# Patient Record
Sex: Female | Born: 1960 | Race: White | Hispanic: No | Marital: Married | Smoking: Former smoker
Health system: Southern US, Community
[De-identification: ages and names within clinical notes are randomized; demographics above are authoritative.]

## PROBLEM LIST (undated history)

## (undated) DIAGNOSIS — N920 Excessive and frequent menstruation with regular cycle: Secondary | ICD-10-CM

## (undated) DIAGNOSIS — J45909 Unspecified asthma, uncomplicated: Secondary | ICD-10-CM

## (undated) DIAGNOSIS — M810 Age-related osteoporosis without current pathological fracture: Secondary | ICD-10-CM

## (undated) DIAGNOSIS — I471 Supraventricular tachycardia, unspecified: Secondary | ICD-10-CM

## (undated) DIAGNOSIS — I480 Paroxysmal atrial fibrillation: Secondary | ICD-10-CM

## (undated) HISTORY — DX: Excessive and frequent menstruation with regular cycle: N92.0

## (undated) HISTORY — DX: Paroxysmal atrial fibrillation: I48.0

## (undated) HISTORY — DX: Supraventricular tachycardia: I47.1

## (undated) HISTORY — DX: Supraventricular tachycardia, unspecified: I47.10

## (undated) HISTORY — DX: Age-related osteoporosis without current pathological fracture: M81.0

## (undated) HISTORY — DX: Unspecified asthma, uncomplicated: J45.909

---

## 2000-08-29 ENCOUNTER — Other Ambulatory Visit: Admission: RE | Admit: 2000-08-29 | Discharge: 2000-08-29 | Payer: Self-pay | Admitting: Obstetrics and Gynecology

## 2002-09-09 ENCOUNTER — Other Ambulatory Visit: Admission: RE | Admit: 2002-09-09 | Discharge: 2002-09-09 | Payer: Self-pay | Admitting: Obstetrics and Gynecology

## 2005-01-11 ENCOUNTER — Emergency Department (HOSPITAL_COMMUNITY): Admission: RE | Admit: 2005-01-11 | Discharge: 2005-01-11 | Payer: Self-pay | Admitting: Family Medicine

## 2005-03-07 ENCOUNTER — Emergency Department (HOSPITAL_COMMUNITY): Admission: EM | Admit: 2005-03-07 | Discharge: 2005-03-07 | Payer: Self-pay | Admitting: Family Medicine

## 2005-05-15 ENCOUNTER — Other Ambulatory Visit: Admission: RE | Admit: 2005-05-15 | Discharge: 2005-05-15 | Payer: Self-pay | Admitting: Obstetrics and Gynecology

## 2006-05-16 ENCOUNTER — Other Ambulatory Visit: Admission: RE | Admit: 2006-05-16 | Discharge: 2006-05-16 | Payer: Self-pay | Admitting: Obstetrics and Gynecology

## 2007-11-13 ENCOUNTER — Other Ambulatory Visit: Admission: RE | Admit: 2007-11-13 | Discharge: 2007-11-13 | Payer: Self-pay | Admitting: Obstetrics and Gynecology

## 2009-07-06 ENCOUNTER — Other Ambulatory Visit: Admission: RE | Admit: 2009-07-06 | Discharge: 2009-07-06 | Payer: Self-pay | Admitting: Obstetrics and Gynecology

## 2009-07-06 ENCOUNTER — Ambulatory Visit: Payer: Self-pay | Admitting: Gynecology

## 2010-12-29 ENCOUNTER — Encounter (INDEPENDENT_AMBULATORY_CARE_PROVIDER_SITE_OTHER): Payer: BC Managed Care – PPO | Admitting: Women's Health

## 2010-12-29 DIAGNOSIS — N951 Menopausal and female climacteric states: Secondary | ICD-10-CM

## 2010-12-29 DIAGNOSIS — F329 Major depressive disorder, single episode, unspecified: Secondary | ICD-10-CM

## 2011-06-20 ENCOUNTER — Ambulatory Visit (INDEPENDENT_AMBULATORY_CARE_PROVIDER_SITE_OTHER): Payer: BC Managed Care – PPO | Admitting: Women's Health

## 2011-06-20 ENCOUNTER — Encounter: Payer: Self-pay | Admitting: Women's Health

## 2011-06-20 ENCOUNTER — Other Ambulatory Visit (HOSPITAL_COMMUNITY)
Admission: RE | Admit: 2011-06-20 | Discharge: 2011-06-20 | Disposition: A | Discharge status: Home / Self Care | Payer: BC Managed Care – PPO | Source: Ambulatory Visit | Attending: Women's Health | Admitting: Women's Health

## 2011-06-20 VITALS — BP 130/70 | Ht 66.0 in | Wt 163.0 lb

## 2011-06-20 DIAGNOSIS — R823 Hemoglobinuria: Secondary | ICD-10-CM

## 2011-06-20 DIAGNOSIS — E079 Disorder of thyroid, unspecified: Secondary | ICD-10-CM

## 2011-06-20 DIAGNOSIS — Z01419 Encounter for gynecological examination (general) (routine) without abnormal findings: Secondary | ICD-10-CM | POA: Insufficient documentation

## 2011-06-20 DIAGNOSIS — Z13220 Encounter for screening for lipoid disorders: Secondary | ICD-10-CM

## 2011-06-20 DIAGNOSIS — Z833 Family history of diabetes mellitus: Secondary | ICD-10-CM

## 2011-06-20 DIAGNOSIS — Z124 Encounter for screening for malignant neoplasm of cervix: Secondary | ICD-10-CM

## 2011-06-20 NOTE — Progress Notes (Signed)
Alexandra Grant [birth year] [id number]    History:    The patient presents for annual exam.  Mother had a stint put in her heart yesterday due to chest pains and blockage discharge home today and doing better.   Past medical history, past surgical history, family history and social history were all reviewed and documented in the EPIC chart.   ROS:  A  ROS was performed and pertinent positives and negatives are included in the history.  Exam:  Filed Vitals:   06/20/11 1446  BP: 130/70    General appearance:  Normal Head/Neck:  Normal, without cervical or supraclavicular adenopathy. Thyroid:  Symmetrical, normal in size, without palpable masses or nodularity. Respiratory  Effort:  Normal  Auscultation:  Clear without wheezing or rhonchi Cardiovascular  Auscultation:  Regular rate, without rubs, murmurs or gallops  Edema/varicosities:  Not grossly evident Abdominal  Soft,nontender, without masses, guarding or rebound.  Liver/spleen:  No organomegaly noted  Hernia:  None appreciated  Skin  Inspection:  Grossly normal  Palpation:  Grossly normal Neurologic/psychiatric  Orientation:  Normal with appropriate conversation.  Mood/affect:  Normal  Genitourinary    Breasts: Examined lying and sitting.     Right: Without masses, retractions, discharge or axillary adenopathy.     Left: Without masses, retractions, discharge or axillary adenopathy.   Inguinal/mons:  Normal without inguinal adenopathy  External genitalia:  Normal  BUS/Urethra/Skene's glands:  Normal  Bladder:  Normal  Vagina:  Normal  Cervix:  Normal  Uterus:   normal in size, shape and contour.  Midline and mobile  Adnexa/parametria:     Rt: Without masses or tenderness.   Lt: Without masses or tenderness.  Anus and perineum: Normal  Digital rectal exam: Normal sphincter tone without palpated masses or tenderness  Assessment/Plan:  50 y.o. MWF G2P2 for annual exam mostly monthly 5 day cycles/vasectomy.  States  has had some irregular cycles but unable to define, instructed to keep a menstrual calendar. Last mammogram approximately 5 years ago she states normal did review importance of annual screening. Has had some problems with anxiety and panic this past year. States drinks alcohol most days to help her relax.  Uses Xanax 0.25 sparingly has used only one prescription in the last 6 months.   Perimenopausal Anxiety/ depression/questionable alcohol abuse  Plan: Menstrual calendar, call if cycles are greater than 60 days or less than 21 days or any bleeding between cycles. Marland Kitchen SBEs, annual mammogram instructed to schedule. Counseling for anxiety and depression recommended, Berniece Andreas name and number was given for counseling or. Encouraged increase leisure and continue exercise. Xanax 0.25 every 8 hours when necessary #30 no refills, reviewed importance of using sparingly, it is addictive. Reviewed risks of daily drinking. Vitamin D 1000 daily and calcium rich diet encouraged.  CBC, glucose, lipid profile, UA, Pap. Harrington Challenger [facility], 3:27 PM 06/20/2011

## 2011-06-28 ENCOUNTER — Other Ambulatory Visit: Payer: Self-pay | Admitting: Gynecology

## 2011-06-28 DIAGNOSIS — E039 Hypothyroidism, unspecified: Secondary | ICD-10-CM

## 2011-06-29 LAB — TSH: TSH: 0.519 u[IU]/mL (ref 0.350–4.500)

## 2012-05-30 ENCOUNTER — Telehealth: Payer: Self-pay | Admitting: *Deleted

## 2012-05-30 MED ORDER — ALPRAZOLAM 0.25 MG PO TABS: 0.2500 mg | ORAL_TABLET | ORAL | Status: DC | PRN

## 2012-05-30 NOTE — Telephone Encounter (Signed)
Pt called requesting new Rx for Xanax 0.25 mg given last year at annual. Okay to fill?

## 2012-05-30 NOTE — Telephone Encounter (Signed)
Pt informed rx called in

## 2012-05-30 NOTE — Telephone Encounter (Signed)
Please call in Xanax 0.25 when necessary #30, 1 refill. Thanks

## 2012-07-18 ENCOUNTER — Ambulatory Visit (INDEPENDENT_AMBULATORY_CARE_PROVIDER_SITE_OTHER): Payer: BC Managed Care – PPO | Admitting: Gynecology

## 2012-07-18 ENCOUNTER — Encounter: Payer: Self-pay | Admitting: Gynecology

## 2012-07-18 VITALS — BP 128/80

## 2012-07-18 DIAGNOSIS — N390 Urinary tract infection, site not specified: Secondary | ICD-10-CM

## 2012-07-18 DIAGNOSIS — R300 Dysuria: Secondary | ICD-10-CM

## 2012-07-18 LAB — URINALYSIS W MICROSCOPIC + REFLEX CULTURE
Bilirubin Urine: NEGATIVE
Casts: NONE SEEN
Crystals: NONE SEEN
Glucose, UA: NEGATIVE mg/dL
Ketones, ur: NEGATIVE mg/dL
Nitrite: NEGATIVE
Protein, ur: NEGATIVE mg/dL
Specific Gravity, Urine: 1.01 (ref 1.005–1.030)
Urobilinogen, UA: 0.2 mg/dL (ref 0.0–1.0)
pH: 5.5 (ref 5.0–8.0)

## 2012-07-18 MED ORDER — NITROFURANTOIN MONOHYD MACRO 100 MG PO CAPS: 100.0000 mg | ORAL_CAPSULE | Freq: Two times a day (BID) | ORAL | Status: DC

## 2012-07-18 NOTE — Patient Instructions (Addendum)
Urinary Tract Infection Urinary tract infections (UTIs) can develop anywhere along your urinary tract. Your urinary tract is your body's drainage system for removing wastes and extra water. Your urinary tract includes two kidneys, two ureters, a bladder, and a urethra. Your kidneys are a pair of bean-shaped organs. Each kidney is about the size of your fist. They are located below your ribs, one on each side of your spine. CAUSES Infections are caused by microbes, which are microscopic organisms, including fungi, viruses, and bacteria. These organisms are so small that they can only be seen through a microscope. Bacteria are the microbes that most commonly cause UTIs. SYMPTOMS  Symptoms of UTIs may vary by age and gender of the patient and by the location of the infection. Symptoms in young women typically include a frequent and intense urge to urinate and a painful, burning feeling in the bladder or urethra during urination. Older women and men are more likely to be tired, shaky, and weak and have muscle aches and abdominal pain. A fever may mean the infection is in your kidneys. Other symptoms of a kidney infection include pain in your back or sides below the ribs, nausea, and vomiting. DIAGNOSIS To diagnose a UTI, your caregiver will ask you about your symptoms. Your caregiver also will ask to provide a urine sample. The urine sample will be tested for bacteria and white blood cells. White blood cells are made by your body to help fight infection. TREATMENT  Typically, UTIs can be treated with medication. Because most UTIs are caused by a bacterial infection, they usually can be treated with the use of antibiotics. The choice of antibiotic and length of treatment depend on your symptoms and the type of bacteria causing your infection. HOME CARE INSTRUCTIONS  If you were prescribed antibiotics, take them exactly as your caregiver instructs you. Finish the medication even if you feel better after you  have only taken some of the medication.  Drink enough water and fluids to keep your urine clear or pale yellow.  Avoid caffeine, tea, and carbonated beverages. They tend to irritate your bladder.  Empty your bladder often. Avoid holding urine for long periods of time.  Empty your bladder before and after sexual intercourse.  After a bowel movement, women should cleanse from front to back. Use each tissue only once. SEEK MEDICAL CARE IF:   You have back pain.  You develop a fever.  Your symptoms do not begin to resolve within 3 days. SEEK IMMEDIATE MEDICAL CARE IF:   You have severe back pain or lower abdominal pain.  You develop chills.  You have nausea or vomiting.  You have continued burning or discomfort with urination. MAKE SURE YOU:   Understand these instructions.  Will watch your condition.  Will get help right away if you are not doing well or get worse. Document Released: 05/09/2005 Document Revised: 01/29/2012 Document Reviewed: 09/07/2011 [employer] Patient Information 2013 [employer].  

## 2012-07-18 NOTE — Progress Notes (Signed)
Patient presented to the office today complaining of a few days of dysuria frequency. Patient denied fever chills nausea or vomiting. Patient denied vaginal discharge.  Exam: Back: No CVA tenderness Abdomen: Soft nontender no rebound or guarding Pelvic not done  Urinalysis demonstrated 21-15 white blood cells and clumps, few bacteria, 3-6 rbc's.  Assessment/plan: Clinical evidence of urinary tract infection. Patient will be started on Macrobid one by mouth twice a day for 7 days. She was also given samples of Uribell anti-spasmodic agent to take 1 by mouth 4 times a day for 2 days. She is encouraged to increase her fluid intake. If she develops any back pain fever chills nausea vomiting months after hours she will report to the emergency room.

## 2012-07-22 LAB — URINE CULTURE

## 2012-07-23 ENCOUNTER — Other Ambulatory Visit: Payer: Self-pay | Admitting: Dermatology

## 2012-07-29 ENCOUNTER — Encounter: Payer: Self-pay | Admitting: Women's Health

## 2012-08-13 DIAGNOSIS — N920 Excessive and frequent menstruation with regular cycle: Secondary | ICD-10-CM

## 2012-08-13 HISTORY — DX: Excessive and frequent menstruation with regular cycle: N92.0

## 2012-09-01 ENCOUNTER — Encounter: Payer: BC Managed Care – PPO | Admitting: Women's Health

## 2012-09-19 ENCOUNTER — Encounter: Payer: Self-pay | Admitting: Women's Health

## 2012-09-19 ENCOUNTER — Ambulatory Visit (INDEPENDENT_AMBULATORY_CARE_PROVIDER_SITE_OTHER): Payer: BC Managed Care – PPO | Admitting: Women's Health

## 2012-09-19 VITALS — BP 124/80 | Ht 66.0 in | Wt 159.0 lb

## 2012-09-19 DIAGNOSIS — E079 Disorder of thyroid, unspecified: Secondary | ICD-10-CM

## 2012-09-19 DIAGNOSIS — Z833 Family history of diabetes mellitus: Secondary | ICD-10-CM

## 2012-09-19 DIAGNOSIS — F419 Anxiety disorder, unspecified: Secondary | ICD-10-CM

## 2012-09-19 DIAGNOSIS — F411 Generalized anxiety disorder: Secondary | ICD-10-CM

## 2012-09-19 DIAGNOSIS — Z01419 Encounter for gynecological examination (general) (routine) without abnormal findings: Secondary | ICD-10-CM

## 2012-09-19 MED ORDER — ALPRAZOLAM 0.25 MG PO TABS: 0.2500 mg | ORAL_TABLET | Freq: Every evening | ORAL | Status: DC | PRN

## 2012-09-19 NOTE — Patient Instructions (Signed)

## 2012-09-19 NOTE — Progress Notes (Signed)
Karly ZALMA CHANNING 1961/02/19 [id number]    History:    The patient presents for annual exam.  Regular monthly menses/vasectomy. Normal PAPs and mammograms.     Past medical history, past surgical history, family history and social history were all reviewed and documented in the EPIC chart. Hx Anxiety, Xanax PRN. Heart Disease, HTN- M, MGM. Osteoporosis - M. 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 Daughters, [place] 24 and Hannah 23.   ROS:  A  ROS was performed and pertinent positives and negatives are included in the history.  Exam:  Filed Vitals:   09/19/12 1412  BP: 124/80    General appearance:  Normal Head/Neck:  Normal, without cervical or supraclavicular adenopathy. Thyroid:  Symmetrical, normal in size, without palpable masses or nodularity. Respiratory  Effort:  Normal  Auscultation:  Clear without wheezing or rhonchi Cardiovascular  Auscultation:  Regular rate, without rubs, murmurs or gallops  Edema/varicosities:  Not grossly evident Abdominal  Soft,nontender, without masses, guarding or rebound.  Liver/spleen:  No organomegaly noted  Hernia:  None appreciated  Skin  Inspection:  Grossly normal  Palpation:  Grossly normal Neurologic/psychiatric  Orientation:  Normal with appropriate conversation.  Mood/affect:  Normal  Genitourinary    Breasts: Examined lying and sitting.     Right: Without masses, retractions, discharge or axillary adenopathy.     Left: Without masses, retractions, discharge or axillary adenopathy.   Inguinal/mons:  Normal without inguinal adenopathy  External genitalia:  Normal  BUS/Urethra/Skene's glands:  Normal  Bladder:  Normal  Vagina:  Normal  Cervix:  Normal  Uterus:  normal in size, shape and contour.  Midline and mobile  Adnexa/parametria:     Rt: Without masses or tenderness.   Lt: Without masses or tenderness.  Anus and perineum: Normal  Digital rectal exam: Normal sphincter tone without palpated masses or tenderness  Assessment/Plan:  52 y.o.  M WF G2 P2 for annual exam.     Normal GYN exam/vasectomy  Situational anxiety   Plan: Xanax .25 mg/ every 8 hours when necessary aware to use sparingly addictive properties reviewed. Denies need for counseling. Normal PAPs, last Nov 12, new guidelines reviewed. Discussed importance of SBEs, annual mammogram, scheduling colonoscopy, healthy diet, exercise, Calcium, 2000 U Vitamin D. Symptoms of menopause reviewed. CBC, TSH, glucose, UA.      Harrington Challenger [facility]NP, 2:42 PM 09/19/2012

## 2012-09-20 LAB — URINALYSIS W MICROSCOPIC + REFLEX CULTURE
Bacteria, UA: NONE SEEN
Bilirubin Urine: NEGATIVE
Casts: NONE SEEN
Glucose, UA: NEGATIVE mg/dL
Hgb urine dipstick: NEGATIVE
Ketones, ur: NEGATIVE mg/dL
Leukocytes, UA: NEGATIVE
Nitrite: NEGATIVE
Protein, ur: NEGATIVE mg/dL
Specific Gravity, Urine: 1.021 (ref 1.005–1.030)
Squamous Epithelial / LPF: NONE SEEN
Urobilinogen, UA: 0.2 mg/dL (ref 0.0–1.0)
pH: 5.5 (ref 5.0–8.0)

## 2013-04-15 ENCOUNTER — Ambulatory Visit (INDEPENDENT_AMBULATORY_CARE_PROVIDER_SITE_OTHER): Payer: BC Managed Care – PPO | Admitting: Women's Health

## 2013-04-15 ENCOUNTER — Encounter: Payer: Self-pay | Admitting: Women's Health

## 2013-04-15 DIAGNOSIS — N925 Other specified irregular menstruation: Secondary | ICD-10-CM

## 2013-04-15 DIAGNOSIS — F17210 Nicotine dependence, cigarettes, uncomplicated: Secondary | ICD-10-CM | POA: Insufficient documentation

## 2013-04-15 DIAGNOSIS — N938 Other specified abnormal uterine and vaginal bleeding: Secondary | ICD-10-CM

## 2013-04-15 DIAGNOSIS — F411 Generalized anxiety disorder: Secondary | ICD-10-CM

## 2013-04-15 DIAGNOSIS — N949 Unspecified condition associated with female genital organs and menstrual cycle: Secondary | ICD-10-CM

## 2013-04-15 DIAGNOSIS — F17200 Nicotine dependence, unspecified, uncomplicated: Secondary | ICD-10-CM

## 2013-04-15 LAB — TSH: TSH: 0.687 u[IU]/mL (ref 0.350–4.500)

## 2013-04-15 LAB — PREGNANCY, URINE: Preg Test, Ur: NEGATIVE

## 2013-04-15 MED ORDER — MEGESTROL ACETATE 40 MG PO TABS: 40.0000 mg | ORAL_TABLET | Freq: Two times a day (BID) | ORAL | Status: DC

## 2013-04-15 MED ORDER — ALPRAZOLAM 0.25 MG PO TABS: 0.2500 mg | ORAL_TABLET | ORAL | Status: DC | PRN

## 2013-04-15 NOTE — Patient Instructions (Signed)

## 2013-04-15 NOTE — Progress Notes (Signed)
Patient ID: Alexandra Grant, female   DOB: 06/11/1961, 52 y.o.   MRN: [id number] Presents with complaint of menstrual cycle lasting 6 weeks. Most days 4-6 tampons  per day, lightist day 2 tampons per day, no days with no bleeding. Monthly cycle for 6 days prior, over the past year cycles have become heavier with increased clots. Vasectomy. Denies discharge, urinary symptoms, abdominal pain or fever. Smoker. Situational stressors with daughter not always making good choices.  Exam: Appears well, external genitalia within normal limits, speculum exam moderate amount of menses type blood noted cervix without visible lesion or polyp. Bimanual no CMT or adnexal fullness or tenderness.  DUB  Plan: U PT, TSH, prolactin, Megace 40 mg twice daily for 10 days, instructed to call if bleeding does not stop. Sonohysterogram with Dr. Lily Peer after bleeding stops.

## 2013-04-16 LAB — PROLACTIN: Prolactin: 12.8 ng/mL

## 2013-04-22 ENCOUNTER — Telehealth: Payer: Self-pay | Admitting: *Deleted

## 2013-04-22 NOTE — Telephone Encounter (Signed)
Pt is scheduled for SHGM on 04/27/13 given megace twice daily x 10 days on OV 04/15/13, not bleeding now. She asked if you think she should refill megace to take for Sunday because on saturday she will run out of the above? Please advise

## 2013-04-22 NOTE — Telephone Encounter (Signed)
Please call, instruct to take one daily starting Friday probably will have no bleeding.

## 2013-04-23 NOTE — Telephone Encounter (Signed)
Left the below note on pt voicemail. 

## 2013-04-27 ENCOUNTER — Other Ambulatory Visit: Payer: Self-pay | Admitting: Women's Health

## 2013-04-27 ENCOUNTER — Other Ambulatory Visit: Payer: Self-pay | Admitting: Gynecology

## 2013-04-27 ENCOUNTER — Ambulatory Visit (INDEPENDENT_AMBULATORY_CARE_PROVIDER_SITE_OTHER): Payer: BC Managed Care – PPO

## 2013-04-27 ENCOUNTER — Ambulatory Visit (INDEPENDENT_AMBULATORY_CARE_PROVIDER_SITE_OTHER): Payer: BC Managed Care – PPO | Admitting: Gynecology

## 2013-04-27 DIAGNOSIS — N949 Unspecified condition associated with female genital organs and menstrual cycle: Secondary | ICD-10-CM

## 2013-04-27 DIAGNOSIS — N938 Other specified abnormal uterine and vaginal bleeding: Secondary | ICD-10-CM

## 2013-04-27 DIAGNOSIS — N83209 Unspecified ovarian cyst, unspecified side: Secondary | ICD-10-CM

## 2013-04-27 DIAGNOSIS — N925 Other specified irregular menstruation: Secondary | ICD-10-CM

## 2013-04-27 DIAGNOSIS — N83201 Unspecified ovarian cyst, right side: Secondary | ICD-10-CM

## 2013-04-27 DIAGNOSIS — N951 Menopausal and female climacteric states: Secondary | ICD-10-CM

## 2013-04-27 LAB — CBC WITH DIFFERENTIAL/PLATELET
Basophils Absolute: 0 10*3/uL (ref 0.0–0.1)
Basophils Relative: 1 % (ref 0–1)
Eosinophils Absolute: 0.2 10*3/uL (ref 0.0–0.7)
Eosinophils Relative: 4 % (ref 0–5)
HCT: 40.3 % (ref 36.0–46.0)
Hemoglobin: 14.1 g/dL (ref 12.0–15.0)
Lymphocytes Relative: 28 % (ref 12–46)
Lymphs Abs: 1.4 10*3/uL (ref 0.7–4.0)
MCH: 32.2 pg (ref 26.0–34.0)
MCHC: 35 g/dL (ref 30.0–36.0)
MCV: 92 fL (ref 78.0–100.0)
Monocytes Absolute: 0.5 10*3/uL (ref 0.1–1.0)
Monocytes Relative: 10 % (ref 3–12)
Neutro Abs: 3 10*3/uL (ref 1.7–7.7)
Neutrophils Relative %: 57 % (ref 43–77)
Platelets: 357 10*3/uL (ref 150–400)
RBC: 4.38 MIL/uL (ref 3.87–5.11)
RDW: 12.4 % (ref 11.5–15.5)
WBC: 5.2 10*3/uL (ref 4.0–10.5)

## 2013-04-27 MED ORDER — MEDROXYPROGESTERONE ACETATE 150 MG/ML IM SUSP: 150.0000 mg | Freq: Once | INTRAMUSCULAR | Status: DC

## 2013-04-27 NOTE — Patient Instructions (Signed)
Endometrial Ablation Endometrial ablation removes the lining of the uterus (endometrium). It is usually a same day, outpatient treatment. Ablation helps avoid major surgery (such as a hysterectomy). A hysterectomy is removal of the cervix and uterus. Endometrial ablation has less risk and complications, has a shorter recovery period and is less expensive. After endometrial ablation, most women will have little or no menstrual bleeding. You may not keep your fertility. Pregnancy is no longer likely after this procedure but if you are pre-menopausal, you still need to use a reliable method of birth control following the procedure because pregnancy can occur. REASONS TO HAVE THE PROCEDURE MAY INCLUDE:  Heavy periods.  Bleeding that is causing anemia.  Anovulatory bleeding, very irregular, bleeding.  Bleeding submucous fibroids (on the lining inside the uterus) if they are smaller than 3 centimeters. REASONS NOT TO HAVE THE PROCEDURE MAY INCLUDE:  You wish to have more children.  You have a pre-cancerous or cancerous problem. The cause of any abnormal bleeding must be diagnosed before having the procedure.  You have pain coming from the uterus.  You have a submucus fibroid larger than 3 centimeters.  You recently had a baby.  You recently had an infection in the uterus.  You have a severe retro-flexed, tipped uterus and cannot insert the instrument to do the ablation.  You had a Cesarean section or deep major surgery on the uterus.  The inner cavity of the uterus is too large for the endometrial ablation instrument. RISKS AND COMPLICATIONS   Perforation of the uterus.  Bleeding.  Infection of the uterus, bladder or vagina.  Injury to surrounding organs.  Cutting the cervix.  An air bubble to the lung (air embolus).  Pregnancy following the procedure.  Failure of the procedure to help the problem requiring hysterectomy.  Decreased ability to diagnose cancer in the lining of  the uterus. BEFORE THE PROCEDURE  The lining of the uterus must be tested to make sure there is no pre-cancerous or cancer cells present.  Medications may be given to make the lining of the uterus thinner.  Ultrasound may be used to evaluate the size and look for abnormalities of the uterus.  Future pregnancy is not desired. PROCEDURE  There are different ways to destroy the lining of the uterus.   Resectoscope - radio frequency-alternating electric current is the most common one used.  Cryotherapy - freezing the lining of the uterus.  Heated Free Liquid - heated salt (saline) solution inserted into the uterus.  Microwave - uses high energy microwaves in the uterus.  Thermal Balloon - a catheter with a balloon tip is inserted into the uterus and filled with heated fluid. Your caregiver will talk with you about the method used in this clinic. They will also instruct you on the pros and cons of the procedure. Endometrial ablation is performed along with a procedure called operative hysteroscopy. A narrow viewing tube is inserted through the birth canal (vagina) and through the cervix into the uterus. A tiny camera attached to the viewing tube (hysteroscope) allows the uterine cavity to be shown on a TV monitor during surgery. Your uterus is filled with a harmless liquid to make the procedure easier. The lining of the uterus is then removed. The lining can also be removed with a resectoscope which allows your surgeon to cut away the lining of the uterus under direct vision. Usually, you will be able to go home within an hour after the procedure. HOME CARE INSTRUCTIONS   Do   not drive for 24 hours.  No tampons, douching or intercourse for 2 weeks or until your caregiver approves.  Rest at home for 24 to 48 hours. You may then resume normal activities unless told differently by your caregiver.  Take your temperature two times a day for 4 days, and record it.  Take any medications your  caregiver has ordered, as directed.  Use some form of contraception if you are pre-menopausal and do not want to get pregnant. Bleeding after the procedure is normal. It varies from light spotting and mildly watery to bloody discharge for 4 to 6 weeks. You may also have mild cramping. Only take over-the-counter or prescription medicines for pain, discomfort, or fever as directed by your caregiver. Do not use aspirin, as this may aggravate bleeding. Frequent urination during the first 24 hours is normal. You will not know how effective your surgery is until at least 3 months after the surgery. SEEK IMMEDIATE MEDICAL CARE IF:   Bleeding is heavier than a normal menstrual cycle.  An oral temperature above 102 F (38.9 C) develops.  You have increasing cramps or pains not relieved with medication or develop belly (abdominal) pain which does not seem to be related to the same area of earlier cramping and pain.  You are light headed, weak or have fainting episodes.  You develop pain in the shoulder strap areas.  You have chest or leg pain.  You have abnormal vaginal discharge.  You have painful urination. Document Released: 06/08/2004 Document Revised: 10/22/2011 Document Reviewed: 09/06/2007 [facility] Patient Information 2014 [place], [place]. Ovarian Cyst The ovaries are small organs that are on each side of the uterus. The ovaries are the organs that produce the female hormones, estrogen and progesterone. An ovarian cyst is a sac filled with fluid that can vary in its size. It is normal for a small cyst to form in women who are in the childbearing age and who have menstrual periods. This type of cyst is called a follicle cyst that becomes an ovulation cyst (corpus luteum cyst) after it produces the women's egg. It later goes away on its own if the woman does not become pregnant. There are other kinds of ovarian cysts that may cause problems and may need to be treated. The most serious problem is  a cyst with cancer. It should be noted that menopausal women who have an ovarian cyst are at a higher risk of it being a cancer cyst. They should be evaluated very quickly, thoroughly and followed closely. This is especially true in menopausal women because of the high rate of ovarian cancer in women in menopause. CAUSES AND TYPES OF OVARIAN CYSTS:  FUNCTIONAL CYST: The follicle/corpus luteum cyst is a functional cyst that occurs every month during ovulation with the menstrual cycle. They go away with the next menstrual cycle if the woman does not get pregnant. Usually, there are no symptoms with a functional cyst.  ENDOMETRIOMA CYST: This cyst develops from the lining of the uterus tissue. This cyst gets in or on the ovary. It grows every month from the bleeding during the menstrual period. It is also called a "chocolate cyst" because it becomes filled with blood that turns brown. This cyst can cause pain in the lower abdomen during intercourse and with your menstrual period.  CYSTADENOMA CYST: This cyst develops from the cells on the outside of the ovary. They usually are not cancerous. They can get very big and cause lower abdomen pain and pain with intercourse.  This type of cyst can twist on itself, cut off its blood supply and cause severe pain. It also can easily rupture and cause a lot of pain.  DERMOID CYST: This type of cyst is sometimes found in both ovaries. They are found to have different kinds of body tissue in the cyst. The tissue includes skin, teeth, hair, and/or cartilage. They usually do not have symptoms unless they get very big. Dermoid cysts are rarely cancerous.  POLYCYSTIC OVARY: This is a rare condition with hormone problems that produces many small cysts on both ovaries. The cysts are follicle-like cysts that never produce an egg and become a corpus luteum. It can cause an increase in body weight, infertility, acne, increase in body and facial hair and lack of menstrual periods  or rare menstrual periods. Many women with this problem develop type 2 diabetes. The exact cause of this problem is unknown. A polycystic ovary is rarely cancerous.  THECA LUTEIN CYST: Occurs when too much hormone (human chorionic gonadotropin) is produced and over-stimulates the ovaries to produce an egg. They are frequently seen when doctors stimulate the ovaries for invitro-fertilization (test tube babies).  LUTEOMA CYST: This cyst is seen during pregnancy. Rarely it can cause an obstruction to the birth canal during labor and delivery. They usually go away after delivery. SYMPTOMS   Pelvic pain or pressure.  Pain during sexual intercourse.  Increasing girth (swelling) of the abdomen.  Abnormal menstrual periods.  Increasing pain with menstrual periods.  You stop having menstrual periods and you are not pregnant. DIAGNOSIS  The diagnosis can be made during:  Routine or annual pelvic examination (common).  Ultrasound.  X-ray of the pelvis.  CT Scan.  MRI.  Blood tests. TREATMENT   Treatment may only be to follow the cyst monthly for 2 to 3 months with your caregiver. Many go away on their own, especially functional cysts.  May be aspirated (drained) with a long needle with ultrasound, or by laparoscopy (inserting a tube into the pelvis through a small incision).  The whole cyst can be removed by laparoscopy.  Sometimes the cyst may need to be removed through an incision in the lower abdomen.  Hormone treatment is sometimes used to help dissolve certain cysts.  Birth control pills are sometimes used to help dissolve certain cysts. HOME CARE INSTRUCTIONS  Follow your caregiver's advice regarding:  Medicine.  Follow up visits to evaluate and treat the cyst.  You may need to come back or make an appointment with another caregiver, to find the exact cause of your cyst, if your caregiver is not a gynecologist.  Get your yearly and recommended pelvic examinations and  Pap tests.  Let your caregiver know if you have had an ovarian cyst in the past. SEEK MEDICAL CARE IF:   Your periods are late, irregular, they stop, or are painful.  Your stomach (abdomen) or pelvic pain does not go away.  Your stomach becomes larger or swollen.  You have pressure on your bladder or trouble emptying your bladder completely.  You have painful sexual intercourse.  You have feelings of fullness, pressure, or discomfort in your stomach.  You lose weight for no apparent reason.  You feel generally ill.  You become constipated.  You lose your appetite.  You develop acne.  You have an increase in body and facial hair.  You are gaining weight, without changing your exercise and eating habits.  You think you are pregnant. SEEK IMMEDIATE MEDICAL CARE IF:  You have increasing abdominal pain.  You feel sick to your stomach (nausea) and/or vomit.  You develop a fever that comes on suddenly.  You develop abdominal pain during a bowel movement.  Your menstrual periods become heavier than usual. Document Released: 07/30/2005 Document Revised: 10/22/2011 Document Reviewed: 06/02/2009 [facility] Patient Information 2014 [place], [place].

## 2013-04-27 NOTE — Progress Notes (Signed)
The patient is a 52 year old who was seen in the office on September 3 by Maryelizabeth Rowan NP with a complaint of menstrual cycle lasting for 6 weeks. Patient stated that she was using an average of 4-6 tampons per day and umbilicus day she was weighing 2 tampons per day. No days were free of any bleeding. Prior to this event her cycles lasted for 6 days. She did report that her cycles have become heavier with clots over the past year. Her husband has had a vasectomy. Patient recently had a normal TSH and prolactin and a negative urine pregnancy test. She presented today for further evaluation such as with a sonohysterogram.  Ultrasound today: Uterus measures 9.6 x 6.8 x 5.6 cm with endometrial stripe of 6.6 mm. Heterogeneous echo pattern noted with cortical cyst in the endometrium. Right ovary there was a thin wall echo for a cyst 28 x 32 x 19 mm (average 26 mm) avascular. Left ovary normal. Endometrial and prominent vascular fluid seen in the cavity. So infusion histogram were no defects noted.  The cervix had been cleansed with Betadine solution and a Pipelle had previously been introduced to obtain tissue which was negative histological evaluation  Assessment/plan: Patient with episode of menorrhagia perimenopausal. Patient was normal TSH and prolactin. Patient with negative urine pregnancy test husband with vasectomy. We'll check a CBC today. Patient had been on Megace 40 mg twice a day for one week which she just finished. She will receive Depo-Provera 150 mg IM today. She'll return back to the office in 3 months for followup ultrasound. She will maintain a log of her menstrual pattern over the course of the next 3 months. We had discussed alternative treatment options such as endometrial ablation or progesterone only IUD. Patient is a smoker.

## 2013-06-18 ENCOUNTER — Other Ambulatory Visit: Payer: Self-pay

## 2013-07-07 ENCOUNTER — Ambulatory Visit: Payer: BC Managed Care – PPO | Admitting: Women's Health

## 2013-07-22 ENCOUNTER — Telehealth: Payer: Self-pay | Admitting: *Deleted

## 2013-07-22 DIAGNOSIS — N938 Other specified abnormal uterine and vaginal bleeding: Secondary | ICD-10-CM

## 2013-07-22 MED ORDER — MEGESTROL ACETATE 40 MG PO TABS: 40.0000 mg | ORAL_TABLET | Freq: Two times a day (BID) | ORAL | Status: DC

## 2013-07-22 NOTE — Telephone Encounter (Signed)
Rx sent, pt informed with all the below.

## 2013-07-22 NOTE — Telephone Encounter (Signed)
Tell patient that I reviewed her past blood work an endometrial biopsy and sonohysterogram which were all benign. She had received Depo-Provera 150 mg IM which probably she is wearing off now. You can call her a  prescription for Megace 40 mg twice a day to carry her for the next 2 weeks while we wait for her followup ultrasound.

## 2013-07-22 NOTE — Telephone Encounter (Signed)
Pt called c/o vaginal bleeding, had SHGM in sept. Scheduled for vaginal ultrasound on 07/27/13. Pt said about 3 weeks after SHGM in sept. She started back bleeding heavy, took some left over megace then it stopped. Bleeding started back not has heavy but a flow,pt asked if you want her to take more megace to stop bleeding due to ultrasound being next week? Please advise

## 2013-07-27 ENCOUNTER — Other Ambulatory Visit: Payer: Self-pay | Admitting: Gynecology

## 2013-07-27 ENCOUNTER — Encounter: Payer: Self-pay | Admitting: Gynecology

## 2013-07-27 ENCOUNTER — Ambulatory Visit (INDEPENDENT_AMBULATORY_CARE_PROVIDER_SITE_OTHER): Payer: BC Managed Care – PPO | Admitting: Gynecology

## 2013-07-27 ENCOUNTER — Ambulatory Visit (INDEPENDENT_AMBULATORY_CARE_PROVIDER_SITE_OTHER): Payer: BC Managed Care – PPO

## 2013-07-27 DIAGNOSIS — N949 Unspecified condition associated with female genital organs and menstrual cycle: Secondary | ICD-10-CM

## 2013-07-27 DIAGNOSIS — N83201 Unspecified ovarian cyst, right side: Secondary | ICD-10-CM

## 2013-07-27 DIAGNOSIS — N8300 Follicular cyst of ovary, unspecified side: Secondary | ICD-10-CM

## 2013-07-27 DIAGNOSIS — N938 Other specified abnormal uterine and vaginal bleeding: Secondary | ICD-10-CM

## 2013-07-27 DIAGNOSIS — N925 Other specified irregular menstruation: Secondary | ICD-10-CM

## 2013-07-27 DIAGNOSIS — N951 Menopausal and female climacteric states: Secondary | ICD-10-CM

## 2013-07-27 DIAGNOSIS — N83209 Unspecified ovarian cyst, unspecified side: Secondary | ICD-10-CM

## 2013-07-27 LAB — CBC WITH DIFFERENTIAL/PLATELET
Basophils Absolute: 0 10*3/uL (ref 0.0–0.1)
Basophils Relative: 1 % (ref 0–1)
Eosinophils Absolute: 0.2 10*3/uL (ref 0.0–0.7)
Eosinophils Relative: 3 % (ref 0–5)
HCT: 45.5 % (ref 36.0–46.0)
Hemoglobin: 15.5 g/dL — ABNORMAL HIGH (ref 12.0–15.0)
Lymphocytes Relative: 28 % (ref 12–46)
Lymphs Abs: 1.5 10*3/uL (ref 0.7–4.0)
MCH: 31.4 pg (ref 26.0–34.0)
MCHC: 34.1 g/dL (ref 30.0–36.0)
MCV: 92.1 fL (ref 78.0–100.0)
Monocytes Absolute: 0.5 10*3/uL (ref 0.1–1.0)
Monocytes Relative: 10 % (ref 3–12)
Neutro Abs: 3 10*3/uL (ref 1.7–7.7)
Neutrophils Relative %: 58 % (ref 43–77)
Platelets: 304 10*3/uL (ref 150–400)
RBC: 4.94 MIL/uL (ref 3.87–5.11)
RDW: 13 % (ref 11.5–15.5)
WBC: 5.2 10*3/uL (ref 4.0–10.5)

## 2013-07-27 MED ORDER — TRANEXAMIC ACID 650 MG PO TABS: 1300.0000 mg | ORAL_TABLET | Freq: Three times a day (TID) | ORAL | Status: DC

## 2013-07-27 NOTE — Progress Notes (Addendum)
Patient presented to the office today for an ultrasound to followup on ovarian cysts detected on 04/27/2013 when patient presented to the office complaining of heavy periods. Her husband has had a vasectomy. She previously had a normal TSH and prolactin. The ultrasound on September 15 demonstrated the following:  Uterus measures 9.6 x 6.8 x 5.6 cm with endometrial stripe of 6.6 mm. Heterogeneous echo pattern noted with cortical cyst in the endometrium. Right ovary there was a thin wall echo for a cyst 28 x 32 x 19 mm (average 26 mm) avascular. Left ovary normal. Endometrial and prominent vascular fluid seen in the cavity. So infusion histogram were no defects noted.  She also had an endometrial biopsy with the following results: Diagnosis Endometrium, biopsy, uterus - BENIGN POLYPOID ENDOMETRIUM, SEE COMMENT. - NO HYPERPLASIA, ATYPIA, OR MALIGNANCY  She received Depo-Provera 150 mg on that same date and was instructed to return today for an ultrasound which he did and the following were the findings:  Uterus measured 9.8 x 7.1 x 5.1 cm with endometrial stripe of 3.5 mm. She had a small intramural fibroid measuring 11 mm. Right ovarian follicle measured 13 mm. The previously seen large ovarian cyst resolved. Left ovary was normal with a small follicle. No fluid in the cul-de-sac.  Patient previously had been provided with  literature information on endometrial ablation via her option technique (husband has had a vasectomy) as well as the Mirena IUD.  Assessment/plan: Perimenopausal dysfunctional  uterine bleeding with benign endometrial biopsy as well as normal TSH and prolactin. A CBC, FSH and von Willebrand panel will be drawn today. Patient once again was provided with literature information of the Mirena  IUD  And Her option endometrial ablation. She is going to think about it and let us know. She  Is currently wearing off the effects of the Depo-Provera injection as well at the resolution of the  ovarian cyst so her bleeding should continue to be tapering off. She will contact us to decide which route she would like to proceed. I explained to her that the hysterectomy would be the last option although she is not interested in hysterectomy. She is a chronic smoker so low-dose oral contraceptive pill would be contraindicated. Patient had a normal Pap smear 2004. I have offered her and prescribed Lysteda 650 mg 2 tablets 3 times a day for 5 days during her menses to cut down on the amount of bleeding.

## 2013-07-27 NOTE — Patient Instructions (Signed)
Tranexamic acid oral tablets What is this medicine? TRANEXAMIC ACID (TRAN ex AM ik AS id) slows down or stops blood clots from being broken down. This medicine is used to treat heavy monthly menstrual bleeding. This medicine may be used for other purposes; ask your health care provider or pharmacist if you have questions. COMMON BRAND NAME(S): Cyklokapron, Lysteda  What should I tell my health care provider before I take this medicine? They need to know if you have any of these conditions: -bleeding in the brain -blood clotting problems -kidney disease -vision problems -an unusual allergic reaction to tranexamic acid, other medicines, foods, dyes, or preservatives -pregnant or trying to get pregnant -breast-feeding How should I use this medicine? Take this medicine by mouth with a glass of water. Follow the directions on the prescription label. Do not cut, crush, or chew this medicine. You can take it with or without food. If it upsets your stomach, take it with food. Take your medicine at regular intervals. Do not take it more often than directed. Do not stop taking except on your doctor's advice. Do not take this medicine until your period has started. Do not take it for more than 5 days in a row. Do not take this medicine when you do not have your period. Talk to your pediatrician regarding the use of this medicine in children. While this drug may be prescribed for female children as young as 12 years of age for selected conditions, precautions do apply. Overdosage: If you think you've taken too much of this medicine contact a poison control center or emergency room at once. Overdosage: If you think you have taken too much of this medicine contact a poison control center or emergency room at once. NOTE: This medicine is only for you. Do not share this medicine with others. What if I miss a dose? If you miss a dose, take it when you remember, and then take your next dose at least 6 hours  later. Do not take more than 2 tablets at a time to make up for missed doses. What may interact with this medicine? Do not take this medicine with any of the following medications: -female hormones, like estrogens or progestins and birth control pills, patches, rings, or injections  This medicine may also interact with the following medications: -certain medicines used to help your blood clot or break up blood clots -certain medicines used to treat leukemia This list may not describe all possible interactions. Give your health care provider a list of all the medicines, herbs, non-prescription drugs, or dietary supplements you use. Also tell them if you smoke, drink alcohol, or use illegal drugs. Some items may interact with your medicine. What should I watch for while using this medicine? Tell your doctor or healthcare professional if your symptoms do not start to get better or if they get worse. Tell your doctor or healthcare professional if you notice any eye problems while taking this medicine. Your doctor will refer you to an eye doctor who will examine your eyes. What side effects may I notice from receiving this medicine? Side effects that you should report to your doctor or health care professional as soon as possible: -allergic reactions like skin rash, itching or hives, swelling of the face, lips, or tongue -breathing difficulties -changes in vision -sudden or severe pain in the chest, legs, head, or groin -unusually weak or tired  Side effects that usually do not require medical attention (Report these to your doctor or health   care professional if they continue or are bothersome.): -back pain -headache -muscle or joint aches -sinus and nasal problems -stomach pain -tiredness This list may not describe all possible side effects. Call your doctor for medical advice about side effects. You may report side effects to FDA at 1-800-FDA-1088. Where should I keep my medicine? Keep out of  the reach of children. Store at room temperature between 15 and 30 degrees C (59 and 86 degrees F). Throw away any unused medicine after the expiration date. NOTE: This sheet is a summary. It may not cover all possible information. If you have questions about this medicine, talk to your doctor, pharmacist, or health care provider.  2014, Elsevier/Gold Standard. (2012-07-14 17:45:19) Levonorgestrel intrauterine device (IUD) What is this medicine? LEVONORGESTREL IUD (LEE voe nor jes trel) is a contraceptive (birth control) device. The device is placed inside the uterus by a healthcare professional. It is used to prevent pregnancy and can also be used to treat heavy bleeding that occurs during your period. Depending on the device, it can be used for 3 to 5 years. This medicine may be used for other purposes; ask your health care provider or pharmacist if you have questions. COMMON BRAND NAME(S): Gretta Cool What should I tell my health care provider before I take this medicine? They need to know if you have any of these conditions: -abnormal Pap smear -cancer of the breast, uterus, or cervix -diabetes -endometritis -genital or pelvic infection now or in the past -have more than one sexual partner or your partner has more than one partner -heart disease -history of an ectopic or tubal pregnancy -immune system problems -IUD in place -liver disease or tumor -problems with blood clots or take blood-thinners -use intravenous drugs -uterus of unusual shape -vaginal bleeding that has not been explained -an unusual or allergic reaction to levonorgestrel, other hormones, silicone, or polyethylene, medicines, foods, dyes, or preservatives -pregnant or trying to get pregnant -breast-feeding How should I use this medicine? This device is placed inside the uterus by a health care professional. Talk to your pediatrician regarding the use of this medicine in children. Special care may be  needed. Overdosage: If you think you have taken too much of this medicine contact a poison control center or emergency room at once. NOTE: This medicine is only for you. Do not share this medicine with others. What if I miss a dose? This does not apply. What may interact with this medicine? Do not take this medicine with any of the following medications: -amprenavir -bosentan -fosamprenavir This medicine may also interact with the following medications: -aprepitant -barbiturate medicines for inducing sleep or treating seizures -bexarotene -griseofulvin -medicines to treat seizures like carbamazepine, ethotoin, felbamate, oxcarbazepine, phenytoin, topiramate -modafinil -pioglitazone -rifabutin -rifampin -rifapentine -some medicines to treat HIV infection like atazanavir, indinavir, lopinavir, nelfinavir, tipranavir, ritonavir -St. John's wort -warfarin This list may not describe all possible interactions. Give your health care provider a list of all the medicines, herbs, non-prescription drugs, or dietary supplements you use. Also tell them if you smoke, drink alcohol, or use illegal drugs. Some items may interact with your medicine. What should I watch for while using this medicine? Visit your doctor or health care professional for regular check ups. See your doctor if you or your partner has sexual contact with others, becomes HIV positive, or gets a sexual transmitted disease. This product does not protect you against HIV infection (AIDS) or other sexually transmitted diseases. You can check the placement of the IUD  yourself by reaching up to the top of your vagina with clean fingers to feel the threads. Do not pull on the threads. It is a good habit to check placement after each menstrual period. Call your doctor right away if you feel more of the IUD than just the threads or if you cannot feel the threads at all. The IUD may come out by itself. You may become pregnant if the device  comes out. If you notice that the IUD has come out use a backup birth control method like condoms and call your health care provider. Using tampons will not change the position of the IUD and are okay to use during your period. What side effects may I notice from receiving this medicine? Side effects that you should report to your doctor or health care professional as soon as possible: -allergic reactions like skin rash, itching or hives, swelling of the face, lips, or tongue -fever, flu-like symptoms -genital sores -high blood pressure -no menstrual period for 6 weeks during use -pain, swelling, warmth in the leg -pelvic pain or tenderness -severe or sudden headache -signs of pregnancy -stomach cramping -sudden shortness of breath -trouble with balance, talking, or walking -unusual vaginal bleeding, discharge -yellowing of the eyes or skin Side effects that usually do not require medical attention (report to your doctor or health care professional if they continue or are bothersome): -acne -breast pain -change in sex drive or performance -changes in weight -cramping, dizziness, or faintness while the device is being inserted -headache -irregular menstrual bleeding within first 3 to 6 months of use -nausea This list may not describe all possible side effects. Call your doctor for medical advice about side effects. You may report side effects to FDA at 1-800-FDA-1088. Where should I keep my medicine? This does not apply. NOTE: This sheet is a summary. It may not cover all possible information. If you have questions about this medicine, talk to your doctor, pharmacist, or health care provider.  2014, Elsevier/Gold Standard. (2011-08-30 13:54:04)

## 2013-07-28 LAB — FOLLICLE STIMULATING HORMONE: FSH: 6.8 m[IU]/mL

## 2013-07-29 LAB — VON WILLEBRAND PANEL
Coagulation Factor VIII: 185 % — ABNORMAL HIGH (ref 73–140)
Ristocetin Co-factor, Plasma: 161 % (ref 42–200)
Von Willebrand Antigen, Plasma: 180 % (ref 50–217)

## 2013-07-31 ENCOUNTER — Other Ambulatory Visit: Payer: Self-pay | Admitting: Gynecology

## 2013-07-31 DIAGNOSIS — R899 Unspecified abnormal finding in specimens from other organs, systems and tissues: Secondary | ICD-10-CM

## 2013-08-28 ENCOUNTER — Other Ambulatory Visit: Payer: BC Managed Care – PPO

## 2013-09-22 ENCOUNTER — Encounter: Payer: Self-pay | Admitting: Women's Health

## 2013-09-22 ENCOUNTER — Ambulatory Visit (INDEPENDENT_AMBULATORY_CARE_PROVIDER_SITE_OTHER): Payer: BC Managed Care – PPO | Admitting: Women's Health

## 2013-09-22 ENCOUNTER — Other Ambulatory Visit (HOSPITAL_COMMUNITY)
Admission: RE | Admit: 2013-09-22 | Discharge: 2013-09-22 | Disposition: A | Discharge status: Home / Self Care | Payer: BC Managed Care – PPO | Source: Ambulatory Visit | Attending: Gynecology | Admitting: Gynecology

## 2013-09-22 VITALS — BP 120/80 | Ht 65.5 in | Wt 157.0 lb

## 2013-09-22 DIAGNOSIS — N912 Amenorrhea, unspecified: Secondary | ICD-10-CM

## 2013-09-22 DIAGNOSIS — Z01419 Encounter for gynecological examination (general) (routine) without abnormal findings: Secondary | ICD-10-CM

## 2013-09-22 DIAGNOSIS — Z833 Family history of diabetes mellitus: Secondary | ICD-10-CM

## 2013-09-22 DIAGNOSIS — F411 Generalized anxiety disorder: Secondary | ICD-10-CM

## 2013-09-22 DIAGNOSIS — Z13220 Encounter for screening for lipoid disorders: Secondary | ICD-10-CM

## 2013-09-22 LAB — URINALYSIS W MICROSCOPIC + REFLEX CULTURE
Bilirubin Urine: NEGATIVE
Casts: NONE SEEN
Crystals: NONE SEEN
Glucose, UA: NEGATIVE mg/dL
Ketones, ur: NEGATIVE mg/dL
Leukocytes, UA: NEGATIVE
Nitrite: NEGATIVE
Protein, ur: NEGATIVE mg/dL
RBC / HPF: NONE SEEN RBC/hpf (ref <3)
Specific Gravity, Urine: 1.015 (ref 1.005–1.030)
Urobilinogen, UA: 0.2 mg/dL (ref 0.0–1.0)
WBC, UA: NONE SEEN WBC/hpf (ref <3)
pH: 5 (ref 5.0–8.0)

## 2013-09-22 LAB — CBC WITH DIFFERENTIAL/PLATELET
Basophils Absolute: 0 10*3/uL (ref 0.0–0.1)
Basophils Relative: 1 % (ref 0–1)
Eosinophils Absolute: 0.2 10*3/uL (ref 0.0–0.7)
Eosinophils Relative: 4 % (ref 0–5)
HCT: 40.5 % (ref 36.0–46.0)
Hemoglobin: 13.8 g/dL (ref 12.0–15.0)
Lymphocytes Relative: 27 % (ref 12–46)
Lymphs Abs: 1.7 10*3/uL (ref 0.7–4.0)
MCH: 31.5 pg (ref 26.0–34.0)
MCHC: 34.1 g/dL (ref 30.0–36.0)
MCV: 92.5 fL (ref 78.0–100.0)
Monocytes Absolute: 0.6 10*3/uL (ref 0.1–1.0)
Monocytes Relative: 9 % (ref 3–12)
Neutro Abs: 3.8 10*3/uL (ref 1.7–7.7)
Neutrophils Relative %: 59 % (ref 43–77)
Platelets: 279 10*3/uL (ref 150–400)
RBC: 4.38 MIL/uL (ref 3.87–5.11)
RDW: 12.9 % (ref 11.5–15.5)
WBC: 6.3 10*3/uL (ref 4.0–10.5)

## 2013-09-22 LAB — GLUCOSE, RANDOM: Glucose, Bld: 87 mg/dL (ref 70–99)

## 2013-09-22 LAB — LIPID PANEL
Cholesterol: 152 mg/dL (ref 0–200)
HDL: 51 mg/dL (ref >39)
LDL Cholesterol: 81 mg/dL (ref 0–99)
Total CHOL/HDL Ratio: 3 Ratio
Triglycerides: 99 mg/dL (ref <150)
VLDL: 20 mg/dL (ref 0–40)

## 2013-09-22 LAB — TSH: TSH: 0.482 u[IU]/mL (ref 0.350–4.500)

## 2013-09-22 MED ORDER — ALPRAZOLAM 0.5 MG PO TABS: 0.5000 mg | ORAL_TABLET | Freq: Every evening | ORAL | Status: DC | PRN

## 2013-09-22 NOTE — Progress Notes (Signed)
Alexandra Grant 26-Oct-1960 [id number]    History:    Presents for annual exam.  Monthly cycle until December/amenorrheic/vasectomy. Reports being more emotional, poor sleep, hot flushes. Normal Pap and mammogram history. Minimal smoker. Negative sonohysterogram 04/2013 for dysfunctional uterine bleeding. If not had a colonoscopy. Has had problems with anxiety in the past uses occasional Xanax.  Past medical history, past surgical history, family history and social history were all reviewed and documented in the EPIC chart. 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 Mother hypertension/heart disease/osteoporosis  ROS:  A  ROS was performed and pertinent positives and negatives are included.  Exam:  Filed Vitals:   09/22/13 1432  BP: 120/80    General appearance:  Normal Thyroid:  Symmetrical, normal in size, without palpable masses or nodularity. Respiratory  Auscultation:  Clear without wheezing or rhonchi Cardiovascular  Auscultation:  Regular rate, without rubs, murmurs or gallops  Edema/varicosities:  Not grossly evident Abdominal  Soft,nontender, without masses, guarding or rebound.  Liver/spleen:  No organomegaly noted  Hernia:  None appreciated  Skin  Inspection:  Grossly normal   Breasts: Examined lying and sitting.     Right: Without masses, retractions, discharge or axillary adenopathy.     Left: Without masses, retractions, discharge or axillary adenopathy. Gentitourinary   Inguinal/mons:  Normal without inguinal adenopathy  External genitalia:  Normal  BUS/Urethra/Skene's glands:  Normal  Vagina:  Normal  Cervix:  Normal  Uterus:   normal in size, shape and contour.  Midline and mobile  Adnexa/parametria:     Rt: Without masses or tenderness.   Lt: Without masses or tenderness.  Anus and perineum: Normal  Digital rectal exam: Normal sphincter tone without palpated masses or  tenderness  Assessment/Plan:  53 y.o. M. WF G2 P2  for annual exam with complaint of poor sleep, more emotional, hot flushes.  Perimenopausal/vasectomy Anxiety/insomnia Social smoker  Plan: Xanax 0.5 at bedtime as needed for rest, sleep hygiene reviewed, reviewed importance of regular exercise, counseling encouraged. SBE's, annual mammogram, instructed to schedule, overdue, history of dense breast 3D tomography reviewed and encouraged. CBC, FSH, lipid panel, glucose, UA, Pap. Pap normal 06/2011, new screening guidelines reviewed. Instructed to schedule screening colonoscopy. Aware of hazards of smoking. Menopause reviewed, will triage based on [facility] results, states does not want HRT at this time.   [place], 4:10 PM 09/22/2013

## 2013-09-22 NOTE — Patient Instructions (Signed)
Health Recommendations for Postmenopausal Women Respected and ongoing research has looked at the most common causes of death, disability, and poor quality of life in postmenopausal women. The causes include heart disease, diseases of blood vessels, diabetes, depression, cancer, and bone loss (osteoporosis). Many things can be done to help lower the chances of developing these and other common problems: CARDIOVASCULAR DISEASE Heart Disease: A heart attack is a medical emergency. Know the signs and symptoms of a heart attack. Below are things women can do to reduce their risk for heart disease.   Do not smoke. If you smoke, quit.  Aim for a healthy weight. Being overweight causes many preventable deaths. Eat a healthy and balanced diet and drink an adequate amount of liquids.  Get moving. Make a commitment to be more physically active. Aim for 30 minutes of activity on most, if not all days of the week.  Eat for heart health. Choose a diet that is low in saturated fat and cholesterol and eliminate trans fat. Include whole grains, vegetables, and fruits. Read and understand the labels on food containers before buying.  Know your numbers. Ask your caregiver to check your blood pressure, cholesterol (total, HDL, LDL, triglycerides) and blood glucose. Work with your caregiver on improving your entire clinical picture.  High blood pressure. Limit or stop your table salt intake (try salt substitute and food seasonings). Avoid salty foods and drinks. Read labels on food containers before buying. Eating well and exercising can help control high blood pressure. STROKE  Stroke is a medical emergency. Stroke may be the result of a blood clot in a blood vessel in the brain or by a brain hemorrhage (bleeding). Know the signs and symptoms of a stroke. To lower the risk of developing a stroke:  Avoid fatty foods.  Quit smoking.  Control your diabetes, blood pressure, and irregular heart rate. THROMBOPHLEBITIS  (BLOOD CLOT) OF THE LEG  Becoming overweight and leading a stationary lifestyle may also contribute to developing blood clots. Controlling your diet and exercising will help lower the risk of developing blood clots. CANCER SCREENING  Breast Cancer: Take steps to reduce your risk of breast cancer.  You should practice "breast self-awareness." This means understanding the normal appearance and feel of your breasts and should include breast self-examination. Any changes detected, no matter how small, should be reported to your caregiver.  After age 40, you should have a clinical breast exam (CBE) every year.  Starting at age 40, you should consider having a mammogram (breast X-ray) every year.  If you have a family history of breast cancer, talk to your caregiver about genetic screening.  If you are at high risk for breast cancer, talk to your caregiver about having an MRI and a mammogram every year.  Intestinal or Stomach Cancer: Tests to consider are a rectal exam, fecal occult blood, sigmoidoscopy, and colonoscopy. Women who are high risk may need to be screened at an earlier age and more often.  Cervical Cancer:  Beginning at age 30, you should have a Pap test every 3 years as long as the past 3 Pap tests have been normal.  If you have had past treatment for cervical cancer or a condition that could lead to cancer, you need Pap tests and screening for cancer for at least 20 years after your treatment.  If you had a hysterectomy for a problem that was not cancer or a condition that could lead to cancer, then you no longer need Pap tests.    If you are between ages 65 and 70, and you have had normal Pap tests going back 10 years, you no longer need Pap tests.  If Pap tests have been discontinued, risk factors (such as a new sexual partner) need to be reassessed to determine if screening should be resumed.  Some medical problems can increase the chance of getting cervical cancer. In these  cases, your caregiver may recommend more frequent screening and Pap tests.  Uterine Cancer: If you have vaginal bleeding after reaching menopause, you should notify your caregiver.  Ovarian cancer: Other than yearly pelvic exams, there are no reliable tests available to screen for ovarian cancer at this time except for yearly pelvic exams.  Lung Cancer: Yearly chest X-rays can detect lung cancer and should be done on high risk women, such as cigarette smokers and women with chronic lung disease (emphysema).  Skin Cancer: A complete body skin exam should be done at your yearly examination. Avoid overexposure to the sun and ultraviolet light lamps. Use a strong sun block cream when in the sun. All of these things are important in lowering the risk of skin cancer. MENOPAUSE Menopause Symptoms: Hormone therapy products are effective for treating symptoms associated with menopause:  Moderate to severe hot flashes.  Night sweats.  Mood swings.  Headaches.  Tiredness.  Loss of sex drive.  Insomnia.  Other symptoms. Hormone replacement carries certain risks, especially in older women. Women who use or are thinking about using estrogen or estrogen with progestin treatments should discuss that with their caregiver. Your caregiver will help you understand the benefits and risks. The ideal dose of hormone replacement therapy is not known. The Food and Drug Administration (FDA) has concluded that hormone therapy should be used only at the lowest doses and for the shortest amount of time to reach treatment goals.  OSTEOPOROSIS Protecting Against Bone Loss and Preventing Fracture: If you use hormone therapy for prevention of bone loss (osteoporosis), the risks for bone loss must outweigh the risk of the therapy. Ask your caregiver about other medications known to be safe and effective for preventing bone loss and fractures. To guard against bone loss or fractures, the following is recommended:  If  you are less than age 50, take 1000 mg of calcium and at least 600 mg of Vitamin D per day.  If you are greater than age 50 but less than age 70, take 1200 mg of calcium and at least 600 mg of Vitamin D per day.  If you are greater than age 70, take 1200 mg of calcium and at least 800 mg of Vitamin D per day. Smoking and excessive alcohol intake increases the risk of osteoporosis. Eat foods rich in calcium and vitamin D and do weight bearing exercises several times a week as your caregiver suggests. DIABETES Diabetes Melitus: If you have Type I or Type 2 diabetes, you should keep your blood sugar under control with diet, exercise and recommended medication. Avoid too many sweets, starchy and fatty foods. Being overweight can make control more difficult. COGNITION AND MEMORY Cognition and Memory: Menopausal hormone therapy is not recommended for the prevention of cognitive disorders such as Alzheimer's disease or memory loss.  DEPRESSION  Depression may occur at any age, but is common in elderly women. The reasons may be because of physical, medical, social (loneliness), or financial problems and needs. If you are experiencing depression because of medical problems and control of symptoms, talk to your caregiver about this. Physical activity and   exercise may help with mood and sleep. Community and volunteer involvement may help your sense of value and worth. If you have depression and you feel that the problem is getting worse or becoming severe, talk to your caregiver about treatment options that are best for you. ACCIDENTS  Accidents are common and can be serious in the elderly woman. Prepare your house to prevent accidents. Eliminate throw rugs, place hand bars in the bath, shower and toilet areas. Avoid wearing high heeled shoes or walking on wet, snowy, and icy areas. Limit or stop driving if you have vision or hearing problems, or you feel you are unsteady with you movements and  reflexes. HEPATITIS C Hepatitis C is a type of viral infection affecting the liver. It is spread mainly through contact with blood from an infected person. It can be treated, but if left untreated, it can lead to severe liver damage over years. Many people who are infected do not know that the virus is in their blood. If you are a "baby-boomer", it is recommended that you have one screening test for Hepatitis C. IMMUNIZATIONS  Several immunizations are important to consider having during your senior years, including:   Tetanus, diptheria, and pertussis booster shot.  Influenza every year before the flu season begins.  Pneumonia vaccine.  Shingles vaccine.  Others as indicated based on your specific needs. Talk to your caregiver about these. Document Released: 09/21/2005 Document Revised: 07/16/2012 Document Reviewed: 05/17/2008 [employer] Patient Information 2014 [employer].  

## 2013-09-23 LAB — FOLLICLE STIMULATING HORMONE: FSH: 48.9 m[IU]/mL

## 2013-10-27 ENCOUNTER — Other Ambulatory Visit: Payer: Self-pay

## 2013-10-27 DIAGNOSIS — F411 Generalized anxiety disorder: Secondary | ICD-10-CM

## 2013-10-27 MED ORDER — ALPRAZOLAM 0.5 MG PO TABS
0.5000 mg | ORAL_TABLET | Freq: Every evening | ORAL | Status: DC | PRN
Start: 2013-10-27 — End: 2013-12-14

## 2013-10-28 NOTE — Telephone Encounter (Signed)
Called into pharmacy

## 2013-11-17 ENCOUNTER — Telehealth: Payer: Self-pay

## 2013-11-17 MED ORDER — ESZOPICLONE 2 MG PO TABS: 2.0000 mg | ORAL_TABLET | Freq: Every evening | ORAL | Status: DC | PRN

## 2013-11-17 MED ORDER — ZOLPIDEM TARTRATE 10 MG PO TABS: 10.0000 mg | ORAL_TABLET | Freq: Every evening | ORAL | Status: DC | PRN

## 2013-11-17 NOTE — Telephone Encounter (Signed)
Patient states when she was in in Feb that she discussed her problems with sleeping and you prescribed some Xanax for her and told her to let you know how she did with it.  She said it is not helping her.  She doesn't want to just keep increasing it to more and more. She said much stress in her life currently.  She said that some nights she is up all night and the nights she does sleep it is just a couple of hours. Becoming a real problem for her.    Do you want her to come back in and discuss with you?  (tells me she may have to take her grandmother to ER this morning so if we get her voice mail just leave a message and she'll call back.)

## 2013-11-17 NOTE — Telephone Encounter (Signed)
Please call, we could try Ambien 10 mg at bedtime, #30 with 1 refill. Review importance of trying to go to bed at the same time and getting up at the same time, similar routine in the evenings, avoid anything caffeinated after noon.

## 2013-11-17 NOTE — Telephone Encounter (Signed)
rx called in, pt informed. 

## 2013-11-17 NOTE — Telephone Encounter (Signed)
Pt called back requesting to take lunesta rather than Ambien. Pt spoke with a co-worker about this medication and she experienced some sleep walking. Pt is now "scared" to take medication. Please advise

## 2013-11-17 NOTE — Telephone Encounter (Signed)
Left detailed message in voice mail at patient's request. Advised of all that Alexandra Grant wrote below.  Rx called in to pharmacy.

## 2013-11-17 NOTE — Telephone Encounter (Signed)
Ok, Lunesta 2 mg at bedtime as needed, #15.  Hope that helps

## 2013-11-17 NOTE — Addendum Note (Signed)
Addended by: Thamas Jaegers on: 11/17/2013 03:49 PM   Modules accepted: Orders, Medications

## 2013-12-14 ENCOUNTER — Other Ambulatory Visit: Payer: Self-pay

## 2013-12-14 DIAGNOSIS — F411 Generalized anxiety disorder: Secondary | ICD-10-CM

## 2013-12-14 MED ORDER — ALPRAZOLAM 0.5 MG PO TABS: 0.5000 mg | ORAL_TABLET | Freq: Every evening | ORAL | Status: DC | PRN

## 2013-12-14 NOTE — Telephone Encounter (Signed)
Called into pharmacy

## 2014-01-15 ENCOUNTER — Other Ambulatory Visit: Payer: Self-pay

## 2014-01-15 DIAGNOSIS — F411 Generalized anxiety disorder: Secondary | ICD-10-CM

## 2014-01-15 MED ORDER — ALPRAZOLAM 0.5 MG PO TABS: 0.5000 mg | ORAL_TABLET | Freq: Every evening | ORAL | Status: DC | PRN

## 2014-01-15 NOTE — Telephone Encounter (Signed)
Called into pharmacy

## 2014-03-01 ENCOUNTER — Telehealth: Payer: Self-pay

## 2014-03-01 NOTE — Telephone Encounter (Signed)
Called and advised pt to come to the clinic during Dr. Pauletta Browns clinic hours on Wednesday to see him. She states she will be coming in.

## 2014-03-01 NOTE — Telephone Encounter (Signed)
PT STATES HER WHOLE FAMILY SEES DR Joseph Art AND THEY ARE FAMILY FRIENDS. THIS PATIENT WANTS TO KNOW IF DR LAUENSTEIN WILL ACCEPT THIS PERSON AS A NEW PATIENT . PLEASE ADVISE

## 2014-03-03 ENCOUNTER — Ambulatory Visit (INDEPENDENT_AMBULATORY_CARE_PROVIDER_SITE_OTHER): Payer: BC Managed Care – PPO | Admitting: Family Medicine

## 2014-03-03 ENCOUNTER — Ambulatory Visit (INDEPENDENT_AMBULATORY_CARE_PROVIDER_SITE_OTHER): Payer: BC Managed Care – PPO

## 2014-03-03 VITALS — BP 122/78 | HR 60 | Temp 98.6°F | Ht 65.75 in | Wt 154.0 lb

## 2014-03-03 DIAGNOSIS — R0989 Other specified symptoms and signs involving the circulatory and respiratory systems: Secondary | ICD-10-CM

## 2014-03-03 DIAGNOSIS — R0609 Other forms of dyspnea: Secondary | ICD-10-CM

## 2014-03-03 DIAGNOSIS — R0600 Dyspnea, unspecified: Secondary | ICD-10-CM

## 2014-03-03 DIAGNOSIS — I498 Other specified cardiac arrhythmias: Secondary | ICD-10-CM

## 2014-03-03 DIAGNOSIS — F439 Reaction to severe stress, unspecified: Secondary | ICD-10-CM

## 2014-03-03 DIAGNOSIS — Z733 Stress, not elsewhere classified: Secondary | ICD-10-CM

## 2014-03-03 NOTE — Progress Notes (Addendum)
This chart was scribed for Alexandra Haber, MD by Einar Pheasant, ED Scribe. This patient was seen in room 11 and the patient's care was started at 6:34 PM.  Patient ID: Alexandra Grant MRN: [id number], DOB: 10-12-1960, 53 y.o. Date of Encounter: 03/03/2014, 6:33 PM  Primary Physician: Alexandra Haber, MD  Chief Complaint:  Chief Complaint  Patient presents with   Establish Care   Shortness of Breath     HPI: 53 y.o. year old female with history below presents to [facility] complaining of worsening SOB that started about 6 months ago. Pt states that she has noticed that when she eats late, which is often, she experiences moderate SOB. She states that she has been anxious of having lung cancer due to her family history of it. Pt's aunt recently died from lung cancer. However, she knows that she is getting worked up over nothing. She denies any SOB during the day or after exertion. 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 [name] reports taking Xanax at night.  Denies any chest pain, congestion, fever, chills, diaphoresis, or cough.   Past Medical History  Diagnosis Date   Menorrhagia 2014     Home Meds: Prior to Admission medications   Medication Sig Start Date End Date Taking? Authorizing Provider  ALPRAZolam Duanne Moron) 0.5 MG tablet Take 1 tablet (0.5 mg total) by mouth at bedtime as needed for anxiety. 01/15/14  Yes [name], NP  Ibuprofen (ADVIL PO) Take by mouth as needed.     Yes Historical Provider, MD    Allergies: No Known Allergies  History   Social History   Marital Status: Married    Spouse Name: N/A    Number of Children: N/A   Years of Education: N/A   Occupational History   Not on file.   Social History Main Topics   Smoking status: Current Every Day Smoker -- 20 years    Types: Cigarettes   Smokeless tobacco: Never Used   Alcohol Use: 7.0 oz/week    14 drink(s) per week     Drug Use: No   Sexual Activity: Yes    Partners: Male    Birth Control/ Protection: Other-see comments     Comment: vasectomy   Other Topics Concern   Not on file   Social History Narrative   No narrative on file     Review of Systems: positive for SOB Constitutional: negative for chills, fever, night sweats, weight changes, or fatigue  HEENT: negative for vision changes, hearing loss, congestion, rhinorrhea, ST, epistaxis, or sinus pressure Cardiovascular: negative for chest pain or palpitations Respiratory: negative for hemoptysis, wheezing, shortness of breath, or cough Abdominal: negative for abdominal pain, nausea, vomiting, diarrhea, or constipation Dermatological: negative for rash Neurologic: negative for headache, dizziness, or syncope All other systems reviewed and are otherwise negative with the exception to those above and in the HPI.   Physical Exam: Blood pressure 122/78, pulse 60, temperature 98.6 F (37 C), temperature source Oral, height 5' 5.75" (1.67 m), weight 154 lb (69.854 kg), SpO2 99.00%., Body mass index is 25.05 kg/(m^2). General: Well developed, well nourished, in no acute distress. Head: Normocephalic, atraumatic, eyes without discharge, sclera non-icteric, nares are without discharge. Bilateral auditory canals clear, TM's are without perforation, pearly grey and translucent with reflective cone of light bilaterally. Oral cavity moist, posterior pharynx without exudate, erythema, peritonsillar abscess, or post nasal drip.  Neck: Supple. No  thyromegaly. Full ROM. No lymphadenopathy. Lungs: Clear bilaterally to auscultation without wheezes, rales, or rhonchi. Breathing is unlabored. Heart: RRR with S1 S2. No murmurs, rubs, or gallops appreciated. Abdomen: Soft, non-tender, non-distended with normoactive bowel sounds. No hepatomegaly. No rebound/guarding. No obvious abdominal masses. Msk:  Strength and tone normal for age. Extremities/Skin: Warm and  dry. No clubbing or cyanosis. No edema. No rashes or suspicious lesions. Neuro: Alert and oriented X 3. Moves all extremities spontaneously. Gait is normal. CNII-XII grossly in tact. Psych:  Responds to questions appropriately with a normal affect.   EKG:  Junctional rhythm [facility] reading (PRIMARY) by  Dr. Joseph Art   CXR without abnormality.   ASSESSMENT AND PLAN:  53 y.o. year old female with dyspnea, ongoing for over a year. She's been under increased stress as last year which may be adding to her awareness of this. The fact that it occurs only when she eats late at night suggests or may be a reflux component to this.  Try Zantac for several days.  If not improving, we may try buspar or something mild  I personally performed the services described in this documentation, which was scribed in my presence. The recorded information has been reviewed and is accurate.  Signed, Alexandra Haber, MD 03/03/2014 6:33 PM

## 2014-03-26 ENCOUNTER — Telehealth: Payer: Self-pay

## 2014-03-26 NOTE — Telephone Encounter (Signed)
What would you recommend for dyspnea at night?

## 2014-03-26 NOTE — Telephone Encounter (Signed)
Pt called in, sees Dr Joseph Art and states she takes otc Primative mist and they no longer make it and she needs something similar for difficulty breathing @ night. She can be reached [phone]. Thank you

## 2014-03-27 MED ORDER — ALBUTEROL SULFATE HFA 108 (90 BASE) MCG/ACT IN AERS: 2.0000 | INHALATION_SPRAY | Freq: Four times a day (QID) | RESPIRATORY_TRACT | Status: DC | PRN

## 2014-03-27 NOTE — Telephone Encounter (Signed)
Best approach is Ventolin inhaler.  It lasts longer and is universally available.  Make sure pillows have no feathers or allergic materials.  I will call in the Ventolin

## 2014-03-28 NOTE — Telephone Encounter (Signed)
Left message to call back  

## 2014-03-28 NOTE — Telephone Encounter (Signed)
Advised pt that rx was sent in and to check pillows

## 2014-04-05 ENCOUNTER — Other Ambulatory Visit: Payer: Self-pay

## 2014-04-05 DIAGNOSIS — F411 Generalized anxiety disorder: Secondary | ICD-10-CM

## 2014-04-05 MED ORDER — ALPRAZOLAM 0.5 MG PO TABS: 0.5000 mg | ORAL_TABLET | Freq: Every evening | ORAL | Status: DC | PRN

## 2014-04-05 NOTE — Telephone Encounter (Signed)
Called into pharmacy

## 2014-04-23 ENCOUNTER — Encounter: Payer: Self-pay | Admitting: Family Medicine

## 2014-04-23 ENCOUNTER — Ambulatory Visit (INDEPENDENT_AMBULATORY_CARE_PROVIDER_SITE_OTHER): Payer: BC Managed Care – PPO | Admitting: Family Medicine

## 2014-04-23 VITALS — BP 132/88 | HR 73 | Temp 98.1°F | Resp 16 | Ht 66.0 in | Wt 152.0 lb

## 2014-04-23 DIAGNOSIS — G4700 Insomnia, unspecified: Secondary | ICD-10-CM

## 2014-04-23 DIAGNOSIS — F411 Generalized anxiety disorder: Secondary | ICD-10-CM

## 2014-04-23 DIAGNOSIS — Z23 Encounter for immunization: Secondary | ICD-10-CM

## 2014-04-23 DIAGNOSIS — K219 Gastro-esophageal reflux disease without esophagitis: Secondary | ICD-10-CM

## 2014-04-23 DIAGNOSIS — J387 Other diseases of larynx: Secondary | ICD-10-CM

## 2014-04-23 MED ORDER — ALPRAZOLAM 0.5 MG PO TABS: 0.5000 mg | ORAL_TABLET | Freq: Every evening | ORAL | Status: DC | PRN

## 2014-04-23 MED ORDER — RANITIDINE HCL 150 MG PO TABS: 150.0000 mg | ORAL_TABLET | Freq: Two times a day (BID) | ORAL | Status: DC

## 2014-04-23 NOTE — Progress Notes (Signed)
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  Associated with hoarseness.  Taking ranitidine which works better than omeprazole   Also taking two alpraxolam for sleep  Patient also has junctional rhythm.  Objective:  NAD Chest:  Clear Heart:  Regular without murmur or gallop  Assessment:  Laryngopharyngeal reflux.  Insomnia with anxiety   Plan:   Need for prophylactic vaccination and inoculation against influenza - Plan: Flu Vaccine QUAD 36+ mos IM  Laryngopharyngeal reflux - Plan: ranitidine (ZANTAC) 150 MG tablet, Ambulatory referral to Gastroenterology  Anxiety state, unspecified - Plan: ALPRAZolam (XANAX) 0.5 MG tablet  Insomnia - Plan: ALPRAZolam Duanne Moron) 0.5 MG tablet  Signed, [name], MD

## 2014-05-17 ENCOUNTER — Encounter: Payer: Self-pay | Admitting: *Deleted

## 2014-05-18 ENCOUNTER — Encounter: Payer: Self-pay | Admitting: *Deleted

## 2014-05-18 ENCOUNTER — Ambulatory Visit (INDEPENDENT_AMBULATORY_CARE_PROVIDER_SITE_OTHER): Payer: BC Managed Care – PPO | Admitting: Cardiology

## 2014-05-18 ENCOUNTER — Encounter: Payer: Self-pay | Admitting: Cardiology

## 2014-05-18 VITALS — BP 122/78 | HR 62 | Ht 66.0 in | Wt 153.0 lb

## 2014-05-18 DIAGNOSIS — I498 Other specified cardiac arrhythmias: Secondary | ICD-10-CM

## 2014-05-18 DIAGNOSIS — R0600 Dyspnea, unspecified: Secondary | ICD-10-CM | POA: Insufficient documentation

## 2014-05-18 DIAGNOSIS — R079 Chest pain, unspecified: Secondary | ICD-10-CM

## 2014-05-18 DIAGNOSIS — I491 Atrial premature depolarization: Secondary | ICD-10-CM | POA: Insufficient documentation

## 2014-05-18 LAB — BRAIN NATRIURETIC PEPTIDE: Pro B Natriuretic peptide (BNP): 52 pg/mL (ref 0.0–100.0)

## 2014-05-18 NOTE — Patient Instructions (Addendum)
Your physician has requested that you have an echocardiogram. Echocardiography is a painless test that uses sound waves to create images of your heart. It provides your doctor with information about the size and shape of your heart and how well your heart's chambers and valves are working. This procedure takes approximately one hour. There are no restrictions for this procedure.  Your physician has requested that you have an exercise tolerance test. For further information please visit [email or web address]. Please also follow instruction sheet, as given.  Your physician recommends that you have  lab work today--BNP  Your physician recommends that you schedule a follow-up appointment as needed with Dr Aundra Dubin.

## 2014-05-18 NOTE — Progress Notes (Signed)
Patient ID: Alexandra Grant, female   DOB: Sep 18, 1960, 53 y.o.   MRN: [id number] PCP: Dr. Joseph Art  53 yo presents for evaluation of dyspnea.  For about a year, she had had episodes of shortness of breath when she would lie down in bed at night. She says that she would wheeze.  This was more likely to happen if she ate dinner soon before going to bed.  No chest pain.  No palpitations. She walks for exercise and rides an exercise bike. Dyspnea only with heavy exertion but not with normal everyday activities.  There was concern that dyspnea when she laid down in bed at night could be due to GERD.  She started Nexium about 2 wks ago and actually feels considerably better.  She is not getting the nocturnal wheezing and dyspnea anymore.    I also noted that patient's ECG from 7/15 showed an ectopic atrial rhythm.   Today's ECG shows NSR.   ECG (7/15): ectopic atrial rhythm, otherwise normal ECG (today): NSR, normal   Labs (2/15): LDL 81, HDL 51, TSH normal  PMH: 1. Menorrhagia 2. 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  3. GERD  SH: Works at [employer], married, used to smoke but quit years ago.   FH: Mother with CAD diagnosed around 55, grandparents with MIs  ROS: All systems reviewed and negative except as per HPI.   Current Outpatient Prescriptions  Medication Sig Dispense Refill  . ALPRAZolam (XANAX) 0.5 MG tablet Take 1 tablet (0.5 mg total) by mouth at bedtime and may repeat dose one time if needed.  60 tablet  5  . esomeprazole (NEXIUM) 40 MG capsule Take 40 mg by mouth daily as needed.      . Ibuprofen (ADVIL PO) Take by mouth as needed.         No current facility-administered medications for this visit.    BP 122/78  Pulse 62  Ht 5\' 6"  (1.676 m)  Wt 153 lb (69.4 kg)  BMI 24.71 kg/m2 General: NAD Neck: No JVD, no thyromegaly or thyroid nodule.  Lungs: Clear to auscultation bilaterally with normal respiratory effort. CV: Nondisplaced PMI.  Heart regular S1/S2, no S3/S4, no murmur.  No  peripheral edema.  No carotid bruit.  Normal pedal pulses.  Abdomen: Soft, nontender, no hepatosplenomegaly, no distention.  Skin: Intact without lesions or rashes.  Neurologic: Alert and oriented x 3.  Psych: Normal affect. Extremities: No clubbing or cyanosis.  HEENT: Normal.   Assessment/Plan:  1. Dyspnea: Patient was having dyspnea when lying down in bed at night, associated with wheezing.  It has resolved with Nexium. I agree that this is probably GERD-related.  Continue Nexium.   - She is not volume overloaded on exam, but I will get a BNP to make sure that there is no suggestion of CHF.  - Echocardiogram.  2. Ectopic atrial rhythm: NSR today but she had an ectopic rhythm on last ECG.  Not particularly concerning but may portend sinus node dysfunction down the road.  - Echo will show any structural abnormalities.  - I will have her do an ETT to assess rhythm response to exercise.   Loralie Champagne 05/18/2014 3:48 PM

## 2014-05-28 ENCOUNTER — Ambulatory Visit (HOSPITAL_COMMUNITY): Payer: BC Managed Care – PPO

## 2014-06-14 ENCOUNTER — Encounter: Payer: Self-pay | Admitting: Cardiology

## 2014-06-15 ENCOUNTER — Ambulatory Visit (INDEPENDENT_AMBULATORY_CARE_PROVIDER_SITE_OTHER): Payer: BC Managed Care – PPO | Admitting: Physician Assistant

## 2014-06-15 DIAGNOSIS — R079 Chest pain, unspecified: Secondary | ICD-10-CM

## 2014-06-15 DIAGNOSIS — I498 Other specified cardiac arrhythmias: Secondary | ICD-10-CM

## 2014-06-15 NOTE — Progress Notes (Signed)
Normal study

## 2014-06-15 NOTE — Progress Notes (Signed)
Exercise Treadmill Test  Pre-Exercise Testing Evaluation Rhythm: normal sinus  Rate: 64 bpm     Test  Exercise Tolerance Test Ordering MD: Loralie Champagne, MD  Interpreting MD: Richardson Dopp, PA-C  Unique Test No: 1  Treadmill:  1  Indication for ETT: chest pain - rule out ischemia  Contraindication to ETT: No   Stress Modality: exercise - treadmill  Cardiac Imaging Performed: non   Protocol: standard Bruce - maximal  Max BP:  197/95  Max MPHR (bpm):  168 85% MPR (bpm):  143  MPHR obtained (bpm):  155 % MPHR obtained:  92  Reached 85% MPHR (min:sec):  7:04 Total Exercise Time (min-sec):  9:00  Workload in METS:  10.1 Borg Scale: 17  Reason ETT Terminated:  desired heart rate attained    ST Segment Analysis At Rest: normal ST segments - no evidence of significant ST depression With Exercise: non-specific ST changes  Other Information Arrhythmia:  No Angina during ETT:  absent (0) Quality of ETT:  diagnostic  ETT Interpretation:  normal - no evidence of ischemia by ST analysis  Comments: Good exercise capacity. No chest pain. Normal BP response to exercise. No ST changes to suggest ischemia.  No exercise induced arrhythmias.  Recommendations: FU with Dr. Loralie Champagne as directed. Signed,  Richardson Dopp, PA-C   06/15/2014 9:46 AM

## 2014-06-24 ENCOUNTER — Telehealth: Payer: Self-pay

## 2014-06-24 DIAGNOSIS — G4700 Insomnia, unspecified: Secondary | ICD-10-CM

## 2014-06-24 DIAGNOSIS — F4323 Adjustment disorder with mixed anxiety and depressed mood: Secondary | ICD-10-CM

## 2014-06-24 NOTE — Telephone Encounter (Signed)
Patient called to request a refill on xanax. Please return call and advise. [facility] on [address] in [place]. Patient's CB # [id number]

## 2014-06-25 MED ORDER — ALPRAZOLAM 0.5 MG PO TABS: 0.5000 mg | ORAL_TABLET | Freq: Every evening | ORAL | Status: DC | PRN

## 2014-07-28 ENCOUNTER — Encounter: Payer: Self-pay | Admitting: Family Medicine

## 2014-09-23 ENCOUNTER — Encounter: Payer: Self-pay | Admitting: Women's Health

## 2014-09-23 ENCOUNTER — Ambulatory Visit (INDEPENDENT_AMBULATORY_CARE_PROVIDER_SITE_OTHER): Payer: BC Managed Care – PPO | Admitting: Women's Health

## 2014-09-23 VITALS — BP 126/80 | Ht 66.0 in | Wt 155.0 lb

## 2014-09-23 DIAGNOSIS — F4323 Adjustment disorder with mixed anxiety and depressed mood: Secondary | ICD-10-CM

## 2014-09-23 DIAGNOSIS — G4700 Insomnia, unspecified: Secondary | ICD-10-CM

## 2014-09-23 DIAGNOSIS — Z01419 Encounter for gynecological examination (general) (routine) without abnormal findings: Secondary | ICD-10-CM

## 2014-09-23 DIAGNOSIS — Z13220 Encounter for screening for lipoid disorders: Secondary | ICD-10-CM

## 2014-09-23 MED ORDER — ALPRAZOLAM 1 MG PO TABS: 1.0000 mg | ORAL_TABLET | Freq: Every evening | ORAL | Status: DC | PRN

## 2014-09-23 NOTE — Progress Notes (Signed)
Alexandra Grant [birth year] [id number]    History:    Presents for annual exam.  Cycles every 2-3 months for 5-7 days, cycles getting lighter. Vasectomy. Negative colonoscopy 07/2014. Normal Pap and mammogram history, mammogram overdue. Biggest problem is insomnia has much difficulty sleeping uses Xanax 1 mg.  Past medical history, past surgical history, family history and social history were all reviewed and documented in the EPIC chart. 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 Mother heart disease, and osteoporosis, grandmother in her 2s.  ROS:  A ROS was performed and pertinent positives and negatives are included.  Exam:  Filed Vitals:   09/23/14 1510  BP: 126/80    General appearance:  Normal Thyroid:  Symmetrical, normal in size, without palpable masses or nodularity. Respiratory  Auscultation:  Clear without wheezing or rhonchi Cardiovascular  Auscultation:  Regular rate, without rubs, murmurs or gallops  Edema/varicosities:  Not grossly evident Abdominal  Soft,nontender, without masses, guarding or rebound.  Liver/spleen:  No organomegaly noted  Hernia:  None appreciated  Skin  Inspection:  Grossly normal   Breasts: Examined lying and sitting.     Right: Without masses, retractions, discharge or axillary adenopathy.     Left: Without masses, retractions, discharge or axillary adenopathy. Gentitourinary   Inguinal/mons:  Normal without inguinal adenopathy  External genitalia:  Normal  BUS/Urethra/Skene's glands:  Normal  Vagina:  Normal  Cervix:  Normal  Uterus:  normal in size, shape and contour.  Midline and mobile  Adnexa/parametria:     Rt: Without masses or tenderness.   Lt: Without masses or tenderness.  Anus and perineum: Normal  Digital rectal exam: Normal sphincter tone without palpated masses or tenderness  Assessment/Plan:  54 y.o. MWF G2P2 for annual exam.     Insomnia Perimenopausal with irregular cycles/vasectomy  Plan: Reviewed importance of annual screen, instructed to schedule annual screening mammogram. SBE's, regular exercise, calcium rich diet, vitamin D 1000 daily encouraged. Return to office if cycles space greater than 3 months. Long discussion on insomnia/sleep hygiene, Xanax 1 mg by mouth at bedtime when necessary reviewed to use sparingly, addictive properties reviewed. CBC, CMP, lipid panel, TSH, UA, Pap normal 2015, new screening guidelines reviewed.    [place], 5:18 PM 09/23/2014

## 2014-09-23 NOTE — Patient Instructions (Signed)
Health Maintenance Adopting a healthy lifestyle and getting preventive care can go a long way to promote health and wellness. Talk with your health care provider about what schedule of regular examinations is right for you. This is a good chance for you to check in with your provider about disease prevention and staying healthy. In between checkups, there are plenty of things you can do on your own. Experts have done a lot of research about which lifestyle changes and preventive measures are most likely to keep you healthy. Ask your health care provider for more information. WEIGHT AND DIET  Eat a healthy diet  Be sure to include plenty of vegetables, fruits, low-fat dairy products, and lean protein.  Do not eat a lot of foods high in solid fats, added sugars, or salt.  Get regular exercise. This is one of the most important things you can do for your health.  Most adults should exercise for at least 150 minutes each week. The exercise should increase your heart rate and make you sweat (moderate-intensity exercise).  Most adults should also do strengthening exercises at least twice a week. This is in addition to the moderate-intensity exercise.  Maintain a healthy weight  Body mass index (BMI) is a measurement that can be used to identify possible weight problems. It estimates body fat based on height and weight. Your health care provider can help determine your BMI and help you achieve or maintain a healthy weight.  For females 61 years of age and older:   A BMI below 18.5 is considered underweight.  A BMI of 18.5 to 24.9 is normal.  A BMI of 25 to 29.9 is considered overweight.  A BMI of 30 and above is considered obese.  Watch levels of cholesterol and blood lipids  You should start having your blood tested for lipids and cholesterol at 53 years of age, then have this test every 5 years.  You may need to have your cholesterol levels checked more often if:  Your lipid or  cholesterol levels are high.  You are older than 53 years of age.  You are at high risk for heart disease.  CANCER SCREENING   Lung Cancer  Lung cancer screening is recommended for adults 77-19 years old who are at high risk for lung cancer because of a history of smoking.  A yearly low-dose CT scan of the lungs is recommended for people who:  Currently smoke.  Have quit within the past 15 years.  Have at least a 30-pack-year history of smoking. A pack year is smoking an average of one pack of cigarettes a day for 1 year.  Yearly screening should continue until it has been 15 years since you quit.  Yearly screening should stop if you develop a health problem that would prevent you from having lung cancer treatment.  Breast Cancer  Practice breast self-awareness. This means understanding how your breasts normally appear and feel.  It also means doing regular breast self-exams. Let your health care provider know about any changes, no matter how small.  If you are in your 20s or 30s, you should have a clinical breast exam (CBE) by a health care provider every 1-3 years as part of a regular health exam.  If you are 15 or older, have a CBE every year. Also consider having a breast X-ray (mammogram) every year.  If you have a family history of breast cancer, talk to your health care provider about genetic screening.  If you are  at high risk for breast cancer, talk to your health care provider about having an MRI and a mammogram every year.  Breast cancer gene (BRCA) assessment is recommended for women who have family members with BRCA-related cancers. BRCA-related cancers include:  Breast.  Ovarian.  Tubal.  Peritoneal cancers.  Results of the assessment will determine the need for genetic counseling and BRCA1 and BRCA2 testing. Cervical Cancer Routine pelvic examinations to screen for cervical cancer are no longer recommended for nonpregnant women who are considered low  risk for cancer of the pelvic organs (ovaries, uterus, and vagina) and who do not have symptoms. A pelvic examination may be necessary if you have symptoms including those associated with pelvic infections. Ask your health care provider if a screening pelvic exam is right for you.   The Pap test is the screening test for cervical cancer for women who are considered at risk.  If you had a hysterectomy for a problem that was not cancer or a condition that could lead to cancer, then you no longer need Pap tests.  If you are older than 65 years, and you have had normal Pap tests for the past 10 years, you no longer need to have Pap tests.  If you have had past treatment for cervical cancer or a condition that could lead to cancer, you need Pap tests and screening for cancer for at least 20 years after your treatment.  If you no longer get a Pap test, assess your risk factors if they change (such as having a new sexual partner). This can affect whether you should start being screened again.  Some women have medical problems that increase their chance of getting cervical cancer. If this is the case for you, your health care provider may recommend more frequent screening and Pap tests.  The human papillomavirus (HPV) test is another test that may be used for cervical cancer screening. The HPV test looks for the virus that can cause cell changes in the cervix. The cells collected during the Pap test can be tested for HPV.  The HPV test can be used to screen women 30 years of age and older. Getting tested for HPV can extend the interval between normal Pap tests from three to five years.  An HPV test also should be used to screen women of any age who have unclear Pap test results.  After 53 years of age, women should have HPV testing as often as Pap tests.  Colorectal Cancer  This type of cancer can be detected and often prevented.  Routine colorectal cancer screening usually begins at 53 years of  age and continues through 53 years of age.  Your health care provider may recommend screening at an earlier age if you have risk factors for colon cancer.  Your health care provider may also recommend using home test kits to check for hidden blood in the stool.  A small camera at the end of a tube can be used to examine your colon directly (sigmoidoscopy or colonoscopy). This is done to check for the earliest forms of colorectal cancer.  Routine screening usually begins at age 50.  Direct examination of the colon should be repeated every 5-10 years through 53 years of age. However, you may need to be screened more often if early forms of precancerous polyps or small growths are found. Skin Cancer  Check your skin from head to toe regularly.  Tell your health care provider about any new moles or changes in   moles, especially if there is a change in a mole's shape or color.  Also tell your health care provider if you have a mole that is larger than the size of a pencil eraser.  Always use sunscreen. Apply sunscreen liberally and repeatedly throughout the day.  Protect yourself by wearing long sleeves, pants, a wide-brimmed hat, and sunglasses whenever you are outside. HEART DISEASE, DIABETES, AND HIGH BLOOD PRESSURE   Have your blood pressure checked at least every 1-2 years. High blood pressure causes heart disease and increases the risk of stroke.  If you are between 75 years and 42 years old, ask your health care provider if you should take aspirin to prevent strokes.  Have regular diabetes screenings. This involves taking a blood sample to check your fasting blood sugar level.  If you are at a normal weight and have a low risk for diabetes, have this test once every three years after 53 years of age.  If you are overweight and have a high risk for diabetes, consider being tested at a younger age or more often. PREVENTING INFECTION  Hepatitis B  If you have a higher risk for  hepatitis B, you should be screened for this virus. You are considered at high risk for hepatitis B if:  You were born in a country where hepatitis B is common. Ask your health care provider which countries are considered high risk.  Your parents were born in a high-risk country, and you have not been immunized against hepatitis B (hepatitis B vaccine).  You have HIV or AIDS.  You use needles to inject street drugs.  You live with someone who has hepatitis B.  You have had sex with someone who has hepatitis B.  You get hemodialysis treatment.  You take certain medicines for conditions, including cancer, organ transplantation, and autoimmune conditions. Hepatitis C  Blood testing is recommended for:  Everyone born from [birth year] through 1964.  Anyone with known risk factors for hepatitis C. Sexually transmitted infections (STIs)  You should be screened for sexually transmitted infections (STIs) including gonorrhea and chlamydia if:  You are sexually active and are younger than 53 years of age.  You are older than 53 years of age and your health care provider tells you that you are at risk for this type of infection.  Your sexual activity has changed since you were last screened and you are at an increased risk for chlamydia or gonorrhea. Ask your health care provider if you are at risk.  If you do not have HIV, but are at risk, it may be recommended that you take a prescription medicine daily to prevent HIV infection. This is called pre-exposure prophylaxis (PrEP). You are considered at risk if:  You are sexually active and do not regularly use condoms or know the HIV status of your partner(s).  You take drugs by injection.  You are sexually active with a partner who has HIV. Talk with your health care provider about whether you are at high risk of being infected with HIV. If you choose to begin PrEP, you should first be tested for HIV. You should then be tested every 3 months for  as long as you are taking PrEP.  PREGNANCY   If you are premenopausal and you may become pregnant, ask your health care provider about preconception counseling.  If you may become pregnant, take 400 to 800 micrograms (mcg) of folic acid every day.  If you want to prevent pregnancy, talk to your  health care provider about birth control (contraception). OSTEOPOROSIS AND MENOPAUSE   Osteoporosis is a disease in which the bones lose minerals and strength with aging. This can result in serious bone fractures. Your risk for osteoporosis can be identified using a bone density scan.  If you are 65 years of age or older, or if you are at risk for osteoporosis and fractures, ask your health care provider if you should be screened.  Ask your health care provider whether you should take a calcium or vitamin D supplement to lower your risk for osteoporosis.  Menopause may have certain physical symptoms and risks.  Hormone replacement therapy may reduce some of these symptoms and risks. Talk to your health care provider about whether hormone replacement therapy is right for you.  HOME CARE INSTRUCTIONS   Schedule regular health, dental, and eye exams.  Stay current with your immunizations.   Do not use any tobacco products including cigarettes, chewing tobacco, or electronic cigarettes.  If you are pregnant, do not drink alcohol.  If you are breastfeeding, limit how much and how often you drink alcohol.  Limit alcohol intake to no more than 1 drink per day for nonpregnant women. One drink equals 12 ounces of beer, 5 ounces of wine, or 1 ounces of hard liquor.  Do not use street drugs.  Do not share needles.  Ask your health care provider for help if you need support or information about quitting drugs.  Tell your health care provider if you often feel depressed.  Tell your health care provider if you have ever been abused or do not feel safe at home. Document Released: 02/12/2011  Document Revised: 12/14/2013 Document Reviewed: 07/01/2013 Exit[employer] Patient Information 2015 [employer]. This information is not intended to replace advice given to you by your health care provider. Make sure you discuss any questions you have with your health care provider. Insomnia Insomnia is frequent trouble falling and/or staying asleep. Insomnia can be a long term problem or a short term problem. Both are common. Insomnia can be a short term problem when the wakefulness is related to a certain stress or worry. Long term insomnia is often related to ongoing stress during waking hours and/or poor sleeping habits. Overtime, sleep deprivation itself can make the problem worse. Every little thing feels more severe because you are overtired and your ability to cope is decreased. CAUSES   Stress, anxiety, and depression.  Poor sleeping habits.  Distractions such as TV in the bedroom.  Naps close to bedtime.  Engaging in emotionally charged conversations before bed.  Technical reading before sleep.  Alcohol and other sedatives. They may make the problem worse. They can hurt normal sleep patterns and normal dream activity.  Stimulants such as caffeine for several hours prior to bedtime.  Pain syndromes and shortness of breath can cause insomnia.  Exercise late at night.  Changing time zones may cause sleeping problems (jet lag). It is sometimes helpful to have someone observe your sleeping patterns. They should look for periods of not breathing during the night (sleep apnea). They should also look to see how long those periods last. If you live alone or observers are uncertain, you can also be observed at a sleep clinic where your sleep patterns will be professionally monitored. Sleep apnea requires a checkup and treatment. Give your caregivers your medical history. Give your caregivers observations your family has made about your sleep.  SYMPTOMS   Not feeling rested in the  morning.  Anxiety   and restlessness at bedtime.  Difficulty falling and staying asleep. TREATMENT   Your caregiver may prescribe treatment for an underlying medical disorders. Your caregiver can give advice or help if you are using alcohol or other drugs for self-medication. Treatment of underlying problems will usually eliminate insomnia problems.  Medications can be prescribed for short time use. They are generally not recommended for lengthy use.  Over-the-counter sleep medicines are not recommended for lengthy use. They can be habit forming.  You can promote easier sleeping by making lifestyle changes such as:  Using relaxation techniques that help with breathing and reduce muscle tension.  Exercising earlier in the day.  Changing your diet and the time of your last meal. No night time snacks.  Establish a regular time to go to bed.  Counseling can help with stressful problems and worry.  Soothing music and white noise may be helpful if there are background noises you cannot remove.  Stop tedious detailed work at least one hour before bedtime. HOME CARE INSTRUCTIONS   Keep a diary. Inform your caregiver about your progress. This includes any medication side effects. See your caregiver regularly. Take note of:  Times when you are asleep.  Times when you are awake during the night.  The quality of your sleep.  How you feel the next day. This information will help your caregiver care for you.  Get out of bed if you are still awake after 15 minutes. Read or do some quiet activity. Keep the lights down. Wait until you feel sleepy and go back to bed.  Keep regular sleeping and waking hours. Avoid naps.  Exercise regularly.  Avoid distractions at bedtime. Distractions include watching television or engaging in any intense or detailed activity like attempting to balance the household checkbook.  Develop a bedtime ritual. Keep a familiar routine of bathing, brushing your  teeth, climbing into bed at the same time each night, listening to soothing music. Routines increase the success of falling to sleep faster.  Use relaxation techniques. This can be using breathing and muscle tension release routines. It can also include visualizing peaceful scenes. You can also help control troubling or intruding thoughts by keeping your mind occupied with boring or repetitive thoughts like the old concept of counting sheep. You can make it more creative like imagining planting one beautiful flower after another in your backyard garden.  During your day, work to eliminate stress. When this is not possible use some of the previous suggestions to help reduce the anxiety that accompanies stressful situations. MAKE SURE YOU:   Understand these instructions.  Will watch your condition.  Will get help right away if you are not doing well or get worse. Document Released: 07/27/2000 Document Revised: 10/22/2011 Document Reviewed: 08/27/2007 [facility] Patient Information 2015 [place], [place]. This information is not intended to replace advice given to you by your health care provider. Make sure you discuss any questions you have with your health care provider.

## 2014-09-24 ENCOUNTER — Other Ambulatory Visit: Payer: Self-pay | Admitting: Women's Health

## 2014-09-24 DIAGNOSIS — E559 Vitamin D deficiency, unspecified: Secondary | ICD-10-CM

## 2014-09-24 LAB — COMPREHENSIVE METABOLIC PANEL
ALT: 10 U/L (ref 0–35)
AST: 13 U/L (ref 0–37)
Albumin: 4 g/dL (ref 3.5–5.2)
Alkaline Phosphatase: 61 U/L (ref 39–117)
BUN: 14 mg/dL (ref 6–23)
CO2: 22 mEq/L (ref 19–32)
Calcium: 9.4 mg/dL (ref 8.4–10.5)
Chloride: 103 mEq/L (ref 96–112)
Creat: 0.73 mg/dL (ref 0.50–1.10)
Glucose, Bld: 92 mg/dL (ref 70–99)
Potassium: 4.2 mEq/L (ref 3.5–5.3)
Sodium: 138 mEq/L (ref 135–145)
Total Bilirubin: 0.9 mg/dL (ref 0.2–1.2)
Total Protein: 6.9 g/dL (ref 6.0–8.3)

## 2014-09-24 LAB — CBC WITH DIFFERENTIAL/PLATELET
Basophils Absolute: 0.1 10*3/uL (ref 0.0–0.1)
Basophils Relative: 1 % (ref 0–1)
Eosinophils Absolute: 0.3 10*3/uL (ref 0.0–0.7)
Eosinophils Relative: 4 % (ref 0–5)
HCT: 44 % (ref 36.0–46.0)
Hemoglobin: 14.8 g/dL (ref 12.0–15.0)
Lymphocytes Relative: 25 % (ref 12–46)
Lymphs Abs: 2 10*3/uL (ref 0.7–4.0)
MCH: 32.2 pg (ref 26.0–34.0)
MCHC: 33.6 g/dL (ref 30.0–36.0)
MCV: 95.7 fL (ref 78.0–100.0)
MPV: 9.8 fL (ref 8.6–12.4)
Monocytes Absolute: 0.6 10*3/uL (ref 0.1–1.0)
Monocytes Relative: 8 % (ref 3–12)
Neutro Abs: 4.8 10*3/uL (ref 1.7–7.7)
Neutrophils Relative %: 62 % (ref 43–77)
Platelets: 302 10*3/uL (ref 150–400)
RBC: 4.6 MIL/uL (ref 3.87–5.11)
RDW: 12.9 % (ref 11.5–15.5)
WBC: 7.8 10*3/uL (ref 4.0–10.5)

## 2014-09-24 LAB — URINALYSIS W MICROSCOPIC + REFLEX CULTURE
Bacteria, UA: NONE SEEN
Bilirubin Urine: NEGATIVE
Casts: NONE SEEN
Crystals: NONE SEEN
Glucose, UA: NEGATIVE mg/dL
Hgb urine dipstick: NEGATIVE
Leukocytes, UA: NEGATIVE
Nitrite: NEGATIVE
Protein, ur: NEGATIVE mg/dL
Specific Gravity, Urine: 1.02 (ref 1.005–1.030)
Squamous Epithelial / LPF: NONE SEEN
Urobilinogen, UA: 0.2 mg/dL (ref 0.0–1.0)
pH: 5 (ref 5.0–8.0)

## 2014-09-24 LAB — LIPID PANEL
Cholesterol: 156 mg/dL (ref 0–200)
HDL: 76 mg/dL (ref >39)
LDL Cholesterol: 63 mg/dL (ref 0–99)
Total CHOL/HDL Ratio: 2.1 Ratio
Triglycerides: 85 mg/dL (ref <150)
VLDL: 17 mg/dL (ref 0–40)

## 2014-09-24 LAB — VITAMIN D 25 HYDROXY (VIT D DEFICIENCY, FRACTURES): Vit D, 25-Hydroxy: 13 ng/mL — ABNORMAL LOW (ref 30–100)

## 2014-09-24 LAB — TSH: TSH: 0.588 u[IU]/mL (ref 0.350–4.500)

## 2014-09-24 MED ORDER — VITAMIN D (ERGOCALCIFEROL) 1.25 MG (50000 UNIT) PO CAPS
50000.0000 [IU] | ORAL_CAPSULE | ORAL | Status: DC
Start: 2014-09-24 — End: 2015-07-25

## 2014-12-17 ENCOUNTER — Other Ambulatory Visit: Payer: BC Managed Care – PPO

## 2014-12-17 DIAGNOSIS — E559 Vitamin D deficiency, unspecified: Secondary | ICD-10-CM

## 2014-12-18 LAB — VITAMIN D 25 HYDROXY (VIT D DEFICIENCY, FRACTURES): Vit D, 25-Hydroxy: 30 ng/mL (ref 30–100)

## 2015-01-12 ENCOUNTER — Encounter: Payer: Self-pay | Admitting: *Deleted

## 2015-02-24 ENCOUNTER — Other Ambulatory Visit: Payer: Self-pay

## 2015-02-24 DIAGNOSIS — G4700 Insomnia, unspecified: Secondary | ICD-10-CM

## 2015-02-24 MED ORDER — ALPRAZOLAM 1 MG PO TABS: 1.0000 mg | ORAL_TABLET | Freq: Every evening | ORAL | Status: DC | PRN

## 2015-02-24 NOTE — Telephone Encounter (Signed)
Called into pharmacy

## 2015-03-24 ENCOUNTER — Telehealth: Payer: Self-pay | Admitting: *Deleted

## 2015-03-24 NOTE — Telephone Encounter (Signed)
[place] [facility] called into [facility] voicemail on 03/24/15 regarding your fax for results. They stated they are on EPIC as well and the results should be able to be viewed there. If not then they do not have any results yet.

## 2015-03-24 NOTE — Telephone Encounter (Signed)
Faxed most recent mammo request to pt's gyn.  Will update once received.

## 2015-03-28 ENCOUNTER — Encounter: Payer: Self-pay | Admitting: *Deleted

## 2015-05-06 ENCOUNTER — Other Ambulatory Visit: Payer: Self-pay | Admitting: *Deleted

## 2015-05-06 DIAGNOSIS — G4700 Insomnia, unspecified: Secondary | ICD-10-CM

## 2015-05-06 MED ORDER — ALPRAZOLAM 1 MG PO TABS: 1.0000 mg | ORAL_TABLET | Freq: Every evening | ORAL | Status: DC | PRN

## 2015-05-06 NOTE — Telephone Encounter (Signed)
Rx called in 

## 2015-05-06 NOTE — Telephone Encounter (Signed)
Last filled on 02/24/15 with 1 refill

## 2015-05-12 ENCOUNTER — Telehealth: Payer: Self-pay | Admitting: Family Medicine

## 2015-07-19 NOTE — Telephone Encounter (Signed)
ERROR

## 2015-07-25 ENCOUNTER — Ambulatory Visit (INDEPENDENT_AMBULATORY_CARE_PROVIDER_SITE_OTHER): Payer: BC Managed Care – PPO | Admitting: Gynecology

## 2015-07-25 ENCOUNTER — Encounter: Payer: Self-pay | Admitting: Gynecology

## 2015-07-25 VITALS — BP 118/76

## 2015-07-25 DIAGNOSIS — N3001 Acute cystitis with hematuria: Secondary | ICD-10-CM

## 2015-07-25 LAB — URINALYSIS W MICROSCOPIC + REFLEX CULTURE
Bilirubin Urine: NEGATIVE
Casts: NONE SEEN [LPF]
Crystals: NONE SEEN [HPF]
Glucose, UA: NEGATIVE
Ketones, ur: NEGATIVE
Nitrite: NEGATIVE
Protein, ur: NEGATIVE
Specific Gravity, Urine: 1.015 (ref 1.001–1.035)
Yeast: NONE SEEN [HPF]
pH: 5.5 (ref 5.0–8.0)

## 2015-07-25 MED ORDER — NITROFURANTOIN MONOHYD MACRO 100 MG PO CAPS: 100.0000 mg | ORAL_CAPSULE | Freq: Two times a day (BID) | ORAL | Status: DC

## 2015-07-25 NOTE — Addendum Note (Signed)
Addended by: Nelva Nay on: 07/25/2015 03:02 PM   Modules accepted: Orders

## 2015-07-25 NOTE — Progress Notes (Addendum)
Alexandra Grant 07-28-1961 [id number]        54 y.o.  [id number] Presents with 1-1/2 day of mild dysuria with frank blood in her urine. No significant pain suprapubically or in her back. No fever or chills or vaginal discharge. Last UTI in her 40s. No history of renal lithiasis.  Past medical history,surgical history, problem list, medications, allergies, family history and social history were all reviewed and documented in the EPIC chart.  Directed ROS with pertinent positives and negatives documented in the history of present illness/assessment and plan.  Exam: Filed Vitals:   07/25/15 1421  BP: 118/76   General appearance:  Normal Spine straight without CVA tenderness Abdomen soft nontender without masses guarding rebound  Urinalysis shows 40-60 WBC, 40-60 RBC, many bacteria  Assessment/Plan:  54 y.o. [id number] with history and urinalysis consistent with hemorrhagic cystitis.  No symptoms to suggest renal lithiasis in the absence of pain or pyelonephritis. Will cover with Macrobid 100 mg twice a day 7 days. She has an appointment for her annual exam in January and I asked her to make sure she repeats a clean-catch urinalysis then to make sure that her hematuria clears and she agrees with this. She'll follow up sooner if her symptoms persist, worsen or recur.    Anastasio Auerbach MD, 2:32 PM 07/25/2015

## 2015-07-25 NOTE — Patient Instructions (Signed)
Take the oral antibiotic twice daily for 7 days. Repeat your urine analysis I your annual exam appointment in January to make sure that your blood in the urine clears. Follow up sooner if your symptoms persist, worsen or recur.

## 2015-07-26 ENCOUNTER — Other Ambulatory Visit: Payer: Self-pay | Admitting: *Deleted

## 2015-07-26 DIAGNOSIS — G4700 Insomnia, unspecified: Secondary | ICD-10-CM

## 2015-07-26 LAB — URINE CULTURE: Colony Count: 5000

## 2015-07-26 MED ORDER — ALPRAZOLAM 1 MG PO TABS: 1.0000 mg | ORAL_TABLET | Freq: Every evening | ORAL | Status: DC | PRN

## 2015-07-26 NOTE — Telephone Encounter (Signed)
Rx called in 

## 2015-07-26 NOTE — Telephone Encounter (Signed)
Pt would like refills on Rx, last filled on 05/06/15 with 1 refill.

## 2015-09-23 ENCOUNTER — Encounter: Payer: Self-pay | Admitting: Women's Health

## 2015-10-12 ENCOUNTER — Encounter: Payer: Self-pay | Admitting: Women's Health

## 2015-10-12 ENCOUNTER — Ambulatory Visit (INDEPENDENT_AMBULATORY_CARE_PROVIDER_SITE_OTHER): Payer: BC Managed Care – PPO | Admitting: Women's Health

## 2015-10-12 VITALS — BP 132/80 | Ht 63.75 in | Wt 158.0 lb

## 2015-10-12 DIAGNOSIS — Z13220 Encounter for screening for lipoid disorders: Secondary | ICD-10-CM | POA: Diagnosis not present

## 2015-10-12 DIAGNOSIS — Z23 Encounter for immunization: Secondary | ICD-10-CM | POA: Diagnosis not present

## 2015-10-12 DIAGNOSIS — G4700 Insomnia, unspecified: Secondary | ICD-10-CM

## 2015-10-12 DIAGNOSIS — Z01419 Encounter for gynecological examination (general) (routine) without abnormal findings: Secondary | ICD-10-CM

## 2015-10-12 DIAGNOSIS — F17210 Nicotine dependence, cigarettes, uncomplicated: Secondary | ICD-10-CM

## 2015-10-12 LAB — CBC WITH DIFFERENTIAL/PLATELET
Basophils Absolute: 0.1 10*3/uL (ref 0.0–0.1)
Basophils Relative: 1 % (ref 0–1)
Eosinophils Absolute: 0.2 10*3/uL (ref 0.0–0.7)
Eosinophils Relative: 3 % (ref 0–5)
HCT: 41.8 % (ref 36.0–46.0)
Hemoglobin: 14.1 g/dL (ref 12.0–15.0)
Lymphocytes Relative: 25 % (ref 12–46)
Lymphs Abs: 1.8 10*3/uL (ref 0.7–4.0)
MCH: 31.8 pg (ref 26.0–34.0)
MCHC: 33.7 g/dL (ref 30.0–36.0)
MCV: 94.4 fL (ref 78.0–100.0)
MPV: 9.6 fL (ref 8.6–12.4)
Monocytes Absolute: 0.7 10*3/uL (ref 0.1–1.0)
Monocytes Relative: 10 % (ref 3–12)
Neutro Abs: 4.3 10*3/uL (ref 1.7–7.7)
Neutrophils Relative %: 61 % (ref 43–77)
Platelets: 263 10*3/uL (ref 150–400)
RBC: 4.43 MIL/uL (ref 3.87–5.11)
RDW: 12.3 % (ref 11.5–15.5)
WBC: 7.1 10*3/uL (ref 4.0–10.5)

## 2015-10-12 LAB — LIPID PANEL
Cholesterol: 155 mg/dL (ref 125–200)
HDL: 69 mg/dL (ref >=46)
LDL Cholesterol: 63 mg/dL (ref <130)
Total CHOL/HDL Ratio: 2.2 Ratio (ref <=5.0)
Triglycerides: 117 mg/dL (ref <150)
VLDL: 23 mg/dL (ref <30)

## 2015-10-12 LAB — COMPREHENSIVE METABOLIC PANEL
ALT: 13 U/L (ref 6–29)
AST: 14 U/L (ref 10–35)
Albumin: 4.2 g/dL (ref 3.6–5.1)
Alkaline Phosphatase: 68 U/L (ref 33–130)
BUN: 17 mg/dL (ref 7–25)
CO2: 27 mmol/L (ref 20–31)
Calcium: 8.8 mg/dL (ref 8.6–10.4)
Chloride: 101 mmol/L (ref 98–110)
Creat: 0.91 mg/dL (ref 0.50–1.05)
Glucose, Bld: 93 mg/dL (ref 65–99)
Potassium: 3.7 mmol/L (ref 3.5–5.3)
Sodium: 140 mmol/L (ref 135–146)
Total Bilirubin: 0.7 mg/dL (ref 0.2–1.2)
Total Protein: 7 g/dL (ref 6.1–8.1)

## 2015-10-12 MED ORDER — ALPRAZOLAM 1 MG PO TABS: 1.0000 mg | ORAL_TABLET | Freq: Every evening | ORAL | Status: DC | PRN

## 2015-10-12 NOTE — Patient Instructions (Signed)

## 2015-10-12 NOTE — Progress Notes (Signed)
Alexandra Grant [birth year] [id number]    History:    Presents for annual exam.  Postmenopausal, greater than one year with no bleeding and occasional hot flushes. Normal Pap and mammogram history. Negative colonoscopy 2015. Biggist problem is insomnia.  Past medical history, past surgical history, family history and social history were all reviewed and documented in the EPIC chart. 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 Maternal grandmother in her 42s, mother hypertension, heart disease and osteoporosis/smoker.  ROS:  A ROS was performed and pertinent positives and negatives are included.  Exam:  Filed Vitals:   10/12/15 1432  BP: 132/80    General appearance:  Normal Thyroid:  Symmetrical, normal in size, without palpable masses or nodularity. Respiratory  Auscultation:  Clear without wheezing or rhonchi Cardiovascular  Auscultation:  Regular rate, without rubs, murmurs or gallops  Edema/varicosities:  Not grossly evident Abdominal  Soft,nontender, without masses, guarding or rebound.  Liver/spleen:  No organomegaly noted  Hernia:  None appreciated  Skin  Inspection:  Grossly normal   Breasts: Examined lying and sitting.     Right: Without masses, retractions, discharge or axillary adenopathy.     Left: Without masses, retractions, discharge or axillary adenopathy. Gentitourinary   Inguinal/mons:  Normal without inguinal adenopathy  External genitalia:  Normal  BUS/Urethra/Skene's glands:  Normal  Vagina:  Normal  Cervix:  Normal  Uterus:   normal in size, shape and contour.  Midline and mobile  Adnexa/parametria:     Rt: Without masses or tenderness.   Lt: Without masses or tenderness.  Anus and perineum: Normal  Digital rectal exam: Normal sphincter tone without palpated masses or tenderness  Assessment/Plan:  55 y.o. M WF G2 P2  for annual exam with  insomnia  Postmenopausal with no bleeding on no HRT Insomnia  Plan: Sleep hygiene reviewed, Xanax 1 mg half tablet at bedtime when necessary prescription, proper use given and reviewed. Reviewed addictive properties of Xanax, use sparingly. SBE's, continue annual screening mammogram, (one prior to this year 2013) regular exercise, calcium rich diet, vitamin D 1000 daily encouraged. CBC, lipid panel, CMP, UA, Pap with HR HPV typing, new screening guidelines reviewed. T dap.  [name] [facility], 3:05 PM 10/12/2015

## 2015-10-12 NOTE — Addendum Note (Signed)
Addended by: Thurnell Garbe A on: 10/12/2015 03:25 PM   Modules accepted: Orders, SmartSet

## 2015-10-13 ENCOUNTER — Other Ambulatory Visit: Payer: Self-pay | Admitting: Women's Health

## 2015-10-13 LAB — PAP IG AND HPV HIGH-RISK: HPV DNA High Risk: NOT DETECTED

## 2015-10-13 LAB — VITAMIN D 25 HYDROXY (VIT D DEFICIENCY, FRACTURES): Vit D, 25-Hydroxy: 16 ng/mL — ABNORMAL LOW (ref 30–100)

## 2015-10-13 MED ORDER — VITAMIN D (ERGOCALCIFEROL) 1.25 MG (50000 UNIT) PO CAPS: 50000.0000 [IU] | ORAL_CAPSULE | ORAL | Status: DC

## 2015-12-05 ENCOUNTER — Other Ambulatory Visit: Payer: Self-pay

## 2015-12-05 DIAGNOSIS — G4700 Insomnia, unspecified: Secondary | ICD-10-CM

## 2015-12-06 MED ORDER — ALPRAZOLAM 1 MG PO TABS: 1.0000 mg | ORAL_TABLET | Freq: Every evening | ORAL | Status: DC | PRN

## 2015-12-06 NOTE — Telephone Encounter (Signed)
Called into pharmacy

## 2015-12-06 NOTE — Telephone Encounter (Signed)
Ok for refill? 

## 2016-02-08 ENCOUNTER — Ambulatory Visit: Payer: BC Managed Care – PPO | Admitting: Women's Health

## 2016-02-10 ENCOUNTER — Ambulatory Visit (INDEPENDENT_AMBULATORY_CARE_PROVIDER_SITE_OTHER): Payer: BC Managed Care – PPO | Admitting: Women's Health

## 2016-02-10 ENCOUNTER — Encounter: Payer: Self-pay | Admitting: Women's Health

## 2016-02-10 VITALS — BP 129/75

## 2016-02-10 DIAGNOSIS — E559 Vitamin D deficiency, unspecified: Secondary | ICD-10-CM | POA: Diagnosis not present

## 2016-02-10 NOTE — Progress Notes (Signed)
Patient ID: Alexandra Grant, female   DOB: 09-21-1960, 55 y.o.   MRN: [id number] Presents with several issues. Has had increased hot flushes. Has had one cycle in the last 8 months had a 5 day cycle in March and since then has had increased hot flushes. Vitamin D level was low she had taken 50,000 IUs weekly for 12 weeks. Has a questionable lump on her lower right back. Painless, denies injury or change in routine. Denies urinary symptoms, back pain, vaginal discharge or fever.  Exam: Appears well. No CVAT, no palpable nodules in sitting position, palpable prominent lower rib on right side only when standing and leaning forward slightly none when standing straight. No change in color, nontender.  Probable prominent right-sided lower rib Vitamin D deficient Perimenopausal   Plan: Vitamin D lab will check. Continue over-the-counter vitamin D 2000 daily. Reviewed most likely prominent right-sided lower rib, instructed to call if changes will refer to orthopedist. Menopause reviewed, reviewed definition of menopause no longer having a cycle for one year, will watch at this time. HRT reviewed, declines will try vitamin D twice daily for hot flushes. Instructed to call if further bleeding.

## 2016-02-11 LAB — VITAMIN D 25 HYDROXY (VIT D DEFICIENCY, FRACTURES): Vit D, 25-Hydroxy: 32 ng/mL (ref 30–100)

## 2016-03-30 ENCOUNTER — Other Ambulatory Visit: Payer: Self-pay | Admitting: Family Medicine

## 2016-04-03 ENCOUNTER — Other Ambulatory Visit: Payer: Self-pay | Admitting: Family Medicine

## 2016-04-10 ENCOUNTER — Ambulatory Visit (INDEPENDENT_AMBULATORY_CARE_PROVIDER_SITE_OTHER): Payer: BC Managed Care – PPO | Admitting: Family Medicine

## 2016-04-10 VITALS — BP 122/72 | HR 58 | Temp 98.1°F | Resp 17 | Ht 66.5 in | Wt 157.0 lb

## 2016-04-10 DIAGNOSIS — J4520 Mild intermittent asthma, uncomplicated: Secondary | ICD-10-CM | POA: Diagnosis not present

## 2016-04-10 DIAGNOSIS — Z23 Encounter for immunization: Secondary | ICD-10-CM | POA: Diagnosis not present

## 2016-04-10 MED ORDER — ALBUTEROL SULFATE 108 (90 BASE) MCG/ACT IN AEPB: 2.0000 | INHALATION_SPRAY | RESPIRATORY_TRACT | 2 refills | Status: DC | PRN

## 2016-04-10 NOTE — Patient Instructions (Signed)
     IF you received an x-ray today, you will receive an invoice from [facility]. Please contact [facility] at [phone] with questions or concerns regarding your invoice.   IF you received labwork today, you will receive an invoice from [facility]. Please contact [facility] at [phone] with questions or concerns regarding your invoice.   Our billing staff will not be able to assist you with questions regarding bills from these companies.  You will be contacted with the lab results as soon as they are available. The fastest way to get your results is to activate your My Chart account. Instructions are located on the last page of this paperwork. If you have not heard from us regarding the results in 2 weeks, please contact this office.      

## 2016-04-11 NOTE — Progress Notes (Addendum)
Subjective:  By signing my name below, I, Moises Blood, attest that this documentation has been prepared under the direction and in the presence of Delman Cheadle, MD. Electronically Signed: Moises Blood, [place]. 04/11/2016 , 4:49 PM .  Patient was seen in Room 10 .   Patient ID: Alexandra Grant, female    DOB: [birth year], 55 y.o.   MRN: [id number] Chief Complaint  Patient presents with  . Medication Refill    inhaler   . Immunizations    flu shot    Medication Refill  Pertinent negatives include no abdominal pain, chills, congestion, coughing, fatigue, fever, nausea or vomiting.   Brigitte SUDA WILLS is a 55 y.o. female who presents to [facility] requesting refill for her inhaler. She has slight history of asthma with onset of menopause. She noticed not being able to breathe well after eating a big supper at night. She finds relief after standing up and using her inhaler. She denies history of GERD or reflux. She's former smoker; quit when she started having breathing issues.   She was taking a higher dose of Vitamin D 50000 units instructed by her OBGYN Elon Alas), but now down to OTC dose of 2000 units daily. She also takes half tablet of xanax to help with sleep; she's been on it for a while. She was going to be tried on [place] but patient declined.   She also notes questionable lump on her lower right back. She denies any pain in the area. She denies injury or change in routine. She denies urinary symptoms, back pain, or fever.   She also requests a flu shot today.   Past Medical History:  Diagnosis Date  . Menorrhagia 2014   Prior to Admission medications   Medication Sig Start Date End Date Taking? Authorizing Provider  albuterol (PROVENTIL) (2.5 MG/3ML) 0.083% nebulizer solution Take 2.5 mg by nebulization every 6 (six) hours as needed for wheezing or shortness of breath.   Yes Historical Provider, MD  ALPRAZolam Duanne Moron) 1 MG tablet Take 1 tablet (1 mg total) by mouth at bedtime as  needed for anxiety. 12/06/15  Yes [name], NP  Ibuprofen (ADVIL PO) Take by mouth as needed. Reported on 10/12/2015    Historical Provider, MD  nitrofurantoin, macrocrystal-monohydrate, (MACROBID) 100 MG capsule Take 1 capsule (100 mg total) by mouth 2 (two) times daily. For 7 days Patient not taking: Reported on 10/12/2015 07/25/15   Anastasio Auerbach, MD  Vitamin D, Ergocalciferol, (DRISDOL) 50000 units CAPS capsule Take 1 capsule (50,000 Units total) by mouth every 7 (seven) days. Patient not taking: Reported on 02/10/2016 10/13/15   [name], NP   No Known Allergies   Review of Systems  Constitutional: Negative for chills, fatigue and fever.  HENT: Negative for congestion.   Respiratory: Negative for cough, shortness of breath and wheezing.   Gastrointestinal: Negative for abdominal pain, diarrhea, nausea and vomiting.  Genitourinary: Negative for dysuria, frequency and hematuria.  Musculoskeletal: Negative for back pain.       Objective:   Physical Exam  Constitutional: She is oriented to person, place, and time. She appears well-developed and well-nourished. No distress.  HENT:  Head: Normocephalic and atraumatic.  Eyes: EOM are normal. Pupils are equal, round, and reactive to light.  Neck: Neck supple.  Cardiovascular: Normal rate, regular rhythm, S1 normal, S2 normal and normal heart sounds.   No murmur heard. Pulmonary/Chest: Effort normal and breath sounds normal. No respiratory distress.  Abdominal: Normal appearance.  There is no hepatosplenomegaly. There is no tenderness. There is no CVA tenderness. No hernia.  Pt was concerned about a perceived mass over her right flank - I could not feel anything distinct beyond perhaps some muscle spasms and the lower floating rib.  Musculoskeletal: Normal range of motion.  Neurological: She is alert and oriented to person, place, and time.  Skin: Skin is warm and dry.  Psychiatric: She has a normal mood and affect. Her behavior  is normal.  Nursing note and vitals reviewed.   BP 122/72 (BP Location: Right Arm, Patient Position: Sitting, Cuff Size: Normal)   Pulse (!) 58   Temp 98.1 F (36.7 C) (Oral)   Resp 17   Ht 5' 6.5" (1.689 m)   Wt 157 lb (71.2 kg)   SpO2 99%   BMI 24.96 kg/m     Assessment & Plan:   1. Asthma, mild intermittent, uncomplicated   2. Need for prophylactic vaccination and inoculation against influenza    Poss right flank mass - I suspect this is a floating rib - it is possible that she has an extra. Pt reassured as this is apparently the same thing she had been told by her gynecologist upon eval. Offered xray though this is unlikely be helpful as the white rib will overlap the white liver, diaphragm, kidney.  If changes, could cons [place] vs CT.  Using xanax 1/2 tab qhs for sleep for the past sev mos since having more insomnia since menopause started, rx by gyn  on otc vit d supp.  Orders Placed This Encounter  Procedures  . Flu Vaccine QUAD 36+ mos IM    Meds ordered this encounter  Medications  . DISCONTD: albuterol (PROVENTIL) (2.5 MG/3ML) 0.083% nebulizer solution    Sig: Take 2.5 mg by nebulization every 6 (six) hours as needed for wheezing or shortness of breath.  . Albuterol Sulfate (PROAIR RESPICLICK) 123XX123 (90 Base) MCG/ACT AEPB    Sig: Inhale 2 puffs into the lungs every 4 (four) hours as needed.    Dispense:  1 each    Refill:  2    Ok to use whatever albuterol inhaler works best for [employer]    I personally performed the services described in this documentation, which was scribed in my presence. The recorded information has been reviewed and considered, and addended by me as needed.   Delman Cheadle, M.D.  [facility] [address] [place], [place] [address] [phone] phone [phone] fax  04/11/16 11:23 AM

## 2016-04-23 ENCOUNTER — Emergency Department (HOSPITAL_COMMUNITY): Payer: BC Managed Care – PPO

## 2016-04-23 ENCOUNTER — Encounter (HOSPITAL_COMMUNITY): Payer: Self-pay

## 2016-04-23 ENCOUNTER — Ambulatory Visit: Payer: BC Managed Care – PPO

## 2016-04-23 ENCOUNTER — Emergency Department (HOSPITAL_COMMUNITY)
Admission: EM | Admit: 2016-04-23 | Discharge: 2016-04-23 | Disposition: A | Discharge status: Home / Self Care | Payer: BC Managed Care – PPO | Attending: Emergency Medicine | Admitting: Emergency Medicine

## 2016-04-23 DIAGNOSIS — Z7982 Long term (current) use of aspirin: Secondary | ICD-10-CM | POA: Insufficient documentation

## 2016-04-23 DIAGNOSIS — Z87891 Personal history of nicotine dependence: Secondary | ICD-10-CM | POA: Diagnosis not present

## 2016-04-23 DIAGNOSIS — I4891 Unspecified atrial fibrillation: Secondary | ICD-10-CM

## 2016-04-23 DIAGNOSIS — R008 Other abnormalities of heart beat: Secondary | ICD-10-CM | POA: Diagnosis present

## 2016-04-23 LAB — CBC WITH DIFFERENTIAL/PLATELET
Basophils Absolute: 0.1 10*3/uL (ref 0.0–0.1)
Basophils Relative: 1 %
Eosinophils Absolute: 0.1 10*3/uL (ref 0.0–0.7)
Eosinophils Relative: 1 %
HCT: 42 % (ref 36.0–46.0)
Hemoglobin: 14.3 g/dL (ref 12.0–15.0)
Lymphocytes Relative: 27 %
Lymphs Abs: 1.4 10*3/uL (ref 0.7–4.0)
MCH: 31.9 pg (ref 26.0–34.0)
MCHC: 34 g/dL (ref 30.0–36.0)
MCV: 93.8 fL (ref 78.0–100.0)
Monocytes Absolute: 0.5 10*3/uL (ref 0.1–1.0)
Monocytes Relative: 9 %
Neutro Abs: 2.9 10*3/uL (ref 1.7–7.7)
Neutrophils Relative %: 62 %
Platelets: 246 10*3/uL (ref 150–400)
RBC: 4.48 MIL/uL (ref 3.87–5.11)
RDW: 11.9 % (ref 11.5–15.5)
WBC: 5 10*3/uL (ref 4.0–10.5)

## 2016-04-23 LAB — BASIC METABOLIC PANEL
Anion gap: 7 (ref 5–15)
BUN: 8 mg/dL (ref 6–20)
CO2: 25 mmol/L (ref 22–32)
Calcium: 9.3 mg/dL (ref 8.9–10.3)
Chloride: 110 mmol/L (ref 101–111)
Creatinine, Ser: 0.76 mg/dL (ref 0.44–1.00)
GFR calc Af Amer: >60 mL/min (ref >60)
GFR calc non Af Amer: >60 mL/min (ref >60)
Glucose, Bld: 107 mg/dL — ABNORMAL HIGH (ref 65–99)
Potassium: 3.8 mmol/L (ref 3.5–5.1)
Sodium: 142 mmol/L (ref 135–145)

## 2016-04-23 LAB — I-STAT TROPONIN, ED: Troponin i, poc: 0.01 ng/mL (ref 0.00–0.08)

## 2016-04-23 LAB — PROTIME-INR
INR: 1.04
Prothrombin Time: 13.6 seconds (ref 11.4–15.2)

## 2016-04-23 LAB — MAGNESIUM: Magnesium: 2.1 mg/dL (ref 1.7–2.4)

## 2016-04-23 LAB — TSH: TSH: 0.449 u[IU]/mL (ref 0.350–4.500)

## 2016-04-23 MED ORDER — DILTIAZEM HCL ER COATED BEADS 120 MG PO CP24: 120.0000 mg | ORAL_CAPSULE | Freq: Every day | ORAL | 0 refills | Status: DC

## 2016-04-23 MED ORDER — DILTIAZEM HCL 30 MG PO TABS: 30.0000 mg | ORAL_TABLET | Freq: Three times a day (TID) | ORAL | 0 refills | Status: DC | PRN

## 2016-04-23 MED ORDER — ASPIRIN EC 81 MG PO TBEC: 81.0000 mg | DELAYED_RELEASE_TABLET | Freq: Every day | ORAL | 0 refills | Status: DC

## 2016-04-23 MED ORDER — DILTIAZEM HCL ER COATED BEADS 120 MG PO CP24
120.0000 mg | ORAL_CAPSULE | Freq: Once | ORAL | Status: AC
  Administered 2016-04-23: 120 mg via ORAL
  Filled 2016-04-23: qty 1

## 2016-04-23 NOTE — ED Triage Notes (Signed)
Pt reports she was standing at her stove cooking when she had a sudden onset of chest pressure, dizziness and SOB.  Upon EMS arrival pt HR was 240.  Pt given a total of 18mg  Adenosine and 20mg  Cardizem.  Pt in NSR upon arrival to the ED.  Pt reports relief of symptoms at this time and denies any hx.  Pt does report feeling like she had the flu at the end of last week.

## 2016-04-23 NOTE — ED Provider Notes (Signed)
[facility] DEPT Provider Note   CSN: [id number] Arrival date & time: 04/23/16  1800     History   Chief Complaint Chief Complaint  Patient presents with  . Irregular Heart Beat    HPI Alexandra Grant is a 55 y.o. female.  HPI 54 yo F with reported PMHx of EAT who presents with palpitations. Pt states that over the last several months, she has had recurrent episodes of SOB and sensation of palpitations. She has seen a cardiologist previously for this and had reported EAT with junctional rhythms, but is not on any medications. Earlier today, she felt acute onset of palpitations followed by lightheadedness, SOB, and a sensation that she was going to pass out. No CP. This was worse than her prior episodes. Her husband, an Therapist, sports and paramedic, took her pulse and it was 220. EMS was called. Per EMS report, pt was in SVT on their arrival and was given adenosine 6 mg then 12 mg with conversion to AFib with RVR. She was then given a diltiazem bolus and converted to NSR. She has been in NSR since then. Currently she denies any complaints. No recent med changes. No increased EtOH or caffeine use.  Past Medical History:  Diagnosis Date  . Menorrhagia 2014    Patient Active Problem List   Diagnosis Date Noted  . Dyspnea 05/18/2014  . 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 Perimenopausal 04/27/2013  . DUB (dysfunctional uterine bleeding) 04/27/2013    History reviewed. No pertinent surgical history.  OB History    Gravida Para Term Preterm AB Living   2 2 2     2    SAB TAB Ectopic Multiple Live Births                   Home Medications    Prior to Admission medications   Medication Sig Start Date End Date Taking? Authorizing Provider  Albuterol Sulfate (PROAIR RESPICLICK) 123XX123 (90 Base) MCG/ACT AEPB Inhale 2 puffs into the lungs every 4 (four) hours as needed. 04/10/16  Yes Shawnee Knapp, MD  ALPRAZolam Duanne Moron) 1 MG tablet Take 1 tablet (1 mg total) by mouth at bedtime as needed for  anxiety. Patient taking differently: Take 0.5 mg by mouth at bedtime.  12/06/15  Yes [name], NP  Cholecalciferol 1000 units capsule Take 1,000 Units by mouth daily.   Yes Historical Provider, MD  ibuprofen (ADVIL,MOTRIN) 200 MG tablet Take 800 mg by mouth every 6 (six) hours as needed for headache or moderate pain.   Yes Historical Provider, MD  aspirin EC 81 MG tablet Take 1 tablet (81 mg total) by mouth daily. 04/23/16   Duffy Bruce, MD  diltiazem (CARDIZEM CD) 120 MG 24 hr capsule Take 1 capsule (120 mg total) by mouth daily. 04/23/16   Duffy Bruce, MD  diltiazem (CARDIZEM) 30 MG tablet Take 1 tablet (30 mg total) by mouth 3 (three) times daily as needed. At onset of atrial fibrillation/fast heart rate of > 100 for > 5 minutes 04/23/16   Duffy Bruce, MD    Family History Family History  Problem Relation Age of Onset  . Hypertension Mother   . Heart disease Mother     stent placement  . Hyperlipidemia Mother   . Diabetes Maternal Aunt   . Heart disease Maternal Grandmother     Social History Social History  Substance Use Topics  . Smoking status: Former Smoker    Years: 20.00    Types: Cigarettes  .  Smokeless tobacco: Never Used     Comment: QUIT 2015  . Alcohol use 8.4 oz/week    14 Standard drinks or equivalent per week     Allergies   Review of patient's allergies indicates no known allergies.   Review of Systems Review of Systems  Constitutional: Negative for chills and fever.  HENT: Negative for congestion, rhinorrhea and sore throat.   Eyes: Negative for visual disturbance.  Respiratory: Negative for cough, shortness of breath and wheezing.   Cardiovascular: Negative for chest pain and leg swelling.  Gastrointestinal: Negative for abdominal pain, diarrhea, nausea and vomiting.  Genitourinary: Negative for dysuria, flank pain, vaginal bleeding and vaginal discharge.  Musculoskeletal: Negative for neck pain.  Skin: Negative for rash.    Allergic/Immunologic: Negative for immunocompromised state.  Neurological: Negative for syncope and headaches.  Hematological: Does not bruise/bleed easily.  All other systems reviewed and are negative.    Physical Exam Updated Vital Signs BP 122/74   Pulse 69   Temp 98.7 F (37.1 C) (Oral)   Resp 19   Ht 5\' 6"  (1.676 m)   Wt 157 lb (71.2 kg)   SpO2 96%   BMI 25.34 kg/m   Physical Exam  Constitutional: She is oriented to person, place, and time. She appears well-developed and well-nourished. No distress.  HENT:  Head: Normocephalic and atraumatic.  Eyes: Conjunctivae are normal.  Neck: Neck supple.  Cardiovascular: Normal rate, regular rhythm, normal heart sounds and intact distal pulses.  Exam reveals no friction rub.   No murmur heard. Pulmonary/Chest: Effort normal and breath sounds normal. No respiratory distress. She has no wheezes. She has no rales.  Abdominal: She exhibits no distension.  Musculoskeletal: She exhibits no edema.  Neurological: She is alert and oriented to person, place, and time. She exhibits normal muscle tone.  Skin: Skin is warm. Capillary refill takes less than 2 seconds.  Psychiatric: She has a normal mood and affect.  Nursing note and vitals reviewed.    ED Treatments / Results  Labs (all labs ordered are listed, but only abnormal results are displayed) Labs Reviewed  BASIC METABOLIC PANEL - Abnormal; Notable for the following:       Result Value   Glucose, Bld 107 (*)    All other components within normal limits  CBC WITH DIFFERENTIAL/PLATELET  MAGNESIUM  PROTIME-INR  TSH  I-STAT TROPOININ, ED    EKG  EKG Interpretation  Date/Time:  Monday April 23 2016 18:01:40 EDT Ventricular Rate:  78 PR Interval:    QRS Duration: 90 QT Interval:  390 QTC Calculation: 445 R Axis:   52 Text Interpretation:  Sinus rhythm PAC noted No old tracing to compare Confirmed by [place] MD, Miley Lindon [id number]) on 04/24/2016 12:50:00 PM        Radiology Dg Chest 2 View  Result Date: 04/23/2016 CLINICAL DATA:  55 y/o F; tachycardia and some difficulty breathing. EXAM: CHEST  2 VIEW COMPARISON:  03/03/2014 chest radiograph. FINDINGS: The heart size and mediastinal contours are within normal limits. Both lungs are clear. The visualized skeletal structures are unremarkable. IMPRESSION: No active cardiopulmonary disease. Electronically Signed   By: Kristine Garbe M.D.   On: 04/23/2016 21:12    Procedures Procedures (including critical care time)  Medications Ordered in ED Medications  diltiazem (CARDIZEM CD) 24 hr capsule 120 mg (120 mg Oral Given 04/23/16 2235)     Initial Impression / Assessment and Plan / ED Course  I have reviewed the triage vital signs and the  nursing notes.  Pertinent labs & imaging results that were available during my care of the patient were reviewed by me and considered in my medical decision making (see chart for details).  Clinical Course   55 yo F with PMHx of EAT who p/w acute onset palpitations, found to have acute, stable, narrow complex tachycardia on EMS arrival. Pt now converted to NSR after adenosine 6 mg x 1, 12 mg x 1, and diltiazem bolus. Tele strips from EMS reviewed - unclear if primary arrhythmia was AVNRT versus AFib with RVR. Currently, pt is asymptomatic and well-appearing. Exam is unremarkable - heart now RRR, pulses symmetric. No focal neurological deficits. Will check screening labs to identify possible triggers, and c/s Cardiology given h/o EAT, complicated rhythm.  Labs unremarkable. No anemia. No electrolyte abnormalities. TSH is WNL. Pt remains in NSR and is ambulatory without difficulty. Cardiology has evaluated, and suspects primary AFib with RVR episode. CHADS-Vasc as below:  CHA2Ds2-VASc Score for Atrial Fibrillation    Patient Score  Age <65 = 0 65-74 = 1 > 75 = 2 0  Sex Female = 0 Female = 1 1  CHF History No = 0  Yes = 1 0  HTN History No = 0  Yes = 1  0  Stroke/TIA/TE History No = 0  Yes = 1 0  Vascular Disease History No = 0  Yes = 1 0  Diabetes History No = 0  Yes = 1 0  Total:  1   0.6 % stroke rate/year from a score of 1  Per discussion with Cardiology, will start pt on diltiazem ER and refer for outpt follow-up. Cardiology Dr. Tommi Rumps does not recommend anticoagulation at this time given CHADS-Vasc of 1. I discussed our AFib pathway with pt and husband and recommended starting Xarelto as per outpt recommendations. However, at this time, and based on shared decision making, pt declines systemic anticoagulation. Will refer her to AFib clinic.   Final Clinical Impressions(s) / ED Diagnoses   Final diagnoses:  Atrial fibrillation with RVR ([place])    New Prescriptions Discharge Medication List as of 04/23/2016 10:40 PM    START taking these medications   Details  aspirin EC 81 MG tablet Take 1 tablet (81 mg total) by mouth daily., Starting Mon 04/23/2016, Print    diltiazem (CARDIZEM CD) 120 MG 24 hr capsule Take 1 capsule (120 mg total) by mouth daily., Starting Mon 04/23/2016, Print    diltiazem (CARDIZEM) 30 MG tablet Take 1 tablet (30 mg total) by mouth 3 (three) times daily as needed. At onset of atrial fibrillation/fast heart rate of > 100 for > 5 minutes, Starting Mon 04/23/2016, Print         Duffy Bruce, MD 04/24/16 1255
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
## 2016-04-23 NOTE — ED Notes (Signed)
Patient transported to X-ray 

## 2016-04-23 NOTE — ED Notes (Signed)
Cardiology at bedside.

## 2016-04-27 ENCOUNTER — Encounter: Payer: Self-pay | Admitting: Internal Medicine

## 2016-04-27 ENCOUNTER — Ambulatory Visit (INDEPENDENT_AMBULATORY_CARE_PROVIDER_SITE_OTHER): Payer: BC Managed Care – PPO | Admitting: Internal Medicine

## 2016-04-27 VITALS — BP 118/84 | HR 59 | Ht 66.5 in | Wt 151.6 lb

## 2016-04-27 DIAGNOSIS — I471 Supraventricular tachycardia: Secondary | ICD-10-CM | POA: Diagnosis not present

## 2016-04-27 NOTE — Patient Instructions (Signed)
Medication Instructions:  Your physician recommends that you continue on your current medications as directed. Please refer to the Current Medication list given to you today.  **Continue Cardizem as ordered, may take additional dose for breakthrough palpitations**  Labwork: none  Testing/Procedures: none  Follow-Up: Your physician wants you to follow-up in: 6 months with Dr. Lovena Le. You will receive a reminder letter in the mail two months in advance. If you don't receive a letter, please call our office to schedule the follow-up appointment.   Any Other Special Instructions Will Be Listed Below (If Applicable).     If you need a refill on your cardiac medications before your next appointment, please call your pharmacy.

## 2016-04-27 NOTE — Progress Notes (Signed)
HPI Alexandra Grant is referred today for evaluation of palpitations, SVT and atrial fib. She is a pleasant 55 yo woman who has had palpitations for over 20 years. These have been well enough controlled until the past few months when they have increased in frequency and severity. The patient was seen in the ED after presenting with severe sob and near syncope and was reported to have SVT over 200/min. The tracings were obtained by paramedics. Unfortunately, the patient only has a rhythm strip on her phone for me to review which demonstrates atrial fib with a RVR. It is unclear as to how her heart rhythm progressed although her h/o sounds like she may have had SVT, been given adenosine by the paramedics and then reverted to atrial fib. With IV cardizem she went back to NSR. Her CHADSVASC score was 1. She had felt better on oral cardizem and had no significant symptoms.  No Known Allergies   Current Outpatient Prescriptions  Medication Sig Dispense Refill  . Albuterol Sulfate (PROAIR RESPICLICK) 123XX123 (90 Base) MCG/ACT AEPB Inhale 2 puffs into the lungs every 4 (four) hours as needed. 1 each 2  . ALPRAZolam (XANAX) 1 MG tablet Take 1 tablet (1 mg total) by mouth at bedtime as needed for anxiety. 30 tablet 1  . aspirin EC 81 MG tablet Take 1 tablet (81 mg total) by mouth daily. 30 tablet 0  . Cholecalciferol 1000 units capsule Take 1,000 Units by mouth daily.    Marland Kitchen diltiazem (CARDIZEM CD) 120 MG 24 hr capsule Take 1 capsule (120 mg total) by mouth daily. 30 capsule 0  . diltiazem (CARDIZEM) 30 MG tablet Take 1 tablet (30 mg total) by mouth 3 (three) times daily as needed. At onset of atrial fibrillation/fast heart rate of > 100 for > 5 minutes 30 tablet 0  . ibuprofen (ADVIL,MOTRIN) 200 MG tablet Take 800 mg by mouth every 6 (six) hours as needed for headache or moderate pain.     No current facility-administered medications for this visit.      Past Medical History:  Diagnosis Date  .  Menorrhagia 2014    ROS:   All systems reviewed and negative except as noted in the HPI.   No past surgical history on file.   Family History  Problem Relation Age of Onset  . Hypertension Mother   . Heart disease Mother     stent placement  . Hyperlipidemia Mother   . Diabetes Maternal Aunt   . Heart disease Maternal Grandmother      Social History   Social History  . Marital status: Married    Spouse name: N/A  . Number of children: N/A  . Years of education: N/A   Occupational History  . Not on file.   Social History Main Topics  . Smoking status: Former Smoker    Years: 20.00    Types: Cigarettes  . Smokeless tobacco: Never Used     Comment: QUIT 2015  . Alcohol use 8.4 oz/week    14 Standard drinks or equivalent per week  . Drug use: No  . Sexual activity: Yes    Partners: Male    Birth control/ protection: Other-see comments     Comment: vasectomy, INTERCOURSE AGE 17SEXUAL PARTNERS LESS THAN 5   Other Topics Concern  . Not on file   Social History Narrative  . No narrative on file     BP 118/84   Pulse (!) 59   Ht  5' 6.5" (1.689 m)   Wt 151 lb 9.6 oz (68.8 kg)   BMI 24.10 kg/m   Physical Exam:  Well appearing NAD HEENT: Unremarkable Neck:  No JVD, no thyromegally Lymphatics:  No adenopathy Back:  No CVA tenderness Lungs:  Clear with no wheezes HEART:  Regular rate rhythm, no murmurs, no rubs, no clicks Abd:  soft, positive bowel sounds, no organomegally, no rebound, no guarding Ext:  2 plus pulses, no edema, no cyanosis, no clubbing Skin:  No rashes no nodules Neuro:  CN II through XII intact, motor grossly intact  EKG - nsr with no preexitaion. She does have a competing low atrial/CS PM   Assess/Plan: 1. SVT - we have no documented tracings. She will undergo watchful waiting. 2. Atrial fib - I suspect this occurred after adenosine. She has had no additional symptoms. Rec: in light of all of above, I have recommended she continue  her cardizem. If she has recurrent SVT, then catheter ablation would be a consideration. If atrial fib, then medical therapy. She will undergo watchful waiting.  Mikle Bosworth.D.

## 2016-04-30 ENCOUNTER — Ambulatory Visit (HOSPITAL_COMMUNITY): Payer: BC Managed Care – PPO | Admitting: Nurse Practitioner

## 2016-05-23 ENCOUNTER — Other Ambulatory Visit: Payer: Self-pay

## 2016-05-23 ENCOUNTER — Other Ambulatory Visit: Payer: Self-pay | Admitting: *Deleted

## 2016-05-23 MED ORDER — ALPRAZOLAM 1 MG PO TABS: 1.0000 mg | ORAL_TABLET | Freq: Every evening | ORAL | 1 refills | Status: DC | PRN

## 2016-05-23 MED ORDER — DILTIAZEM HCL ER COATED BEADS 120 MG PO CP24: 120.0000 mg | ORAL_CAPSULE | Freq: Every day | ORAL | 11 refills | Status: DC

## 2016-05-23 MED ORDER — ASPIRIN EC 81 MG PO TBEC: 81.0000 mg | DELAYED_RELEASE_TABLET | Freq: Every day | ORAL | 11 refills | Status: DC

## 2016-05-23 NOTE — Telephone Encounter (Signed)
Ok to refill these for the patient? They were prescribed by Lindell Noe, MD.

## 2016-05-23 NOTE — Telephone Encounter (Signed)
Ok for refill? 

## 2016-05-23 NOTE — Telephone Encounter (Signed)
Okay to fill? 

## 2016-05-23 NOTE — Telephone Encounter (Signed)
Called into pharmacy

## 2016-09-17 ENCOUNTER — Other Ambulatory Visit: Payer: Self-pay | Admitting: Family Medicine

## 2016-10-12 ENCOUNTER — Ambulatory Visit (INDEPENDENT_AMBULATORY_CARE_PROVIDER_SITE_OTHER): Payer: BC Managed Care – PPO | Admitting: Women's Health

## 2016-10-12 ENCOUNTER — Encounter: Payer: Self-pay | Admitting: Women's Health

## 2016-10-12 VITALS — BP 114/80 | Ht 66.0 in | Wt 146.8 lb

## 2016-10-12 DIAGNOSIS — M545 Low back pain, unspecified: Secondary | ICD-10-CM

## 2016-10-12 DIAGNOSIS — G4709 Other insomnia: Secondary | ICD-10-CM | POA: Diagnosis not present

## 2016-10-12 DIAGNOSIS — G8929 Other chronic pain: Secondary | ICD-10-CM | POA: Diagnosis not present

## 2016-10-12 DIAGNOSIS — Z13220 Encounter for screening for lipoid disorders: Secondary | ICD-10-CM

## 2016-10-12 DIAGNOSIS — E559 Vitamin D deficiency, unspecified: Secondary | ICD-10-CM

## 2016-10-12 DIAGNOSIS — R7309 Other abnormal glucose: Secondary | ICD-10-CM

## 2016-10-12 DIAGNOSIS — Z01419 Encounter for gynecological examination (general) (routine) without abnormal findings: Secondary | ICD-10-CM

## 2016-10-12 DIAGNOSIS — Z13820 Encounter for screening for osteoporosis: Secondary | ICD-10-CM

## 2016-10-12 MED ORDER — ALPRAZOLAM 1 MG PO TABS: 1.0000 mg | ORAL_TABLET | Freq: Every evening | ORAL | 1 refills | Status: DC | PRN

## 2016-10-12 MED ORDER — MELOXICAM 7.5 MG PO TABS: 7.5000 mg | ORAL_TABLET | Freq: Every day | ORAL | 1 refills | Status: DC

## 2016-10-12 NOTE — Patient Instructions (Addendum)
Health Maintenance for Postmenopausal Women Menopause is a normal process in which your reproductive ability comes to an end. This process happens gradually over a span of months to years, usually between the ages of 33 and 38. Menopause is complete when you have missed 12 consecutive menstrual periods. It is important to talk with your health care provider about some of the most common conditions that affect postmenopausal women, such as heart disease, cancer, and bone loss (osteoporosis). Adopting a healthy lifestyle and getting preventive care can help to promote your health and wellness. Those actions can also lower your chances of developing some of these common conditions. What should I know about menopause? During menopause, you may experience a number of symptoms, such as:  Moderate-to-severe hot flashes.  Night sweats.  Decrease in sex drive.  Mood swings.  Headaches.  Tiredness.  Irritability.  Memory problems.  Insomnia. Choosing to treat or not to treat menopausal changes is an individual decision that you make with your health care provider. What should I know about hormone replacement therapy and supplements? Hormone therapy products are effective for treating symptoms that are associated with menopause, such as hot flashes and night sweats. Hormone replacement carries certain risks, especially as you become older. If you are thinking about using estrogen or estrogen with progestin treatments, discuss the benefits and risks with your health care provider. What should I know about heart disease and stroke? Heart disease, heart attack, and stroke become more likely as you age. This may be due, in part, to the hormonal changes that your body experiences during menopause. These can affect how your body processes dietary fats, triglycerides, and cholesterol. Heart attack and stroke are both medical emergencies. There are many things that you can do to help prevent heart disease  and stroke:  Have your blood pressure checked at least every 1-2 years. High blood pressure causes heart disease and increases the risk of stroke.  If you are 48-61 years old, ask your health care provider if you should take aspirin to prevent a heart attack or a stroke.  Do not use any tobacco products, including cigarettes, chewing tobacco, or electronic cigarettes. If you need help quitting, ask your health care provider.  It is important to eat a healthy diet and maintain a healthy weight.  Be sure to include plenty of vegetables, fruits, low-fat dairy products, and lean protein.  Avoid eating foods that are high in solid fats, added sugars, or salt (sodium).  Get regular exercise. This is one of the most important things that you can do for your health.  Try to exercise for at least 150 minutes each week. The type of exercise that you do should increase your heart rate and make you sweat. This is known as moderate-intensity exercise.  Try to do strengthening exercises at least twice each week. Do these in addition to the moderate-intensity exercise.  Know your numbers.Ask your health care provider to check your cholesterol and your blood glucose. Continue to have your blood tested as directed by your health care provider. What should I know about cancer screening? There are several types of cancer. Take the following steps to reduce your risk and to catch any cancer development as early as possible. Breast Cancer  Practice breast self-awareness.  This means understanding how your breasts normally appear and feel.  It also means doing regular breast self-exams. Let your health care provider know about any changes, no matter how small.  If you are 40 or older,  have a clinician do a breast exam (clinical breast exam or CBE) every year. Depending on your age, family history, and medical history, it may be recommended that you also have a yearly breast X-ray (mammogram).  If you  have a family history of breast cancer, talk with your health care provider about genetic screening.  If you are at high risk for breast cancer, talk with your health care provider about having an MRI and a mammogram every year.  Breast cancer (BRCA) gene test is recommended for women who have family members with BRCA-related cancers. Results of the assessment will determine the need for genetic counseling and BRCA1 and for BRCA2 testing. BRCA-related cancers include these types:  Breast. This occurs in males or females.  Ovarian.  Tubal. This may also be called fallopian tube cancer.  Cancer of the abdominal or pelvic lining (peritoneal cancer).  Prostate.  Pancreatic. Cervical, Uterine, and Ovarian Cancer  Your health care provider may recommend that you be screened regularly for cancer of the pelvic organs. These include your ovaries, uterus, and vagina. This screening involves a pelvic exam, which includes checking for microscopic changes to the surface of your cervix (Pap test).  For women ages 21-65, health care providers may recommend a pelvic exam and a Pap test every three years. For women ages 23-65, they may recommend the Pap test and pelvic exam, combined with testing for human papilloma virus (HPV), every five years. Some types of HPV increase your risk of cervical cancer. Testing for HPV may also be done on women of any age who have unclear Pap test results.  Other health care providers may not recommend any screening for nonpregnant women who are considered low risk for pelvic cancer and have no symptoms. Ask your health care provider if a screening pelvic exam is right for you.  If you have had past treatment for cervical cancer or a condition that could lead to cancer, you need Pap tests and screening for cancer for at least 20 years after your treatment. If Pap tests have been discontinued for you, your risk factors (such as having a new sexual partner) need to be reassessed  to determine if you should start having screenings again. Some women have medical problems that increase the chance of getting cervical cancer. In these cases, your health care provider may recommend that you have screening and Pap tests more often.  If you have a family history of uterine cancer or ovarian cancer, talk with your health care provider about genetic screening.  If you have vaginal bleeding after reaching menopause, tell your health care provider.  There are currently no reliable tests available to screen for ovarian cancer. Lung Cancer  Lung cancer screening is recommended for adults 99-83 years old who are at high risk for lung cancer because of a history of smoking. A yearly low-dose CT scan of the lungs is recommended if you:  Currently smoke.  Have a history of at least 30 pack-years of smoking and you currently smoke or have quit within the past 15 years. A pack-year is smoking an average of one pack of cigarettes per day for one year. Yearly screening should:  Continue until it has been 15 years since you quit.  Stop if you develop a health problem that would prevent you from having lung cancer treatment. Colorectal Cancer  This type of cancer can be detected and can often be prevented.  Routine colorectal cancer screening usually begins at age 72 and continues  through age 75.  If you have risk factors for colon cancer, your health care provider may recommend that you be screened at an earlier age.  If you have a family history of colorectal cancer, talk with your health care provider about genetic screening.  Your health care provider may also recommend using home test kits to check for hidden blood in your stool.  A small camera at the end of a tube can be used to examine your colon directly (sigmoidoscopy or colonoscopy). This is done to check for the earliest forms of colorectal cancer.  Direct examination of the colon should be repeated every 5-10 years until  age 75. However, if early forms of precancerous polyps or small growths are found or if you have a family history or genetic risk for colorectal cancer, you may need to be screened more often. Skin Cancer  Check your skin from head to toe regularly.  Monitor any moles. Be sure to tell your health care provider:  About any new moles or changes in moles, especially if there is a change in a mole's shape or color.  If you have a mole that is larger than the size of a pencil eraser.  If any of your family members has a history of skin cancer, especially at a young age, talk with your health care provider about genetic screening.  Always use sunscreen. Apply sunscreen liberally and repeatedly throughout the day.  Whenever you are outside, protect yourself by wearing long sleeves, pants, a wide-brimmed hat, and sunglasses. What should I know about osteoporosis? Osteoporosis is a condition in which bone destruction happens more quickly than new bone creation. After menopause, you may be at an increased risk for osteoporosis. To help prevent osteoporosis or the bone fractures that can happen because of osteoporosis, the following is recommended:  If you are 19-50 years old, get at least 1,000 mg of calcium and at least 600 mg of vitamin D per day.  If you are older than age 50 but younger than age 70, get at least 1,200 mg of calcium and at least 600 mg of vitamin D per day.  If you are older than age 70, get at least 1,200 mg of calcium and at least 800 mg of vitamin D per day. Smoking and excessive alcohol intake increase the risk of osteoporosis. Eat foods that are rich in calcium and vitamin D, and do weight-bearing exercises several times each week as directed by your health care provider. What should I know about how menopause affects my mental health? Depression may occur at any age, but it is more common as you become older. Common symptoms of depression include:  Low or sad  mood.  Changes in sleep patterns.  Changes in appetite or eating patterns.  Feeling an overall lack of motivation or enjoyment of activities that you previously enjoyed.  Frequent crying spells. Talk with your health care provider if you think that you are experiencing depression. What should I know about immunizations? It is important that you get and maintain your immunizations. These include:  Tetanus, diphtheria, and pertussis (Tdap) booster vaccine.  Influenza every year before the flu season begins.  Pneumonia vaccine.  Shingles vaccine. Your health care provider may also recommend other immunizations. This information is not intended to replace advice given to you by your health care provider. Make sure you discuss any questions you have with your health care provider. Document Released: 09/21/2005 Document Revised: 02/17/2016 Document Reviewed: 05/03/2015 Elsevier Interactive Patient   Education  2017 [employer] Venlafaxine extended-release capsules What is this medicine? VENLAFAXINE(VEN la fax een) is used to treat depression, anxiety and panic disorder. This medicine may be used for other purposes; ask your health care provider or pharmacist if you have questions. COMMON BRAND NAME(S): Effexor XR What should I tell my health care provider before I take this medicine? They need to know if you have any of these conditions: -bleeding disorders -glaucoma -heart disease -high blood pressure -high cholesterol -kidney disease -liver disease -low levels of sodium in the blood -mania or bipolar disorder -seizures -suicidal thoughts, plans, or attempt; a previous suicide attempt by you or a family -take medicines that treat or prevent blood clots -thyroid disease -an unusual or allergic reaction to venlafaxine, desvenlafaxine, other medicines, foods, dyes, or preservatives -pregnant or trying to get pregnant -breast-feeding How should I use this medicine? Take this  medicine by mouth with a full glass of water. Follow the directions on the prescription label. Do not cut, crush, or chew this medicine. Take it with food. If needed, the capsule may be carefully opened and the entire contents sprinkled on a spoonful of cool applesauce. Swallow the applesauce/pellet mixture right away without chewing and follow with a glass of water to ensure complete swallowing of the pellets. Try to take your medicine at about the same time each day. Do not take your medicine more often than directed. Do not stop taking this medicine suddenly except upon the advice of your doctor. Stopping this medicine too quickly may cause serious side effects or your condition may worsen. A special MedGuide will be given to you by the pharmacist with each prescription and refill. Be sure to read this information carefully each time. Talk to your pediatrician regarding the use of this medicine in children. Special care may be needed. Overdosage: If you think you have taken too much of this medicine contact a poison control center or emergency room at once. NOTE: This medicine is only for you. Do not share this medicine with others. What if I miss a dose? If you miss a dose, take it as soon as you can. If it is almost time for your next dose, take only that dose. Do not take double or extra doses. What may interact with this medicine? Do not take this medicine with any of the following medications: -certain medicines for fungal infections like fluconazole, itraconazole, ketoconazole, posaconazole, voriconazole -cisapride -desvenlafaxine -dofetilide -dronedarone -duloxetine -levomilnacipran -linezolid -MAOIs like Carbex, Eldepryl, Marplan, Nardil, and Parnate -methylene blue (injected into a vein) -milnacipran -pimozide -thioridazine -ziprasidone This medicine may also interact with the following medications: -amphetamines -aspirin and aspirin-like medicines -certain medicines for  depression, anxiety, or psychotic disturbances -certain medicines for migraine headaches like almotriptan, eletriptan, frovatriptan, naratriptan, rizatriptan, sumatriptan, zolmitriptan -certain medicines for sleep -certain medicines that treat or prevent blood clots like dalteparin, enoxaparin, warfarin -cimetidine -clozapine -diuretics -fentanyl -furazolidone -indinavir -isoniazid -lithium -metoprolol -NSAIDS, medicines for pain and inflammation, like ibuprofen or naproxen -other medicines that prolong the QT interval (cause an abnormal heart rhythm) -procarbazine -rasagiline -supplements like St. John's wort, kava kava, valerian -tramadol -tryptophan This list may not describe all possible interactions. Give your health care provider a list of all the medicines, herbs, non-prescription drugs, or dietary supplements you use. Also tell them if you smoke, drink alcohol, or use illegal drugs. Some items may interact with your medicine. What should I watch for while using this medicine? Tell your doctor if your symptoms do not get  better or if they get worse. Visit your doctor or health care professional for regular checks on your progress. Because it may take several weeks to see the full effects of this medicine, it is important to continue your treatment as prescribed by your doctor. Patients and their families should watch out for new or worsening thoughts of suicide or depression. Also watch out for sudden changes in feelings such as feeling anxious, agitated, panicky, irritable, hostile, aggressive, impulsive, severely restless, overly excited and hyperactive, or not being able to sleep. If this happens, especially at the beginning of treatment or after a change in dose, call your health care professional. This medicine can cause an increase in blood pressure. Check with your doctor for instructions on monitoring your blood pressure while taking this medicine. You may get drowsy or dizzy.  Do not drive, use machinery, or do anything that needs mental alertness until you know how this medicine affects you. Do not stand or sit up quickly, especially if you are an older patient. This reduces the risk of dizzy or fainting spells. Alcohol may interfere with the effect of this medicine. Avoid alcoholic drinks. Your mouth may get dry. Chewing sugarless gum, sucking hard candy and drinking plenty of water will help. Contact your doctor if the problem does not go away or is severe. What side effects may I notice from receiving this medicine? Side effects that you should report to your doctor or health care professional as soon as possible: -allergic reactions like skin rash, itching or hives, swelling of the face, lips, or tongue -anxious -breathing problems -confusion -changes in vision -chest pain -confusion -elevated mood, decreased need for sleep, racing thoughts, impulsive behavior -eye pain -fast, irregular heartbeat -feeling faint or lightheaded, falls -feeling agitated, angry, or irritable -hallucination, loss of contact with reality -high blood pressure -loss of balance or coordination -palpitations -redness, blistering, peeling or loosening of the skin, including inside the mouth -restlessness, pacing, inability to keep still -seizures -stiff muscles -suicidal thoughts or other mood changes -trouble passing urine or change in the amount of urine -trouble sleeping -unusual bleeding or bruising -unusually weak or tired -vomiting Side effects that usually do not require medical attention (report to your doctor or health care professional if they continue or are bothersome): -change in sex drive or performance -change in appetite or weight -constipation -dizziness -dry mouth -headache -increased sweating -nausea -tired This list may not describe all possible side effects. Call your doctor for medical advice about side effects. You may report side effects to FDA at  1-800-FDA-1088. Where should I keep my medicine? Keep out of the reach of children. Store at a controlled temperature between 20 and 25 degrees C (68 degrees and 77 degrees F), in a dry place. Throw away any unused medicine after the expiration date. NOTE: This sheet is a summary. It may not cover all possible information. If you have questions about this medicine, talk to your doctor, pharmacist, or health care provider.  2018 Elsevier/Gold Standard (2015-12-29 18:38:02)

## 2016-10-12 NOTE — Progress Notes (Signed)
Alexandra Grant 1961-01-31 [id number]    History:    Presents for annual exam.  Postmenopausal with no bleeding on no HRT with numerous hot flushes. 04/2016 diagnosed with A. fib is on Cardizem doing well since. Normal Pap and mammogram history. 07/2014 negative colonoscopy. Was a light smoker prior to September has quit. Requested a prescription for meloxicam for occasional low back/ hip type discomfort from sitting all day. Uses Xanax often for sleep.  Past medical history, past surgical history, family history and social history were all reviewed and documented in the EPIC chart. Works at [employer] job. Mother and grandmother both with heart disease. 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  ROS:  A ROS was performed and pertinent positives and negatives are included.  Exam:  Vitals:   10/12/16 1412  BP: 114/80  Weight: 146 lb 12.8 oz (66.6 kg)  Height: 5\' 6"  (1.676 m)   Body mass index is 23.69 kg/m.   General appearance:  Normal Thyroid:  Symmetrical, normal in size, without palpable masses or nodularity. Respiratory  Auscultation:  Clear without wheezing or rhonchi Cardiovascular  Auscultation:  Regular rate, without rubs, murmurs or gallops  Edema/varicosities:  Not grossly evident Abdominal  Soft,nontender, without masses, guarding or rebound.  Liver/spleen:  No organomegaly noted  Hernia:  None appreciated  Skin  Inspection:  Grossly normal   Breasts: Examined lying and sitting.     Right: Without masses, retractions, discharge or axillary adenopathy.     Left: Without masses, retractions, discharge or axillary adenopathy. Gentitourinary   Inguinal/mons:  Normal without inguinal adenopathy  External genitalia:  Normal  BUS/Urethra/Skene's glands:  Normal  Vagina:  Normal  Cervix:  Normal  Uterus:  normal in size, shape and contour.  Midline and mobile  Adnexa/parametria:     Rt: Without masses or  tenderness.   Lt: Without masses or tenderness.  Anus and perineum: Normal  Digital rectal exam: Normal sphincter tone without palpated masses or tenderness  Assessment/Plan:  56 y.o. MWF G2 P2 for annual exam.    Postmenopausal on no HRT with no bleeding having numerous hot flashes 04/2016 A. fib on cardiazem doing well- cardiologist managing Hip/low back pain-rare meloxicam Insomnia-Xanax  Plan: Is not fasting will return to office for fasting labs CBC, CMP, hemoglobin A1c, lipid panel, vitamin D. We'll schedule DEXA. Home safety, fall prevention and importance of weightbearing exercise reviewed. Congratulated on smoking cessation. Instructed to call cardiologist prior to taking meloxicam, she has taken several doses from a friend that have helped with low-back/hip pain and had requested a prescription. Instructed to also schedule appointment for follow-up with the cardiologist at [facility] cardiology for A. fib monitoring. SBE's, continue annual screening mammogram. Vitamin D 2000 daily encouraged. Xanax 1 mg half tablet when necessary at bedtime for sleep, prescription, proper use given and reviewed addictive properties to use sparingly. Pap normal 2017 with negative HR HPV screening guidelines reviewed.    Alexandra Grant [facility], 2:47 PM 10/12/2016

## 2016-11-16 ENCOUNTER — Encounter: Payer: Self-pay | Admitting: Women's Health

## 2016-11-20 ENCOUNTER — Encounter: Payer: Self-pay | Admitting: Women's Health

## 2016-12-10 ENCOUNTER — Ambulatory Visit (INDEPENDENT_AMBULATORY_CARE_PROVIDER_SITE_OTHER): Payer: BC Managed Care – PPO

## 2016-12-10 DIAGNOSIS — Z13820 Encounter for screening for osteoporosis: Secondary | ICD-10-CM | POA: Diagnosis not present

## 2016-12-10 DIAGNOSIS — M810 Age-related osteoporosis without current pathological fracture: Secondary | ICD-10-CM

## 2016-12-11 ENCOUNTER — Other Ambulatory Visit: Payer: Self-pay | Admitting: Women's Health

## 2016-12-11 ENCOUNTER — Encounter: Payer: Self-pay | Admitting: Gynecology

## 2016-12-11 DIAGNOSIS — Z13820 Encounter for screening for osteoporosis: Secondary | ICD-10-CM

## 2016-12-11 DIAGNOSIS — M810 Age-related osteoporosis without current pathological fracture: Secondary | ICD-10-CM

## 2016-12-13 ENCOUNTER — Other Ambulatory Visit: Payer: Self-pay | Admitting: *Deleted

## 2016-12-13 DIAGNOSIS — M818 Other osteoporosis without current pathological fracture: Secondary | ICD-10-CM

## 2016-12-14 ENCOUNTER — Encounter: Payer: Self-pay | Admitting: Women's Health

## 2016-12-14 ENCOUNTER — Other Ambulatory Visit: Payer: BC Managed Care – PPO

## 2016-12-14 DIAGNOSIS — M818 Other osteoporosis without current pathological fracture: Secondary | ICD-10-CM

## 2016-12-17 LAB — PTH, INTACT AND CALCIUM
Calcium: 8.9 mg/dL (ref 8.6–10.4)
PTH: 44 pg/mL (ref 14–64)

## 2016-12-24 ENCOUNTER — Ambulatory Visit (INDEPENDENT_AMBULATORY_CARE_PROVIDER_SITE_OTHER): Payer: BC Managed Care – PPO | Admitting: Gynecology

## 2016-12-24 ENCOUNTER — Encounter: Payer: Self-pay | Admitting: Gynecology

## 2016-12-24 DIAGNOSIS — M810 Age-related osteoporosis without current pathological fracture: Secondary | ICD-10-CM

## 2016-12-24 NOTE — Patient Instructions (Addendum)
Denosumab injection What is this medicine? DENOSUMAB (den oh sue mab) slows bone breakdown. Prolia is used to treat osteoporosis in women after menopause and in men. Delton See is used to treat a high calcium level due to cancer and to prevent bone fractures and other bone problems caused by multiple myeloma or cancer bone metastases. Delton See is also used to treat giant cell tumor of the bone. This medicine may be used for other purposes; ask your health care provider or pharmacist if you have questions. COMMON BRAND NAME(S): Prolia, XGEVA What should I tell my health care provider before I take this medicine? They need to know if you have any of these conditions: -dental disease -having surgery or tooth extraction -infection -kidney disease -low levels of calcium or Vitamin D in the blood -malnutrition -on hemodialysis -skin conditions or sensitivity -thyroid or parathyroid disease -an unusual reaction to denosumab, other medicines, foods, dyes, or preservatives -pregnant or trying to get pregnant -breast-feeding How should I use this medicine? This medicine is for injection under the skin. It is given by a health care professional in a hospital or clinic setting. If you are getting Prolia, a special MedGuide will be given to you by the pharmacist with each prescription and refill. Be sure to read this information carefully each time. For Prolia, talk to your pediatrician regarding the use of this medicine in children. Special care may be needed. For Delton See, talk to your pediatrician regarding the use of this medicine in children. While this drug may be prescribed for children as young as 13 years for selected conditions, precautions do apply. Overdosage: If you think you have taken too much of this medicine contact a poison control center or emergency room at once. NOTE: This medicine is only for you. Do not share this medicine with others. What if I miss a dose? It is important not to miss your  dose. Call your doctor or health care professional if you are unable to keep an appointment. What may interact with this medicine? Do not take this medicine with any of the following medications: -other medicines containing denosumab This medicine may also interact with the following medications: -medicines that lower your chance of fighting infection -steroid medicines like prednisone or cortisone This list may not describe all possible interactions. Give your health care provider a list of all the medicines, herbs, non-prescription drugs, or dietary supplements you use. Also tell them if you smoke, drink alcohol, or use illegal drugs. Some items may interact with your medicine. What should I watch for while using this medicine? Visit your doctor or health care professional for regular checks on your progress. Your doctor or health care professional may order blood tests and other tests to see how you are doing. Call your doctor or health care professional for advice if you get a fever, chills or sore throat, or other symptoms of a cold or flu. Do not treat yourself. This drug may decrease your body's ability to fight infection. Try to avoid being around people who are sick. You should make sure you get enough calcium and vitamin D while you are taking this medicine, unless your doctor tells you not to. Discuss the foods you eat and the vitamins you take with your health care professional. See your dentist regularly. Brush and floss your teeth as directed. Before you have any dental work done, tell your dentist you are receiving this medicine. Do not become pregnant while taking this medicine or for 5 months after stopping  it. Talk with your doctor or health care professional about your birth control options while taking this medicine. Women should inform their doctor if they wish to become pregnant or think they might be pregnant. There is a potential for serious side effects to an unborn child. Talk  to your health care professional or pharmacist for more information. What side effects may I notice from receiving this medicine? Side effects that you should report to your doctor or health care professional as soon as possible: -allergic reactions like skin rash, itching or hives, swelling of the face, lips, or tongue -bone pain -breathing problems -dizziness -jaw pain, especially after dental work -redness, blistering, peeling of the skin -signs and symptoms of infection like fever or chills; cough; sore throat; pain or trouble passing urine -signs of low calcium like fast heartbeat, muscle cramps or muscle pain; pain, tingling, numbness in the hands or feet; seizures -unusual bleeding or bruising -unusually weak or tired Side effects that usually do not require medical attention (report to your doctor or health care professional if they continue or are bothersome): -constipation -diarrhea -headache -joint pain -loss of appetite -muscle pain -runny nose -tiredness -upset stomach This list may not describe all possible side effects. Call your doctor for medical advice about side effects. You may report side effects to FDA at 1-800-FDA-1088. Where should I keep my medicine? This medicine is only given in a clinic, doctor's office, or other health care setting and will not be stored at home. NOTE: This sheet is a summary. It may not cover all possible information. If you have questions about this medicine, talk to your doctor, pharmacist, or health care provider.  2018 Elsevier/Gold Standard (2016-08-21 19:17:21) Osteoporosis Osteoporosis is the thinning and loss of density in the bones. Osteoporosis makes the bones more brittle, fragile, and likely to break (fracture). Over time, osteoporosis can cause the bones to become so weak that they fracture after a simple fall. The bones most likely to fracture are the bones in the hip, wrist, and spine. What are the causes? The exact cause  is not known. What increases the risk? Anyone can develop osteoporosis. You may be at greater risk if you have a family history of the condition or have poor nutrition. You may also have a higher risk if you are:  Female.  20 years old or older.  A smoker.  Not physically active.  White or Asian.  Slender. What are the signs or symptoms? A fracture might be the first sign of the disease, especially if it results from a fall or injury that would not usually cause a bone to break. Other signs and symptoms include:  Low back and neck pain.  Stooped posture.  Height loss. How is this diagnosed? To make a diagnosis, your health care provider may:  Take a medical history.  Perform a physical exam.  Order tests, such as:  A bone mineral density test.  A dual-energy X-ray absorptiometry test. How is this treated? The goal of osteoporosis treatment is to strengthen your bones to reduce your risk of a fracture. Treatment may involve:  Making lifestyle changes, such as:  Eating a diet rich in calcium.  Doing weight-bearing and muscle-strengthening exercises.  Stopping tobacco use.  Limiting alcohol intake.  Taking medicine to slow the process of bone loss or to increase bone density.  Monitoring your levels of calcium and vitamin D. Follow these instructions at home:  Include calcium and vitamin D in your diet. Calcium is  important for bone health, and vitamin D helps the body absorb calcium.  Perform weight-bearing and muscle-strengthening exercises as directed by your health care provider.  Do not use any tobacco products, including cigarettes, chewing tobacco, and electronic cigarettes. If you need help quitting, ask your health care provider.  Limit your alcohol intake.  Take medicines only as directed by your health care provider.  Keep all follow-up visits as directed by your health care provider. This is important.  Take precautions at home to lower your  risk of falling, such as:  Keeping rooms well lit and clutter free.  Installing safety rails on stairs.  Using rubber mats in the bathroom and other areas that are often wet or slippery. Get help right away if: You fall or injure yourself. This information is not intended to replace advice given to you by your health care provider. Make sure you discuss any questions you have with your health care provider. Document Released: 05/09/2005 Document Revised: 01/02/2016 Document Reviewed: 01/07/2014 Elsevier Interactive Patient Education  2017 Elsevier Inc.  Vitamin D Deficiency Vitamin D deficiency is when your body does not have enough vitamin D. Vitamin D is important to your body for many reasons:  It helps the body to absorb two important minerals, called calcium and phosphorus.  It plays a role in bone health.  It may help to prevent some diseases, such as diabetes and multiple sclerosis.  It plays a role in muscle function, including heart function. You can get vitamin D by:  Eating foods that naturally contain vitamin D.  Eating or drinking milk or other dairy products that have vitamin D added to them.  Taking a vitamin D supplement or a multivitamin supplement that contains vitamin D.  Being in the sun. Your body naturally makes vitamin D when your skin is exposed to sunlight. Your body changes the sunlight into a form of the vitamin that the body can use. If vitamin D deficiency is severe, it can cause a condition in which your bones become soft. In adults, this condition is called osteomalacia. In children, this condition is called rickets. What are the causes? Vitamin D deficiency may be caused by:  Not eating enough foods that contain vitamin D.  Not getting enough sun exposure.  Having certain digestive system diseases that make it difficult for your body to absorb vitamin D. These diseases include Crohn disease, chronic pancreatitis, and cystic fibrosis.  Having  a surgery in which a part of the stomach or a part of the small intestine is removed.  Being obese.  Having chronic kidney disease or liver disease. What increases the risk? This condition is more likely to develop in:  Older people.  People who do not spend much time outdoors.  People who live in a long-term care facility.  People who have had broken bones.  People with weak or thin bones (osteoporosis).  People who have a disease or condition that changes how the body absorbs vitamin D.  People who have dark skin.  People who take certain medicines, such as steroid medicines or certain seizure medicines.  People who are overweight or obese. What are the signs or symptoms? In mild cases of vitamin D deficiency, there may not be any symptoms. If the condition is severe, symptoms may include:  Bone pain.  Muscle pain.  Falling often.  Broken bones caused by a minor injury. How is this diagnosed? This condition is usually diagnosed with a blood test. How is this treated?  Treatment for this condition may depend on what caused the condition. Treatment options include:  Taking vitamin D supplements.  Taking a calcium supplement. Your health care provider will suggest what dose is best for you. Follow these instructions at home:  Take medicines and supplements only as told by your health care provider.  Eat foods that contain vitamin D. Choices include:  Fortified dairy products, cereals, or juices. Fortified means that vitamin D has been added to the food. Check the label on the package to be sure.  Fatty fish, such as salmon or trout.  Eggs.  Oysters.  Do not use a tanning bed.  Maintain a healthy weight. Lose weight, if needed.  Keep all follow-up visits as told by your health care provider. This is important. Contact a health care provider if:  Your symptoms do not go away.  You feel like throwing up (nausea) or you throw up (vomit).  You have fewer  bowel movements than usual or it is difficult for you to have a bowel movement (constipation). This information is not intended to replace advice given to you by your health care provider. Make sure you discuss any questions you have with your health care provider. Document Released: 10/22/2011 Document Revised: 01/11/2016 Document Reviewed: 12/15/2014 Elsevier Interactive Patient Education  2017 [employer].

## 2016-12-24 NOTE — Progress Notes (Signed)
Patient ID: Alexandra Grant, female   DOB: [birth year], 56 y.o.   MRN: [id number]     Patient is a 56 year old who presented to the office today to discuss her recent bone density study. Her bone density study indicating osteoporosis and before her office visit today she was instructed to take calcium vitamin D and PTH if for some reason only her calcium and PTH was run which was normal and not her vitamin D level. Patient has had past history the past vitamin D deficiency. She's currently taking 1200 mg of calcium in 2000 units of vitamin D. Patient is a nonsmoker on no hormone replacement therapy no past history of fractures no past history of been on glucocorticoids no family history of osteoporosis and she does not drink alcohol.  Her bone density study on 12/11/2016 demonstrated the lowest T score was at the right femoral neck with a value of -2.5 indicative of osteoporosis. This indicates that her bone mass is 25% below normal and that she has an 11 times greater risk of a hip fracture in comparison to someone her age. Patient with no previous bone density study for comparison.  Patient stated that many years ago when she was much younger she fracture her risk in the been placed on Fosamax for short time but she cannot tolerate the medication. Based on these findings different treatment regimens with potential side effects were discussed as follows:  The risk and benefits of IV bisphosphonate were discussed with the patient today to include a significant risk reduction for vertebral, hip and non-vertebral fractures. Potential risk of therapy were also discussed to include a 10-15% chance for a flulike reaction which may last 2-3 days after IV bisphosphonate. We also discussed that longer reactions lasting perhaps weeks to months have been rarely reported to the FDA following IV bisphosphonate treatment. This would also require symptomatic management. Other potential risk that were discussed included the  following: Regular eye irritation, approximately 1 and 1000-10,000 risk for osteonecrosis of the jaw as well as a risk for atypical femoral fracture of approximately 1 in 5000-10,000 based on current data on oral bisphosphonate. Signs and symptoms of atypical femoral fracture were also discussed and the patient was asked to contact the office if she develops any of these symptoms.  The risk and benefits of Forteo were discussed with the patient today to include a significant risk reduction for vertebral and nontender vertebral fractures. The most common side effects that have been reported include: Dizziness, and leg cramps although generally not severe enough to warrant discontinuation in the majority of patient on this medication. The black box warning was discussed in depth, which is based on the fact that supra-pharmacological doses of Forteo did increase the risk for benign and malignant bone tumors in rats, though there has not been an observed increase incidence in humans to date, based on over a decade of clinical experience. Patient does not have any relative contraindication to Forteo, including no history of previous therapeutic radiation therapy, active neoplasms involving the skeleton or Paget's disease.  The risk and benefits of Prolia were discussed with the patient today to include a significant risk reduction for vertebral, hip and non-vertebral fractures. Potential risk also discussed were skin advanced to include rash, pruritus, eczema and other skin reactions. The recurrence of infections and clinical studies with PROLIA, including serious infections were also discussed with the patient. In 3 year clinical trial with Prolia although six-year trial data does not show any evidence for  any additional or accelerating risk of infection. Other risks that were discussed included a 1 in 1000-10,000 risk of osteonecrosis of the jaw based on current available data  The risk and benefits of Evista  were discussed with the patient today, including a significant risk reduction for vertebral fractures. Other potential risks discussed with the patient include the most common reported adverse effect which are flushes and leg cramps. We also discussed the risk for DVT (deep venous thrombosis) of approximately 1 in 400, similar to that seen in patients taking oral estrogens. The fact that there also may be an increased risk for fatal stroke in individuals with a history of cardiovascular disease and or risk factors of heart attack and stroke based on the RUTH trial was also discussed with the patient.  Assessment/plan: 56 year old menopausal patient on no hormone replacement therapy with past history of wrist fracture several years ago had been placed on oral bisphosphonate which she could not tolerate. Based on her recent bone density study indicating she is osteoporotic with a T score at the right femoral neck -2.5 that she would be a good candidate for monoclonal antibody such as  Prolia  60 mg IM every 6 months. Her vitamin D level will be checked this week. The risk benefits and pros and cons of this medication were discussed as described above she fully understands and accepts. We'll check her insurance coverage and begin treatment. I recommend that after initiating therapy she should have a bone density study in one year to monitor response to therapy and then every 2 years. Have also recommended she be on this medication for 5-7 years before going on a drug holiday. We also went over calcium and vitamin D. She should be taking only 600 mg of calcium daily and 2000 units of vitamin D daily as well as weightbearing exercises 2-3 times a week.  Greater than 90% time was spent counseling [name] care for this patient. Time of consultation 25 minutes

## 2016-12-25 ENCOUNTER — Other Ambulatory Visit: Payer: BC Managed Care – PPO

## 2016-12-25 ENCOUNTER — Telehealth: Payer: Self-pay | Admitting: Gynecology

## 2016-12-25 DIAGNOSIS — Z13220 Encounter for screening for lipoid disorders: Secondary | ICD-10-CM

## 2016-12-25 DIAGNOSIS — Z01419 Encounter for gynecological examination (general) (routine) without abnormal findings: Secondary | ICD-10-CM

## 2016-12-25 DIAGNOSIS — R7309 Other abnormal glucose: Secondary | ICD-10-CM

## 2016-12-25 DIAGNOSIS — E559 Vitamin D deficiency, unspecified: Secondary | ICD-10-CM

## 2016-12-25 LAB — LIPID PANEL
Cholesterol: 169 mg/dL (ref <200)
HDL: 70 mg/dL (ref >50)
LDL Cholesterol: 84 mg/dL (ref <100)
Total CHOL/HDL Ratio: 2.4 Ratio (ref <5.0)
Triglycerides: 74 mg/dL (ref <150)
VLDL: 15 mg/dL (ref <30)

## 2016-12-25 LAB — CBC WITH DIFFERENTIAL/PLATELET
Basophils Absolute: 44 cells/uL (ref 0–200)
Basophils Relative: 1 %
Eosinophils Absolute: 220 cells/uL (ref 15–500)
Eosinophils Relative: 5 %
HCT: 43.1 % (ref 35.0–45.0)
Hemoglobin: 14.4 g/dL (ref 11.7–15.5)
Lymphocytes Relative: 35 %
Lymphs Abs: 1540 cells/uL (ref 850–3900)
MCH: 30.8 pg (ref 27.0–33.0)
MCHC: 33.4 g/dL (ref 32.0–36.0)
MCV: 92.1 fL (ref 80.0–100.0)
MPV: 9.3 fL (ref 7.5–12.5)
Monocytes Absolute: 396 cells/uL (ref 200–950)
Monocytes Relative: 9 %
Neutro Abs: 2200 cells/uL (ref 1500–7800)
Neutrophils Relative %: 50 %
Platelets: 254 10*3/uL (ref 140–400)
RBC: 4.68 MIL/uL (ref 3.80–5.10)
RDW: 12.8 % (ref 11.0–15.0)
WBC: 4.4 10*3/uL (ref 3.8–10.8)

## 2016-12-25 LAB — COMPREHENSIVE METABOLIC PANEL
ALT: 16 U/L (ref 6–29)
AST: 15 U/L (ref 10–35)
Albumin: 4.4 g/dL (ref 3.6–5.1)
Alkaline Phosphatase: 66 U/L (ref 33–130)
BUN: 12 mg/dL (ref 7–25)
CO2: 28 mmol/L (ref 20–31)
Calcium: 9.5 mg/dL (ref 8.6–10.4)
Chloride: 105 mmol/L (ref 98–110)
Creat: 0.86 mg/dL (ref 0.50–1.05)
Glucose, Bld: 87 mg/dL (ref 65–99)
Potassium: 4 mmol/L (ref 3.5–5.3)
Sodium: 141 mmol/L (ref 135–146)
Total Bilirubin: 0.8 mg/dL (ref 0.2–1.2)
Total Protein: 7.1 g/dL (ref 6.1–8.1)

## 2016-12-25 NOTE — Telephone Encounter (Signed)
Alexandra Grant, this patient needs to be started on Prolia as a result of her osteoporosis. Several years ago as a result of her wrist fracture she had been placed on Fosamax and she could not tolerate it. Her calcium level and PTH were normal recently and her vitamin D level is pending. Note from Dr Toney Rakes

## 2016-12-26 ENCOUNTER — Other Ambulatory Visit: Payer: Self-pay | Admitting: Women's Health

## 2016-12-26 ENCOUNTER — Encounter: Payer: Self-pay | Admitting: Gynecology

## 2016-12-26 LAB — VITAMIN D 25 HYDROXY (VIT D DEFICIENCY, FRACTURES): Vit D, 25-Hydroxy: 25 ng/mL — ABNORMAL LOW (ref 30–100)

## 2016-12-26 LAB — HEMOGLOBIN A1C
Hgb A1c MFr Bld: 5.4 % (ref <5.7)
Mean Plasma Glucose: 108 mg/dL

## 2016-12-26 MED ORDER — VITAMIN D (ERGOCALCIFEROL) 1.25 MG (50000 UNIT) PO CAPS: 50000.0000 [IU] | ORAL_CAPSULE | ORAL | 0 refills | Status: DC

## 2017-01-01 ENCOUNTER — Other Ambulatory Visit: Payer: Self-pay | Admitting: Women's Health

## 2017-01-01 DIAGNOSIS — G4709 Other insomnia: Secondary | ICD-10-CM

## 2017-01-01 NOTE — Telephone Encounter (Signed)
Called into pharmacy

## 2017-01-01 NOTE — Telephone Encounter (Signed)
Ok for refill? 

## 2017-01-08 NOTE — Telephone Encounter (Signed)
Pt to pt waiting on insurance benefits.She will call and get Prolia benefit card.

## 2017-01-08 NOTE — Telephone Encounter (Signed)
[facility] 10/2016  . Calcium level 9.5 12/25/16 checking insurance benefits.

## 2017-01-15 NOTE — Telephone Encounter (Addendum)
Insurance benefits , [place] needed, Ref # [id number]. No Deductible, Co pay $85, OOPM $4350 ($25 met). No co insurance. Total Pt  $85  Pt is considering not taking the Prolia . She would like to talk to Elon Alas in more detail. I will forward this to Elon Alas.

## 2017-01-16 NOTE — Telephone Encounter (Signed)
-----   Message from Catha Brow sent at 01/16/2017  2:33 PM EDT ----- Marykay Lex friend read your not you typed no meds and last sentence stated try  New meds

## 2017-01-16 NOTE — Telephone Encounter (Signed)
TC reviewed Prolia, declines. Full of side effects, did not do well with Fosamax with muscle and joint pain. Would rather continue with regular exercise, vitamin D supplement and no medication. Again reviewed importance of weightbearing exercise, home safety and fall prevention discussed. Will call if changes Would like to try medication.

## 2017-01-16 NOTE — Telephone Encounter (Signed)
Phone call from earlier today Alexandra Grant does not want to try any medications at this time, is afraid of side effects. Declines injectable, Prolia, reclast or other bisphosphonates. Will call if changes her mind and would like to try medication.

## 2017-05-03 ENCOUNTER — Other Ambulatory Visit: Payer: Self-pay | Admitting: Family Medicine

## 2017-05-21 ENCOUNTER — Other Ambulatory Visit: Payer: Self-pay | Admitting: Internal Medicine

## 2017-06-10 ENCOUNTER — Other Ambulatory Visit: Payer: Self-pay | Admitting: Internal Medicine

## 2017-06-13 ENCOUNTER — Encounter: Payer: Self-pay | Admitting: Family Medicine

## 2017-06-13 ENCOUNTER — Ambulatory Visit (INDEPENDENT_AMBULATORY_CARE_PROVIDER_SITE_OTHER): Payer: BC Managed Care – PPO | Admitting: Family Medicine

## 2017-06-13 ENCOUNTER — Ambulatory Visit (INDEPENDENT_AMBULATORY_CARE_PROVIDER_SITE_OTHER): Payer: BC Managed Care – PPO

## 2017-06-13 VITALS — HR 70 | Temp 98.4°F | Resp 16 | Ht 66.0 in | Wt 151.2 lb

## 2017-06-13 DIAGNOSIS — J4530 Mild persistent asthma, uncomplicated: Secondary | ICD-10-CM

## 2017-06-13 DIAGNOSIS — J4540 Moderate persistent asthma, uncomplicated: Secondary | ICD-10-CM | POA: Insufficient documentation

## 2017-06-13 DIAGNOSIS — Z87891 Personal history of nicotine dependence: Secondary | ICD-10-CM

## 2017-06-13 MED ORDER — FLUTICASONE FUROATE-VILANTEROL 100-25 MCG/INH IN AEPB: 1.0000 | INHALATION_SPRAY | Freq: Every day | RESPIRATORY_TRACT | 11 refills | Status: DC

## 2017-06-13 MED ORDER — LEVALBUTEROL HCL 0.63 MG/3ML IN NEBU: 0.6300 mg | INHALATION_SOLUTION | Freq: Once | RESPIRATORY_TRACT | Status: DC

## 2017-06-13 MED ORDER — ALBUTEROL SULFATE 108 (90 BASE) MCG/ACT IN AEPB: 2.0000 | INHALATION_SPRAY | RESPIRATORY_TRACT | 11 refills | Status: DC | PRN

## 2017-06-13 NOTE — Progress Notes (Signed)
Subjective:    Patient ID: Alexandra Grant, female    DOB: January 27, 1961, 56 y.o.   MRN: [id number] Chief Complaint  Patient presents with  . Medication Refill    ProAir Respiclick     HPI She has noticed that at night her sxs worsen.  She is using her albuterol twice a day. However, she doesn't like to take medicines but wants to be proactive with her breathing.  As long as she takes her inhaler before bed, she doesn't wake up overnight with sxs. She has a heart condition. Her sxs started worsening in the past sev years. Her father also had adult asthma that was severe.  She stopped smoking several years ago.  Both her parents smoked so lots of second hand smoke exposure growing up  Past Medical History:  Diagnosis Date  . Menorrhagia 2014  . Osteoporosis    History reviewed. No pertinent surgical history. Current Outpatient Medications on File Prior to Visit  Medication Sig Dispense Refill  . ALPRAZolam (XANAX) 1 MG tablet TAKE ONE TABLET BY MOUTH AT BEDTIME AS NEEDED FOR ANXIETY 30 tablet 1  . aspirin (EQ ASPIRIN ADULT LOW DOSE) 81 MG EC tablet Take 1 tablet (81 mg total) by mouth daily. Please make an overdue appt with Dr. Lovena Le before anymore refills. Thanks 30 tablet 0  . CARTIA XT 120 MG 24 hr capsule TAKE ONE CAPSULE BY MOUTH ONCE DAILY. 30 capsule 0  . Cholecalciferol 1000 units capsule Take 1,000 Units by mouth daily.    Marland Kitchen diltiazem (CARDIZEM) 30 MG tablet Take 1 tablet (30 mg total) by mouth 3 (three) times daily as needed. At onset of atrial fibrillation/fast heart rate of > 100 for > 5 minutes 30 tablet 0  . ibuprofen (ADVIL,MOTRIN) 200 MG tablet Take 800 mg by mouth every 6 (six) hours as needed for headache or moderate pain.    . meloxicam (MOBIC) 7.5 MG tablet Take 1 tablet (7.5 mg total) by mouth daily. 30 tablet 1  . OVER THE COUNTER MEDICATION Stool softner daily    . Vitamin D, Ergocalciferol, (DRISDOL) 50000 units CAPS capsule Take 1 capsule (50,000 Units total) by  mouth every 7 (seven) days. 12 capsule 0   No current facility-administered medications on file prior to visit.    No Known Allergies Family History  Problem Relation Age of Onset  . Hypertension Mother   . Heart disease Mother        stent placement  . Hyperlipidemia Mother   . Diabetes Maternal Aunt   . Heart disease Maternal Grandmother    Social History   Socioeconomic History  . Marital status: Married    Spouse name: None  . Number of children: None  . Years of education: None  . Highest education level: None  Social Needs  . Financial resource strain: None  . Food insecurity - worry: None  . Food insecurity - inability: None  . Transportation needs - medical: None  . Transportation needs - non-medical: None  Occupational History  . None  Tobacco Use  . Smoking status: Former Smoker    Years: 20.00    Types: Cigarettes  . Smokeless tobacco: Never Used  . Tobacco comment: QUIT 2015  Substance and Sexual Activity  . Alcohol use: Yes    Alcohol/week: 8.4 oz    Types: 14 Standard drinks or equivalent per week  . Drug use: No  . Sexual activity: Yes    Partners: Male    Birth  control/protection: Other-see comments    Comment: vasectomy, INTERCOURSE AGE 17SEXUAL PARTNERS LESS THAN 5  Other Topics Concern  . None  Social History Narrative  . None   Depression screen [facility] 2/9 06/13/2017 04/10/2016 04/23/2014  Decreased Interest 0 0 0  Down, Depressed, Hopeless 0 0 0  PHQ - 2 Score 0 0 0     Review of Systems See hpi    Objective:   Physical Exam  Constitutional: She is oriented to person, place, and time. She appears well-developed and well-nourished. No distress.  HENT:  Head: Normocephalic and atraumatic.  Right Ear: External ear normal.  Left Ear: External ear normal.  Eyes: Conjunctivae are normal. No scleral icterus.  Neck: Normal range of motion. Neck supple. No thyromegaly present.  Cardiovascular: Normal rate, regular rhythm, normal heart sounds  and intact distal pulses.  Pulmonary/Chest: Effort normal and breath sounds normal. No respiratory distress.  Musculoskeletal: She exhibits no edema.  Lymphadenopathy:    She has no cervical adenopathy.  Neurological: She is alert and oriented to person, place, and time.  Skin: Skin is warm and dry. She is not diaphoretic. No erythema.  Psychiatric: She has a normal mood and affect. Her behavior is normal.      Pulse 70   Temp 98.4 F (36.9 C)   Resp 16   Ht 5\' 6"  (1.676 m)   Wt 151 lb 3.2 oz (68.6 kg)   SpO2 97%   BMI 24.40 kg/m   Dg Chest 2 View  Result Date: 06/13/2017 CLINICAL DATA:  Shortness of breath. History of asthma and smoking history. EXAM: CHEST  2 VIEW COMPARISON:  04/23/2016 FINDINGS: The cardiomediastinal silhouette is unremarkable. There is no evidence of focal airspace disease, pulmonary edema, suspicious pulmonary nodule/mass, pleural effusion, or pneumothorax. No acute bony abnormalities are identified. IMPRESSION: No active cardiopulmonary disease. Electronically Signed   By: Margarette Canada M.D.   On: 06/13/2017 17:12         Assessment & Plan:   1. Mild persistent reactive airway disease without complication - start controller med  2. History of tobacco use     Orders Placed This Encounter  Procedures  . DG Chest 2 View    Standing Status:   Future    Number of Occurrences:   1    Standing Expiration Date:   06/13/2018    Order Specific Question:   Reason for Exam (SYMPTOM  OR DIAGNOSIS REQUIRED)    Answer:   worsening asthma, history of tobacco use    Order Specific Question:   Is the patient pregnant?    Answer:   No    Order Specific Question:   Preferred imaging location?    Answer:   External    Meds ordered this encounter  Medications  . levalbuterol (XOPENEX) nebulizer solution 0.63 mg  . fluticasone furoate-vilanterol (BREO ELLIPTA) 100-25 MCG/INH AEPB    Sig: Inhale 1 puff into the lungs daily.    Dispense:  28 each    Refill:  11  .  Albuterol Sulfate (PROAIR RESPICLICK) 332 (90 Base) MCG/ACT AEPB    Sig: Inhale 2 puffs into the lungs every 4 (four) hours as needed.    Dispense:  1 each    Refill:  11     Delman Cheadle, M.D.  Primary Care at [facility] [address] [place], [place] [address] [phone] phone [phone] fax  06/20/17 6:07 AM

## 2017-06-13 NOTE — Patient Instructions (Addendum)
Go to : [email or web address] To get a free trial offer and then a coupon to reduce your copay to $10/mo    IF you received an x-ray today, you will receive an invoice from [facility]. Please contact [facility] at [phone] with questions or concerns regarding your invoice.   IF you received labwork today, you will receive an invoice from [facility]. Please contact [facility] at [phone] with questions or concerns regarding your invoice.   Our billing staff will not be able to assist you with questions regarding bills from these companies.  You will be contacted with the lab results as soon as they are available. The fastest way to get your results is to activate your My Chart account. Instructions are located on the last page of this paperwork. If you have not heard from [place] regarding the results in 2 weeks, please contact this office.      Asthma, Adult Asthma is a recurring condition in which the airways tighten and narrow. Asthma can make it difficult to breathe. It can cause coughing, wheezing, and shortness of breath. Asthma episodes, also called asthma attacks, range from minor to life-threatening. Asthma cannot be cured, but medicines and lifestyle changes can help control it. What are the causes? Asthma is believed to be caused by inherited (genetic) and environmental factors, but its exact cause is unknown. Asthma may be triggered by allergens, lung infections, or irritants in the air. Asthma triggers are different for each person. Common triggers include:  Animal dander.  Dust mites.  Cockroaches.  Pollen from trees or grass.  Mold.  Smoke.  Air pollutants such as dust, household cleaners, hair sprays, aerosol sprays, paint fumes, strong chemicals, or strong odors.  Cold air, weather changes, and winds (which increase molds and pollens in the air).  Strong emotional expressions such as crying or laughing  hard.  Stress.  Certain medicines (such as aspirin) or types of drugs (such as beta-blockers).  Sulfites in foods and drinks. Foods and drinks that may contain sulfites include dried fruit, potato chips, and sparkling grape juice.  Infections or inflammatory conditions such as the flu, a cold, or an inflammation of the nasal membranes (rhinitis).  Gastroesophageal reflux disease (GERD).  Exercise or strenuous activity.  What are the signs or symptoms? Symptoms may occur immediately after asthma is triggered or many hours later. Symptoms include:  Wheezing.  Excessive nighttime or early morning coughing.  Frequent or severe coughing with a common cold.  Chest tightness.  Shortness of breath.  How is this diagnosed? The diagnosis of asthma is made by a review of your medical history and a physical exam. Tests may also be performed. These may include:  Lung function studies. These tests show how much air you breathe in and out.  Allergy tests.  Imaging tests such as X-rays.  How is this treated? Asthma cannot be cured, but it can usually be controlled. Treatment involves identifying and avoiding your asthma triggers. It also involves medicines. There are 2 classes of medicine used for asthma treatment:  Controller medicines. These prevent asthma symptoms from occurring. They are usually taken every day.  Reliever or rescue medicines. These quickly relieve asthma symptoms. They are used as needed and provide short-term relief.  Your health care provider will help you create an asthma action plan. An asthma action plan is a written plan for managing and treating your asthma attacks. It includes a list of your asthma triggers and how they may be avoided. It also includes  information on when medicines should be taken and when their dosage should be changed. An action plan may also involve the use of a device called a peak flow meter. A peak flow meter measures how well the lungs  are working. It helps you monitor your condition. Follow these instructions at home:  Take medicines only as directed by your health care provider. Speak with your health care provider if you have questions about how or when to take the medicines.  Use a peak flow meter as directed by your health care provider. Record and keep track of readings.  Understand and use the action plan to help minimize or stop an asthma attack without needing to seek medical care.  Control your home environment in the following ways to help prevent asthma attacks: ? Do not smoke. Avoid being exposed to secondhand smoke. ? Change your heating and air conditioning filter regularly. ? Limit your use of fireplaces and wood stoves. ? Get rid of pests (such as roaches and mice) and their droppings. ? Throw away plants if you see mold on them. ? Clean your floors and dust regularly. Use unscented cleaning products. ? Try to have someone else vacuum for you regularly. Stay out of rooms while they are being vacuumed and for a short while afterward. If you vacuum, use a dust mask from a hardware store, a double-layered or microfilter vacuum cleaner bag, or a vacuum cleaner with a HEPA filter. ? Replace carpet with wood, tile, or vinyl flooring. Carpet can trap dander and dust. ? Use allergy-proof pillows, mattress covers, and box spring covers. ? Wash bed sheets and blankets every week in hot water and dry them in a dryer. ? Use blankets that are made of polyester or cotton. ? Clean bathrooms and kitchens with bleach. If possible, have someone repaint the walls in these rooms with mold-resistant paint. Keep out of the rooms that are being cleaned and painted. ? Wash hands frequently. Contact a health care provider if:  You have wheezing, shortness of breath, or a cough even if taking medicine to prevent attacks.  The colored mucus you cough up (sputum) is thicker than usual.  Your sputum changes from clear or white to  yellow, green, gray, or bloody.  You have any problems that may be related to the medicines you are taking (such as a rash, itching, swelling, or trouble breathing).  You are using a reliever medicine more than 2-3 times per week.  Your peak flow is still at 50-79% of your personal best after following your action plan for 1 hour.  You have a fever. Get help right away if:  You seem to be getting worse and are unresponsive to treatment during an asthma attack.  You are short of breath even at rest.  You get short of breath when doing very little physical activity.  You have difficulty eating, drinking, or talking due to asthma symptoms.  You develop chest pain.  You develop a fast heartbeat.  You have a bluish color to your lips or fingernails.  You are light-headed, dizzy, or faint.  Your peak flow is less than 50% of your personal best. This information is not intended to replace advice given to you by your health care provider. Make sure you discuss any questions you have with your health care provider. Document Released: 07/30/2005 Document Revised: 01/11/2016 Document Reviewed: 02/26/2013 Elsevier Interactive Patient Education  2017 [employer].

## 2017-06-20 ENCOUNTER — Other Ambulatory Visit: Payer: Self-pay | Admitting: Internal Medicine

## 2017-06-21 ENCOUNTER — Other Ambulatory Visit: Payer: Self-pay | Admitting: Internal Medicine

## 2017-06-21 NOTE — Telephone Encounter (Signed)
Medication Detail    Disp Refills Start End   diltiazem (CARTIA XT) 120 MG 24 hr capsule 15 capsule 0 06/20/2017    Sig - Route: Take 1 capsule (120 mg total) daily by mouth. Please make overdue appt with Dr. Lovena Le before anymore refills. 2nd attempt - Oral   Sent to pharmacy as: diltiazem (CARTIA XT) 120 MG 24 hr capsule   Notes to Pharmacy: Please consider 90 day supplies to promote better adherence   E-Prescribing Status: Receipt confirmed by pharmacy (06/20/2017 4:32 PM EST)   Pharmacy   [facility], [place] [address]

## 2017-07-06 ENCOUNTER — Other Ambulatory Visit: Payer: Self-pay | Admitting: Internal Medicine

## 2017-07-08 ENCOUNTER — Ambulatory Visit: Payer: BC Managed Care – PPO | Admitting: Physician Assistant

## 2017-07-08 ENCOUNTER — Encounter: Payer: Self-pay | Admitting: Physician Assistant

## 2017-07-08 ENCOUNTER — Other Ambulatory Visit: Payer: Self-pay

## 2017-07-08 VITALS — BP 124/78 | HR 79 | Temp 97.6°F | Resp 18 | Ht 66.69 in | Wt 150.0 lb

## 2017-07-08 DIAGNOSIS — R062 Wheezing: Secondary | ICD-10-CM

## 2017-07-08 DIAGNOSIS — R059 Cough, unspecified: Secondary | ICD-10-CM

## 2017-07-08 DIAGNOSIS — J069 Acute upper respiratory infection, unspecified: Secondary | ICD-10-CM | POA: Diagnosis not present

## 2017-07-08 DIAGNOSIS — R05 Cough: Secondary | ICD-10-CM | POA: Diagnosis not present

## 2017-07-08 DIAGNOSIS — J9801 Acute bronchospasm: Secondary | ICD-10-CM

## 2017-07-08 LAB — POCT CBC
Granulocyte percent: 69.9 %G (ref 37–80)
HCT, POC: 41.7 % (ref 37.7–47.9)
Hemoglobin: 14.5 g/dL (ref 12.2–16.2)
Lymph, poc: 1.7 (ref 0.6–3.4)
MCH, POC: 32.1 pg — AB (ref 27–31.2)
MCHC: 34.7 g/dL (ref 31.8–35.4)
MCV: 92.3 fL (ref 80–97)
MID (cbc): 0.4 (ref 0–0.9)
MPV: 7.2 fL (ref 0–99.8)
POC Granulocyte: 5 (ref 2–6.9)
POC LYMPH PERCENT: 24.4 %L (ref 10–50)
POC MID %: 5.7 %M (ref 0–12)
Platelet Count, POC: 278 10*3/uL (ref 142–424)
RBC: 4.52 M/uL (ref 4.04–5.48)
RDW, POC: 11.7 %
WBC: 7.1 10*3/uL (ref 4.6–10.2)

## 2017-07-08 MED ORDER — IPRATROPIUM BROMIDE 0.02 % IN SOLN
0.5000 mg | Freq: Once | RESPIRATORY_TRACT | Status: AC
  Administered 2017-07-08: 0.5 mg via RESPIRATORY_TRACT

## 2017-07-08 MED ORDER — IPRATROPIUM BROMIDE 0.03 % NA SOLN: 2.0000 | Freq: Two times a day (BID) | NASAL | 0 refills | Status: DC

## 2017-07-08 MED ORDER — PREDNISONE 20 MG PO TABS: 40.0000 mg | ORAL_TABLET | Freq: Every day | ORAL | 0 refills | Status: AC

## 2017-07-08 MED ORDER — ALBUTEROL SULFATE (2.5 MG/3ML) 0.083% IN NEBU
2.5000 mg | INHALATION_SOLUTION | Freq: Once | RESPIRATORY_TRACT | Status: AC
  Administered 2017-07-08: 2.5 mg via RESPIRATORY_TRACT

## 2017-07-08 MED ORDER — HYDROCODONE-HOMATROPINE 5-1.5 MG/5ML PO SYRP: 5.0000 mL | ORAL_SOLUTION | Freq: Three times a day (TID) | ORAL | 0 refills | Status: DC | PRN

## 2017-07-08 MED ORDER — BENZONATATE 100 MG PO CAPS: 100.0000 mg | ORAL_CAPSULE | Freq: Three times a day (TID) | ORAL | 0 refills | Status: DC | PRN

## 2017-07-08 NOTE — Progress Notes (Signed)
MRN: [id number] DOB: 04-12-1961  Subjective:   Alexandra Grant is a 56 y.o. female presenting for chief complaint of Cough (X 1 week) and Sinus Problem (X 1week- nasal congestion) .  Reports 7 day history of illness. Started out with sneezing, sore throat, and rhinorrhea. Had body aches the second day but that resolved.  It then moved down to her chest. She now has associated productive cough, nasal congestion, and hoarse voice.  The cough is keeping her up at night. Has tried alka seltzer and meloxicam with moderate relief. Denies fever, sinus pain, ear pain, wheezing and shortness of breath, night sweats, chills, nausea, vomiting, abdominal pain and diarrhea. Has had sick contact with grandchild, who got sick at daycare. No history of seasonal allergies, has history of asthma. Started breo ellipta inhaler a few weeks ago and has not had to use her rescue inhaler since. Patient has had flu shot this season. Denies smoking. Denies any other aggravating or relieving factors, no other questions or concerns.  Alexandra Grant has a current medication list which includes the following prescription(s): albuterol sulfate, alprazolam, aspirin, cholecalciferol, diltiazem, diltiazem, fluticasone furoate-vilanterol, ibuprofen, meloxicam, OVER THE COUNTER MEDICATION, and vitamin d (ergocalciferol), and the following Facility-Administered Medications: levalbuterol. Also has No Known Allergies.  Alexandra Grant  has a past medical history of Menorrhagia (2014) and Osteoporosis. Also  has no past surgical history on file.   Objective:   Vitals: BP 124/78 (BP Location: Left Arm, Patient Position: Sitting, Cuff Size: Normal)   Pulse 79   Temp 97.6 F (36.4 C) (Oral)   Resp 18   Ht 5' 6.69" (1.694 m)   Wt 150 lb (68 kg)   SpO2 98%   BMI 23.71 kg/m   Physical Exam  Constitutional: She is oriented to person, place, and time. She appears well-developed and well-nourished.  HENT:  Head: Normocephalic and atraumatic.  Right Ear:  Tympanic membrane, external ear and ear canal normal.  Left Ear: Tympanic membrane, external ear and ear canal normal.  Nose: Mucosal edema (moderate bilaterally) present. Right sinus exhibits no maxillary sinus tenderness and no frontal sinus tenderness. Left sinus exhibits no maxillary sinus tenderness and no frontal sinus tenderness.  Mouth/Throat: Uvula is midline, oropharynx is clear and moist and mucous membranes are normal. Tonsils are 1+ on the right. Tonsils are 1+ on the left. No tonsillar exudate.  Eyes: Conjunctivae are normal.  Neck: Normal range of motion.  Cardiovascular: Normal rate, regular rhythm and normal heart sounds.  Pulmonary/Chest: Effort normal. No accessory muscle usage. No tachypnea. No respiratory distress. She has no decreased breath sounds. She has wheezes (moderate, diffuse throughout lung fields ). She has rhonchi in the right middle field and the right lower field. She has no rales.  Lymphadenopathy:       Head (right side): No submental, no submandibular, no tonsillar, no preauricular, no posterior auricular and no occipital adenopathy present.       Head (left side): No submental, no submandibular, no tonsillar, no preauricular, no posterior auricular and no occipital adenopathy present.    She has no cervical adenopathy.       Right: No supraclavicular adenopathy present.       Left: No supraclavicular adenopathy present.  Neurological: She is alert and oriented to person, place, and time.  Skin: Skin is warm and dry.  Psychiatric: She has a normal mood and affect.  Vitals reviewed.   Results for orders placed or performed in visit on 07/08/17 (from the past  24 hour(s))  POCT CBC     Status: Abnormal   Collection Time: 07/08/17 12:02 PM  Result Value Ref Range   WBC 7.1 4.6 - 10.2 K/uL   Lymph, poc 1.7 0.6 - 3.4   POC LYMPH PERCENT 24.4 10 - 50 %L   MID (cbc) 0.4 0 - 0.9   POC MID % 5.7 0 - 12 %M   POC Granulocyte 5.0 2 - 6.9   Granulocyte percent  69.9 37 - 80 %G   RBC 4.52 4.04 - 5.48 M/uL   Hemoglobin 14.5 12.2 - 16.2 g/dL   HCT, POC 41.7 37.7 - 47.9 %   MCV 92.3 80 - 97 fL   MCH, POC 32.1 (A) 27 - 31.2 pg   MCHC 34.7 31.8 - 35.4 g/dL   RDW, POC 11.7 %   Platelet Count, POC 278 142 - 424 K/uL   MPV 7.2 0 - 99.8 fL   Wheezing improved significantly with DuoNeb.  Rhonchi completely resolved.  Patient admits she feels less congested.  Assessment and Plan :  1. Wheezing Improved with duoneb.  - albuterol (PROVENTIL) (2.5 MG/3ML) 0.083% nebulizer solution 2.5 mg - ipratropium (ATROVENT) nebulizer solution 0.5 mg - POCT CBC 2. Cough - benzonatate (TESSALON) 100 MG capsule; Take 1-2 capsules (100-200 mg total) by mouth 3 (three) times daily as needed for cough.  Dispense: 40 capsule; Refill: 0 - HYDROcodone-homatropine (HYCODAN) 5-1.5 MG/5ML syrup; Take 5 mLs by mouth every 8 (eight) hours as needed for cough.  Dispense: 120 mL; Refill: 0 3. Acute upper respiratory infection Pt well appearing. Vitals stable. She is afebrile. WBC nml. Rhonchi cleared on lung exam and wheezing significantly improved with duoneb. Likely viral etiology. Will treat bronchospasm with prednisone at this time.  Encouraged to use rescue inhaler every 4-6 hours as needed.  Also given symptomatic treatment. Encouraged to return to clinic if symptoms worsen, do not improve in 5-7 days, or as needed. - ipratropium (ATROVENT) 0.03 % nasal spray; Place 2 sprays into both nostrils 2 (two) times daily.  Dispense: 30 mL; Refill: 0 4. Bronchospasm - predniSONE (DELTASONE) 20 MG tablet; Take 2 tablets (40 mg total) by mouth daily with breakfast for 5 days.  Dispense: 10 tablet; Refill: 0  Tenna Delaine, PA-C  Primary Care at [facility] 07/08/2017 6:53 PM

## 2017-07-08 NOTE — Patient Instructions (Addendum)
- We will treat this as a respiratory viral infection with bronchospasm. - I recommend you rest, drink plenty of fluids, eat light meals including soups.   -We are going to treat your underlying inflammation with oral prednisone. Prednisone is a steroid and can cause side effects such as headache, irritability, nausea, vomiting, increased heart rate, increased blood pressure, increased blood sugar, appetite changes, and insomnia. Please take tablets in the morning with a full meal to help decrease the chances of these side effects.  -Use your albuterol inhaler every 4-6 hours as needed for wheezing and cough. - You may also use cough syrup at night for your cough , Tessalon pearls during the day. Be aware that cough syrup can definitely make you drowsy and sleepy so do not drive or operate any heavy machinery if it is affecting you during the day.  -He may use Atrovent nasal spray for runny nose. - Please let me know if you are not seeing any improvement in 5-7 days or you get worse.  Thank you for letting me participate in your health and well being.    Bronchospasm, Adult Bronchospasm is when airways in the lungs get smaller. When this happens, it can be hard to breathe. You may cough. You may also make a whistling sound when you breathe (wheeze). Follow these instructions at home: Medicines  Take over-the-counter and prescription medicines only as told by your doctor.  If you need to use an inhaler or nebulizer to take your medicine, ask your doctor how to use it.  If you were given a spacer, always use it with your inhaler. Lifestyle  Change your heating and air conditioning filter. Do this at least once a month.  Try not to use fireplaces and wood stoves.  Do not  smoke. Do not  allow smoking in your home.  Try not to use things that have a strong smell, like perfume.  Get rid of pests (such as roaches and mice) and their poop.  Remove any mold from your home.  Keep your house  clean. Get rid of dust.  Use cleaning products that have no smell.  Replace carpet with wood, tile, or vinyl flooring.  Use allergy-proof pillows, mattress covers, and box spring covers.  Wash bed sheets and blankets every week. Use hot water. Dry them in a dryer.  Use blankets that are made of polyester or cotton.  Wash your hands often.  Keep pets out of your bedroom.  When you exercise, try not to breathe in cold air. General instructions  Have a plan for getting medical care. Know these things: ? When to call your doctor. ? When to call local emergency services (911 in the [place] ? Where to go in an emergency.  Stay up to date on your shots (immunizations).  When you have an episode: ? Stay calm. ? Relax. ? Breathe slowly. Contact a doctor if:  Your muscles ache.  Your chest hurts.  The color of the mucus you cough up (sputum) changes from clear or white to yellow, green, gray, or bloody.  The mucus you cough up gets thicker.  You have a fever. Get help right away if:  The whistling sound gets worse, even after you take your medicines.  Your coughing gets worse.  You find it even harder to breathe.  Your chest hurts very much. Summary  Bronchospasm is when airways in the lungs get smaller.  When this happens, it can be hard to breathe. You may  cough. You may also make a whistling sound when you breathe.  Stay away from things that cause you to have episodes. These include smoke or dust. This information is not intended to replace advice given to you by your health care provider. Make sure you discuss any questions you have with your health care provider. Document Released: 05/27/2009 Document Revised: 08/02/2016 Document Reviewed: 08/02/2016 Elsevier Interactive Patient Education  2017 [employer].      Upper Respiratory Infection, Adult Most upper respiratory infections (URIs) are caused by a virus. A URI affects the nose, throat, and upper  air passages. The most common type of URI is often called "the common cold." Follow these instructions at home:  Take medicines only as told by your doctor.  Gargle warm saltwater or take cough drops to comfort your throat as told by your doctor.  Use a warm mist humidifier or inhale steam from a shower to increase air moisture. This may make it easier to breathe.  Drink enough fluid to keep your pee (urine) clear or pale yellow.  Eat soups and other clear broths.  Have a healthy diet.  Rest as needed.  Go back to work when your fever is gone or your doctor says it is okay. ? You may need to stay home longer to avoid giving your URI to others. ? You can also wear a face mask and wash your hands often to prevent spread of the virus.  Use your inhaler more if you have asthma.  Do not use any tobacco products, including cigarettes, chewing tobacco, or electronic cigarettes. If you need help quitting, ask your doctor. Contact a doctor if:  You are getting worse, not better.  Your symptoms are not helped by medicine.  You have chills.  You are getting more short of breath.  You have brown or red mucus.  You have yellow or brown discharge from your nose.  You have pain in your face, especially when you bend forward.  You have a fever.  You have puffy (swollen) neck glands.  You have pain while swallowing.  You have white areas in the back of your throat. Get help right away if:  You have very bad or constant: ? Headache. ? Ear pain. ? Pain in your forehead, behind your eyes, and over your cheekbones (sinus pain). ? Chest pain.  You have long-lasting (chronic) lung disease and any of the following: ? Wheezing. ? Long-lasting cough. ? Coughing up blood. ? A change in your usual mucus.  You have a stiff neck.  You have changes in your: ? Vision. ? Hearing. ? Thinking. ? Mood. This information is not intended to replace advice given to you by your health care  provider. Make sure you discuss any questions you have with your health care provider. Document Released: 01/16/2008 Document Revised: 04/01/2016 Document Reviewed: 11/04/2013 Elsevier Interactive Patient Education  2018 [employer].   IF you received an x-ray today, you will receive an invoice from [facility]. Please contact [facility] at [phone] with questions or concerns regarding your invoice.   IF you received labwork today, you will receive an invoice from [facility]. Please contact [facility] at [phone] with questions or concerns regarding your invoice.   Our billing staff will not be able to assist you with questions regarding bills from these companies.  You will be contacted with the lab results as soon as they are available. The fastest way to get your results is to activate your My Chart  account. Instructions are located on the last page of this paperwork. If you have not heard from [place] regarding the results in 2 weeks, please contact this office.

## 2017-07-10 ENCOUNTER — Telehealth: Payer: Self-pay | Admitting: Internal Medicine

## 2017-07-10 ENCOUNTER — Other Ambulatory Visit: Payer: Self-pay | Admitting: *Deleted

## 2017-07-10 MED ORDER — DILTIAZEM HCL ER COATED BEADS 120 MG PO CP24: 120.0000 mg | ORAL_CAPSULE | Freq: Every day | ORAL | 1 refills | Status: DC

## 2017-07-10 NOTE — Telephone Encounter (Signed)
New message       *STAT* If patient is at the pharmacy, call can be transferred to refill team.   1. Which medications need to be refilled? (please list name of each medication and dose if known)  diltiazem (CARTIA XT) 120 MG 24 hr capsule Take 1 capsule (120 mg total) daily by mouth. Please make overdue appt with Dr. Lovena Le before anymore refills. 2nd attempt     2. Which pharmacy/location (including street and city if local pharmacy) is medication to be sent to? [facility] on [address]  3. Do they need a 30 day or 90 day supply?  Has appt Jan 2 with Tommye Standard

## 2017-07-16 ENCOUNTER — Other Ambulatory Visit: Payer: Self-pay | Admitting: Internal Medicine

## 2017-08-02 ENCOUNTER — Other Ambulatory Visit: Payer: Self-pay | Admitting: Women's Health

## 2017-08-02 DIAGNOSIS — G4709 Other insomnia: Secondary | ICD-10-CM

## 2017-08-06 NOTE — Telephone Encounter (Signed)
Ok for refill? 

## 2017-08-07 NOTE — Telephone Encounter (Signed)
Rx called in 

## 2017-08-12 NOTE — Progress Notes (Signed)
Cardiology Office Note Date:  08/14/2017  Patient ID:  Alexandra Grant, Alexandra Grant 1961/04/12, MRN [id number] PCP:  Shawnee Knapp, MD  Cardiologist:  Dr. Lovena Le    Chief Complaint: over due visit  History of Present Illness: Alexandra Grant is a 56 y.o. female with history of SVT, PAFib, osteoporosis, ?asthma.  She comes today to be seen for Dr. Lovena Le, last seen by him Sept 2017, at that time she had an ER visit with what was described as an SVT given adenosine that resulted in rapid Afib, spontaneously converted with diltiazem IV, and maintained on PO.  She is feeling very well.  She works at [employer] and will walk laps around her building/area on campus or up/down steps in [employer], usually 4 floors at a time, without difficulty or changes in her exertional capacity.  She denies any kind of CP, she has not had any kind of palpitations, tolerating the diltiazem well without need for any PRN doses.  No dizziness, near syncope or syncope.  She see her PMD for routine labs, screening and care.   Past Medical History:  Diagnosis Date  . Menorrhagia 2014  . Osteoporosis     History reviewed. No pertinent surgical history.  Current Outpatient Medications  Medication Sig Dispense Refill  . Albuterol Sulfate (PROAIR RESPICLICK) 235 (90 Base) MCG/ACT AEPB Inhale 2 puffs into the lungs every 4 (four) hours as needed. 1 each 11  . ALPRAZolam (XANAX) 1 MG tablet TAKE 1 TABLET BY MOUTH AT BEDTIME AS NEEDED FOR ANXIETY 30 tablet 1  . benzonatate (TESSALON) 100 MG capsule Take 1-2 capsules (100-200 mg total) by mouth 3 (three) times daily as needed for cough. 40 capsule 0  . Cholecalciferol 1000 units capsule Take 1,000 Units by mouth daily.    Marland Kitchen diltiazem (CARDIZEM) 30 MG tablet Take 1 tablet (30 mg total) by mouth 3 (three) times daily as needed. At onset of atrial fibrillation/fast heart rate 30 tablet 3  . diltiazem (CARTIA XT) 120 MG 24 hr capsule Take 1 capsule (120 mg total) by mouth daily. Patient  needs to keep 08/14/17 appointment for further refills 90 capsule 1  . EQ ASPIRIN ADULT LOW DOSE 81 MG EC tablet TAKE 1 TABLET BY MOUTH DAILY. 90 tablet 0  . fluticasone furoate-vilanterol (BREO ELLIPTA) 100-25 MCG/INH AEPB Inhale 1 puff into the lungs daily. 28 each 11  . ibuprofen (ADVIL,MOTRIN) 200 MG tablet Take 800 mg by mouth every 6 (six) hours as needed for headache or moderate pain.    Marland Kitchen ipratropium (ATROVENT) 0.03 % nasal spray Place 2 sprays into both nostrils 2 (two) times daily. 30 mL 0  . meloxicam (MOBIC) 7.5 MG tablet Take 1 tablet (7.5 mg total) by mouth daily. 30 tablet 1  . OVER THE COUNTER MEDICATION Stool softner daily     Current Facility-Administered Medications  Medication Dose Route Frequency Provider Last Rate Last Dose  . levalbuterol (XOPENEX) nebulizer solution 0.63 mg  0.63 mg Nebulization Once Shawnee Knapp, MD        Allergies:   Patient has no known allergies.   Social History:  The patient  reports that she has quit smoking. Her smoking use included cigarettes. She quit after 20.00 years of use. she has never used smokeless tobacco. She reports that she drinks about 8.4 oz of alcohol per week. She reports that she does not use drugs.   Family History:  The patient's family history includes Diabetes in her maternal aunt;  Heart disease in her maternal grandmother and mother; Hyperlipidemia in her mother; Hypertension in her mother.  ROS:  Please see the history of present illness.    All other systems are reviewed and otherwise negative.   PHYSICAL EXAM:  VS:  BP 120/78   Pulse 62   Ht 5\' 6"  (1.676 m)   Wt 154 lb 9.6 oz (70.1 kg)   SpO2 98%   BMI 24.95 kg/m  BMI: Body mass index is 24.95 kg/m. Well nourished, well developed, in no acute distress  HEENT: normocephalic, atraumatic  Neck: no JVD, carotid bruits or masses Cardiac:  RRR; no murmurs, no rubs, or gallops Lungs:  CTA b/l, no wheezing, rhonchi or rales  Abd: soft, nontender MS: no deformity or  atrophy Ext: no edema  Skin: warm and dry, no rash Neuro:  No gross deficits appreciated Psych: euthymic Grant, full affect   EKG:  Done today and reviewed by myself is an ectopic atrial rhythm/low atrial, 60bpm   Recent Labs: 12/25/2016: ALT 16; BUN 12; Creat 0.86; Platelets 254; Potassium 4.0; Sodium 141 07/08/2017: Hemoglobin 14.5  12/25/2016: Cholesterol 169; HDL 70; LDL Cholesterol 84; Total CHOL/HDL Ratio 2.4; Triglycerides 74; VLDL 15   CrCl cannot be calculated (Patient's most recent lab result is older than the maximum 21 days allowed.).   Wt Readings from Last 3 Encounters:  08/14/17 154 lb 9.6 oz (70.1 kg)  07/08/17 150 lb (68 kg)  06/13/17 151 lb 3.2 oz (68.6 kg)     Other studies reviewed: Additional studies/records reviewed today include: summarized above  ASSESSMENT AND PLAN:  1. Paroxysmal Afib     CHA2DS2Vasc is one, not on a/c  2. SVT  3. Abnormal EKG     She is known to have a competing low atrial/CS foci     Today's appears similar to last year's    She has not had any symptoms to suggest recurrent AFib or SVT, continue the same regime.         Disposition: F/u annual, sooner if needed.    Current medicines are reviewed at length with the patient today.  The patient did not have any concerns regarding medicines.  Venetia Night, PA-C 08/14/2017 4:04 PM     [facility] [address] [place] [address] [phone] (office)  [phone] (fax)

## 2017-08-14 ENCOUNTER — Encounter: Payer: Self-pay | Admitting: Physician Assistant

## 2017-08-14 ENCOUNTER — Ambulatory Visit (INDEPENDENT_AMBULATORY_CARE_PROVIDER_SITE_OTHER): Payer: BC Managed Care – PPO | Admitting: Physician Assistant

## 2017-08-14 VITALS — BP 120/78 | HR 62 | Ht 66.0 in | Wt 154.6 lb

## 2017-08-14 DIAGNOSIS — I480 Paroxysmal atrial fibrillation: Secondary | ICD-10-CM | POA: Diagnosis not present

## 2017-08-14 DIAGNOSIS — I471 Supraventricular tachycardia: Secondary | ICD-10-CM | POA: Diagnosis not present

## 2017-08-14 MED ORDER — DILTIAZEM HCL 30 MG PO TABS: 30.0000 mg | ORAL_TABLET | Freq: Three times a day (TID) | ORAL | 3 refills | Status: DC | PRN

## 2017-08-14 MED ORDER — DILTIAZEM HCL 30 MG PO TABS: 30.0000 mg | ORAL_TABLET | Freq: Three times a day (TID) | ORAL | 0 refills | Status: DC | PRN

## 2017-08-14 MED ORDER — DILTIAZEM HCL ER COATED BEADS 120 MG PO CP24: 120.0000 mg | ORAL_CAPSULE | Freq: Every day | ORAL | 1 refills | Status: DC

## 2017-08-14 NOTE — Patient Instructions (Signed)
Medication Instructions:   Your physician recommends that you continue on your current medications as directed. Please refer to the Current Medication list given to you today.   If you need a refill on your cardiac medications before your next appointment, please call your pharmacy.  Labwork: NONE ORDERED  TODAY     Testing/Procedures: NONE ORDERED  TODAY    Follow-Up:  Your physician wants you to follow-up in: ONE YEAR WITH  TAYLOR You will receive a reminder letter in the mail two months in advance. If you don't receive a letter, please call our office to schedule the follow-up appointment.     Any Other Special Instructions Will Be Listed Below (If Applicable).                                                                                                                                                   

## 2017-09-09 ENCOUNTER — Ambulatory Visit: Payer: BC Managed Care – PPO | Admitting: Family Medicine

## 2017-09-09 ENCOUNTER — Encounter: Payer: Self-pay | Admitting: Family Medicine

## 2017-09-09 VITALS — BP 123/83 | HR 87 | Temp 98.8°F | Resp 16 | Ht 66.0 in | Wt 152.6 lb

## 2017-09-09 DIAGNOSIS — R509 Fever, unspecified: Secondary | ICD-10-CM

## 2017-09-09 DIAGNOSIS — J111 Influenza due to unidentified influenza virus with other respiratory manifestations: Secondary | ICD-10-CM | POA: Diagnosis not present

## 2017-09-09 DIAGNOSIS — J4531 Mild persistent asthma with (acute) exacerbation: Secondary | ICD-10-CM

## 2017-09-09 LAB — POC INFLUENZA A&B (BINAX/QUICKVUE)
Influenza A, POC: POSITIVE — AB
Influenza B, POC: NEGATIVE

## 2017-09-09 MED ORDER — PREDNISONE 10 MG PO TABS: ORAL_TABLET | ORAL | 0 refills | Status: DC

## 2017-09-09 MED ORDER — AZITHROMYCIN 250 MG PO TABS: ORAL_TABLET | ORAL | 0 refills | Status: DC

## 2017-09-09 MED ORDER — OSELTAMIVIR PHOSPHATE 75 MG PO CAPS: 75.0000 mg | ORAL_CAPSULE | Freq: Two times a day (BID) | ORAL | 0 refills | Status: AC

## 2017-09-09 NOTE — Progress Notes (Signed)
Subjective:    Patient ID: Alexandra Grant, female    DOB: 1960-12-09, 57 y.o.   MRN: [id number] Chief Complaint  Patient presents with  . Cough    x 1 month, daughter has cough too x 10month  . Generalized Body Aches  . Fever    unspecified    HPI  Was here 2 mos ago and started on Breo 11/1.  3 weeks lateral She got flu-like sxs around Thanksgiving (since 11/26) and has had a deep paroxysmal cough ever since. Her coworkers tell her she sounds like she has tuberculosis.   Over this past weekend she very suddenly got dramatically worse with body aches, was in bed all day - a secondary acute illness after never got all the way better since 2 mos prior. Temp yesterday was 101.8.  Is not taking anything extra for sxs.  Started trying to skip a day from the [place] as when she inhales it, she feels like she can feel it landing her lungs.  Uses albuterol twice a day at baseline.  Felt like this was controlled her sxs for years prior.  Never formally diagnosed w/ asthma until PFTs 11/1.  She stopped smoking several years ago.  Both her parents smoked so lots of second hand smoke exposure growing up Did get flu shot this year.   The breathing treatments really make her heart race and she has a. Fib so really doesn't want to do one.   Has never been formally diagnosed with asthma. Always atrributed to her heart problems. She thought she had GERD until finally her mom told her her dad had adult onset asthma and then it clicked. But then it started happening at night.  And the rescue inhaler fixed it.   Has tessalon pearles at home in case they are needed.   Past Medical History:  Diagnosis Date  . Menorrhagia 2014  . Osteoporosis    No past surgical history on file. Current Outpatient Medications on File Prior to Visit  Medication Sig Dispense Refill  . Albuterol Sulfate (PROAIR RESPICLICK) 725 (90 Base) MCG/ACT AEPB Inhale 2 puffs into the lungs every 4 (four) hours as needed. 1 each 11  .  ALPRAZolam (XANAX) 1 MG tablet TAKE 1 TABLET BY MOUTH AT BEDTIME AS NEEDED FOR ANXIETY 30 tablet 1  . Cholecalciferol 1000 units capsule Take 1,000 Units by mouth daily.    Marland Kitchen diltiazem (CARDIZEM) 30 MG tablet Take 1 tablet (30 mg total) by mouth 3 (three) times daily as needed. At onset of atrial fibrillation/fast heart rate 30 tablet 3  . diltiazem (CARTIA XT) 120 MG 24 hr capsule Take 1 capsule (120 mg total) by mouth daily. Patient needs to keep 08/14/17 appointment for further refills 90 capsule 1  . EQ ASPIRIN ADULT LOW DOSE 81 MG EC tablet TAKE 1 TABLET BY MOUTH DAILY. 90 tablet 0  . ibuprofen (ADVIL,MOTRIN) 200 MG tablet Take 800 mg by mouth every 6 (six) hours as needed for headache or moderate pain.    . meloxicam (MOBIC) 7.5 MG tablet Take 1 tablet (7.5 mg total) by mouth daily. 30 tablet 1  . OVER THE COUNTER MEDICATION Stool softner daily     No current facility-administered medications on file prior to visit.    No Known Allergies Family History  Problem Relation Age of Onset  . Hypertension Mother   . Heart disease Mother        stent placement  . Hyperlipidemia Mother   .  Diabetes Maternal Aunt   . Heart disease Maternal Grandmother    Social History   Socioeconomic History  . Marital status: Married    Spouse name: None  . Number of children: None  . Years of education: None  . Highest education level: None  Social Needs  . Financial resource strain: None  . Food insecurity - worry: None  . Food insecurity - inability: None  . Transportation needs - medical: None  . Transportation needs - non-medical: None  Occupational History  . None  Tobacco Use  . Smoking status: Former Smoker    Years: 20.00    Types: Cigarettes  . Smokeless tobacco: Never Used  . Tobacco comment: QUIT 2015  Substance and Sexual Activity  . Alcohol use: Yes    Alcohol/week: 8.4 oz    Types: 14 Standard drinks or equivalent per week  . Drug use: No  . Sexual activity: Yes     Partners: Male    Birth control/protection: Other-see comments    Comment: vasectomy, INTERCOURSE AGE 17SEXUAL PARTNERS LESS THAN 5  Other Topics Concern  . None  Social History Narrative  . None   Depression screen [facility] 2/9 09/09/2017 07/08/2017 06/13/2017 04/10/2016 04/23/2014  Decreased Interest 0 0 0 0 0  Down, Depressed, Hopeless 0 0 0 0 0  PHQ - 2 Score 0 0 0 0 0    Review of Systems  Constitutional: Positive for activity change, appetite change, chills, diaphoresis, fatigue and fever.  HENT: Positive for ear pain and sore throat. Negative for congestion, ear discharge, mouth sores, nosebleeds, postnasal drip, rhinorrhea, sinus pressure, sneezing, trouble swallowing and voice change.   Eyes: Positive for photophobia. Negative for pain.  Respiratory: Positive for cough, shortness of breath and wheezing.   Cardiovascular: Negative for chest pain.  Gastrointestinal: Positive for constipation and nausea. Negative for abdominal pain and vomiting.  Genitourinary: Positive for decreased urine volume and frequency. Negative for dysuria.  Musculoskeletal: Positive for myalgias. Negative for arthralgias, gait problem, joint swelling, neck pain and neck stiffness.  Neurological: Positive for weakness, light-headedness and headaches. Negative for dizziness and syncope.  Hematological: Negative for adenopathy.  Psychiatric/Behavioral: Positive for sleep disturbance.       Objective:   Physical Exam  Constitutional: She is oriented to person, place, and time. She appears well-developed and well-nourished. She appears ill. No distress.  HENT:  Head: Normocephalic and atraumatic.  Right Ear: External ear and ear canal normal. Tympanic membrane is erythematous.  Left Ear: External ear and ear canal normal. Tympanic membrane is erythematous.  Nose: Rhinorrhea present. No mucosal edema.  Mouth/Throat: Uvula is midline and mucous membranes are normal. Posterior oropharyngeal erythema present. No  oropharyngeal exudate.  Eyes: Conjunctivae are normal. Right eye exhibits no discharge. Left eye exhibits no discharge. No scleral icterus.  Neck: Normal range of motion. Neck supple.  Cardiovascular: Normal rate, regular rhythm, normal heart sounds and intact distal pulses.  Pulmonary/Chest: Effort normal. She has wheezes (end expiratory bibasilar).  Frequent congested sounding cough  Abdominal: Soft. Bowel sounds are normal. She exhibits no distension and no mass. There is no tenderness. There is no rebound and no guarding.  Lymphadenopathy:    She has cervical adenopathy.       Right: No supraclavicular adenopathy present.       Left: No supraclavicular adenopathy present.  Neurological: She is alert and oriented to person, place, and time.  Skin: Skin is warm and dry. She is not diaphoretic. No erythema.  Psychiatric: She has a normal mood and affect. Her behavior is normal.   Pt refuses nebulizer treatment in office - even xopenex neb triggers her a. Fib.   BP 123/83   Pulse 87   Temp 98.8 F (37.1 C) (Oral)   Resp 16   Ht 5\' 6"  (1.676 m)   Wt 152 lb 9.6 oz (69.2 kg)   SpO2 95%   BMI 24.63 kg/m    Results for orders placed or performed in visit on 09/09/17  POC Influenza A&B(BINAX/QUICKVUE)  Result Value Ref Range   Influenza A, POC Positive (A) Negative   Influenza B, POC Negative Negative       Assessment & Plan:   1. Fever, unspecified   2. Influenza with respiratory manifestation   3. Mild persistent asthma with acute exacerbation    + flu but also pt has had 2 mos of prolonged hacking cough and generalized feeling unwell predating this secondary acute illness so will cover for h/o walking pneumonia with zpack as well.  Recheck in 3 wks to ensure resolved and needs CBC and CXR if cough not gone.  Return to using albuterol bid and prn for now as pt felt this was working effectively for her prior and she has had a cough every since starting the Breo several mos  ago. Perhaps with dry powder is irritating her throat. Then at our recheck in 3 weeks, if her cough had resolved and she was stable, we could try a non-powder maintenance inhaler like Flovent.    Orders Placed This Encounter  Procedures  . POC Influenza A&B(BINAX/QUICKVUE)    Meds ordered this encounter  Medications  . oseltamivir (TAMIFLU) 75 MG capsule    Sig: Take 1 capsule (75 mg total) by mouth 2 (two) times daily for 5 days.    Dispense:  10 capsule    Refill:  0  . azithromycin (ZITHROMAX) 250 MG tablet    Sig: Take 2 tabs PO x 1 dose, then 1 tab PO QD x 4 days    Dispense:  6 tablet    Refill:  0  . predniSONE (DELTASONE) 10 MG tablet    Sig: 6-5-4-3-2-1 tabs po qd    Dispense:  21 tablet    Refill:  0  . fluticasone (FLOVENT HFA) 110 MCG/ACT inhaler    Sig: Inhale 2 puffs into the lungs 2 (two) times daily.    Dispense:  1 Inhaler    Refill:  3    D/C Breo rx    Delman Cheadle, M.D.  [facility] at [facility] [address] [place], [place] [address] [phone] phone [phone] fax  09/11/17 11:50 AM

## 2017-09-09 NOTE — Patient Instructions (Addendum)
IF you received an x-ray today, you will receive an invoice from [facility]. Please contact [facility] at [phone] with questions or concerns regarding your invoice.   IF you received labwork today, you will receive an invoice from [facility]. Please contact [facility] at [phone] with questions or concerns regarding your invoice.   Our billing staff will not be able to assist you with questions regarding bills from these companies.  You will be contacted with the lab results as soon as they are available. The fastest way to get your results is to activate your My Chart account. Instructions are located on the last page of this paperwork. If you have not heard from [place] regarding the results in 2 weeks, please contact this office.     Influenza, Adult Influenza, more commonly known as "the flu," is a viral infection that primarily affects the respiratory tract. The respiratory tract includes organs that help you breathe, such as the lungs, nose, and throat. The flu causes many common cold symptoms, as well as a high fever and body aches. The flu spreads easily from person to person (is contagious). Getting a flu shot (influenza vaccination) every year is the best way to prevent influenza. What are the causes? Influenza is caused by a virus. You can catch the virus by:  Breathing in droplets from an infected person's cough or sneeze.  Touching something that was recently contaminated with the virus and then touching your mouth, nose, or eyes.  What increases the risk? The following factors may make you more likely to get the flu:  Not cleaning your hands frequently with soap and water or alcohol-based hand sanitizer.  Having close contact with many people during cold and flu season.  Touching your mouth, eyes, or nose without washing or sanitizing your hands first.  Not drinking enough fluids or not eating a healthy diet.  Not getting enough sleep or  exercise.  Being under a high amount of stress.  Not getting a yearly (annual) flu shot.  You may be at a higher risk of complications from the flu, such as a severe lung infection (pneumonia), if you:  Are over the age of 66.  Are pregnant.  Have a weakened disease-fighting system (immune system). You may have a weakened immune system if you: ? Have HIV or AIDS. ? Are undergoing chemotherapy. ? Aretaking medicines that reduce the activity of (suppress) the immune system.  Have a long-term (chronic) illness, such as heart disease, kidney disease, diabetes, or lung disease.  Have a liver disorder.  Are obese.  Have anemia.  What are the signs or symptoms? Symptoms of this condition typically last 4-10 days and may include:  Fever.  Chills.  Headache, body aches, or muscle aches.  Sore throat.  Cough.  Runny or congested nose.  Chest discomfort and cough.  Poor appetite.  Weakness or tiredness (fatigue).  Dizziness.  Nausea or vomiting.  How is this diagnosed? This condition may be diagnosed based on your medical history and a physical exam. Your health care provider may do a nose or throat swab test to confirm the diagnosis. How is this treated? If influenza is detected early, you can be treated with antiviral medicine that can reduce the length of your illness and the severity of your symptoms. This medicine may be given by mouth (orally) or through an IV tube that is inserted in one of your veins. The goal of treatment is to relieve symptoms by taking care  of yourself at home. This may include taking over-the-counter medicines, drinking plenty of fluids, and adding humidity to the air in your home. In some cases, influenza goes away on its own. Severe influenza or complications from influenza may be treated in a hospital. Follow these instructions at home:  Take over-the-counter and prescription medicines only as told by your health care provider.  Use a  cool mist humidifier to add humidity to the air in your home. This can make breathing easier.  Rest as needed.  Drink enough fluid to keep your urine clear or pale yellow.  Cover your mouth and nose when you cough or sneeze.  Wash your hands with soap and water often, especially after you cough or sneeze. If soap and water are not available, use hand sanitizer.  Stay home from work or school as told by your health care provider. Unless you are visiting your health care provider, try to avoid leaving home until your fever has been gone for 24 hours without the use of medicine.  Keep all follow-up visits as told by your health care provider. This is important. How is this prevented?  Getting an annual flu shot is the best way to avoid getting the flu. You may get the flu shot in late summer, fall, or winter. Ask your health care provider when you should get your flu shot.  Wash your hands often or use hand sanitizer often.  Avoid contact with people who are sick during cold and flu season.  Eat a healthy diet, drink plenty of fluids, get enough sleep, and exercise regularly. Contact a health care provider if:  You develop new symptoms.  You have: ? Chest pain. ? Diarrhea. ? A fever.  Your cough gets worse.  You produce more mucus.  You feel nauseous or you vomit. Get help right away if:  You develop shortness of breath or difficulty breathing.  Your skin or nails turn a bluish color.  You have severe pain or stiffness in your neck.  You develop a sudden headache or sudden pain in your face or ear.  You cannot stop vomiting. This information is not intended to replace advice given to you by your health care provider. Make sure you discuss any questions you have with your health care provider. Document Released: 07/27/2000 Document Revised: 01/05/2016 Document Reviewed: 05/24/2015 Elsevier Interactive Patient Education  2017 [employer].  Asthma, Acute  Bronchospasm Acute bronchospasm caused by asthma is also referred to as an asthma attack. Bronchospasm means your air passages become narrowed. The narrowing is caused by inflammation and tightening of the muscles in the air tubes (bronchi) in your lungs. This can make it hard to breathe or cause you to wheeze and cough. What are the causes? Possible triggers are:  Animal dander from the skin, hair, or feathers of animals.  Dust mites contained in house dust.  Cockroaches.  Pollen from trees or grass.  Mold.  Cigarette or tobacco smoke.  Air pollutants such as dust, household cleaners, hair sprays, aerosol sprays, paint fumes, strong chemicals, or strong odors.  Cold air or weather changes. Cold air may trigger inflammation. Winds increase molds and pollens in the air.  Strong emotions such as crying or laughing hard.  Stress.  Certain medicines such as aspirin or beta-blockers.  Sulfites in foods and drinks, such as dried fruits and wine.  Infections or inflammatory conditions, such as a flu, cold, or inflammation of the nasal membranes (rhinitis).  Gastroesophageal reflux disease (GERD). GERD  is a condition where stomach acid backs up into your esophagus.  Exercise or strenuous activity.  What are the signs or symptoms?  Wheezing.  Excessive coughing, particularly at night.  Chest tightness.  Shortness of breath. How is this diagnosed? Your health care provider will ask you about your medical history and perform a physical exam. A chest X-ray or blood testing may be performed to look for other causes of your symptoms or other conditions that may have triggered your asthma attack. How is this treated? Treatment is aimed at reducing inflammation and opening up the airways in your lungs. Most asthma attacks are treated with inhaled medicines. These include quick relief or rescue medicines (such as bronchodilators) and controller medicines (such as inhaled  corticosteroids). These medicines are sometimes given through an inhaler or a nebulizer. Systemic steroid medicine taken by mouth or given through an IV tube also can be used to reduce the inflammation when an attack is moderate or severe. Antibiotic medicines are only used if a bacterial infection is present. Follow these instructions at home:  Rest.  Drink plenty of liquids. This helps the mucus to remain thin and be easily coughed up. Only use caffeine in moderation and do not use alcohol until you have recovered from your illness.  Do not smoke. Avoid being exposed to secondhand smoke.  You play a critical role in keeping yourself in good health. Avoid exposure to things that cause you to wheeze or to have breathing problems.  Keep your medicines up-to-date and available. Carefully follow your health care provider's treatment plan.  Take your medicine exactly as prescribed.  When pollen or pollution is bad, keep windows closed and use an air conditioner or go to places with air conditioning.  Asthma requires careful medical care. See your health care provider for a follow-up as advised. If you are more than [redacted] weeks pregnant and you were prescribed any new medicines, let your obstetrician know about the visit and how you are doing. Follow up with your health care provider as directed.  After you have recovered from your asthma attack, make an appointment with your outpatient doctor to talk about ways to reduce the likelihood of future attacks. If you do not have a doctor who manages your asthma, make an appointment with a primary care doctor to discuss your asthma. Get help right away if:  You are getting worse.  You have trouble breathing. If severe, call your local emergency services (911 in the [place]  You develop chest pain or discomfort.  You are vomiting.  You are not able to keep fluids down.  You are coughing up yellow, green, brown, or bloody sputum.  You have a fever  and your symptoms suddenly get worse.  You have trouble swallowing. This information is not intended to replace advice given to you by your health care provider. Make sure you discuss any questions you have with your health care provider. Document Released: 11/14/2006 Document Revised: 01/11/2016 Document Reviewed: 02/04/2013 Elsevier Interactive Patient Education  2017 [employer].

## 2017-09-11 ENCOUNTER — Encounter: Payer: Self-pay | Admitting: Family Medicine

## 2017-09-11 ENCOUNTER — Ambulatory Visit: Payer: Self-pay | Admitting: Hematology

## 2017-09-11 DIAGNOSIS — I471 Supraventricular tachycardia: Secondary | ICD-10-CM | POA: Insufficient documentation

## 2017-09-11 DIAGNOSIS — I480 Paroxysmal atrial fibrillation: Secondary | ICD-10-CM | POA: Insufficient documentation

## 2017-09-11 MED ORDER — FLUTICASONE PROPIONATE HFA 110 MCG/ACT IN AERO: 2.0000 | INHALATION_SPRAY | Freq: Two times a day (BID) | RESPIRATORY_TRACT | 3 refills | Status: DC

## 2017-09-11 NOTE — Telephone Encounter (Signed)
That was a misunderstanding - I thought she didn't want to start a new inhaler now - wanted to wait until she was back to her baseline, but probably better to start now. Sent new med to [facility].

## 2017-09-11 NOTE — Telephone Encounter (Signed)
Patient saw Dr. Brigitte Pulse on yesterday and discussed her inhalers being changed.  Per patient, Dr. Brigitte Pulse said she didn't need a powder form inhaler.  Patient states there was no inhaler called in to the pharmacy like they discussed. /  Reason for Disposition . Caller has NON-URGENT medication question about med that PCP prescribed and triager unable to answer question  Protocols used: MEDICATION QUESTION CALL-A-AH

## 2017-09-11 NOTE — Telephone Encounter (Signed)
Forwarded message to Dr. Brigitte Pulse re: inhaler meds

## 2017-10-15 ENCOUNTER — Ambulatory Visit (INDEPENDENT_AMBULATORY_CARE_PROVIDER_SITE_OTHER): Payer: BC Managed Care – PPO | Admitting: Women's Health

## 2017-10-15 ENCOUNTER — Encounter: Payer: Self-pay | Admitting: Women's Health

## 2017-10-15 VITALS — BP 124/80 | Ht 66.0 in | Wt 157.0 lb

## 2017-10-15 DIAGNOSIS — Z01419 Encounter for gynecological examination (general) (routine) without abnormal findings: Secondary | ICD-10-CM

## 2017-10-15 DIAGNOSIS — G4709 Other insomnia: Secondary | ICD-10-CM | POA: Diagnosis not present

## 2017-10-15 MED ORDER — ALPRAZOLAM 1 MG PO TABS
ORAL_TABLET | ORAL | 5 refills | Status: DC
Start: 2017-10-15 — End: 2018-05-01

## 2017-10-15 NOTE — Patient Instructions (Addendum)
Vit [employer] 2000   Health Maintenance for Postmenopausal Women Menopause is a normal process in which your reproductive ability comes to an end. This process happens gradually over a span of months to years, usually between the ages of 18 and 77. Menopause is complete when you have missed 12 consecutive menstrual periods. It is important to talk with your health care provider about some of the most common conditions that affect postmenopausal women, such as heart disease, cancer, and bone loss (osteoporosis). Adopting a healthy lifestyle and getting preventive care can help to promote your health and wellness. Those actions can also lower your chances of developing some of these common conditions. What should I know about menopause? During menopause, you may experience a number of symptoms, such as:  Moderate-to-severe hot flashes.  Night sweats.  Decrease in sex drive.  Mood swings.  Headaches.  Tiredness.  Irritability.  Memory problems.  Insomnia.  Choosing to treat or not to treat menopausal changes is an individual decision that you make with your health care provider. What should I know about hormone replacement therapy and supplements? Hormone therapy products are effective for treating symptoms that are associated with menopause, such as hot flashes and night sweats. Hormone replacement carries certain risks, especially as you become older. If you are thinking about using estrogen or estrogen with progestin treatments, discuss the benefits and risks with your health care provider. What should I know about heart disease and stroke? Heart disease, heart attack, and stroke become more likely as you age. This may be due, in part, to the hormonal changes that your body experiences during menopause. These can affect how your body processes dietary fats, triglycerides, and cholesterol. Heart attack and stroke are both medical emergencies. There are many things that you can do to help  prevent heart disease and stroke:  Have your blood pressure checked at least every 1-2 years. High blood pressure causes heart disease and increases the risk of stroke.  If you are 69-38 years old, ask your health care provider if you should take aspirin to prevent a heart attack or a stroke.  Do not use any tobacco products, including cigarettes, chewing tobacco, or electronic cigarettes. If you need help quitting, ask your health care provider.  It is important to eat a healthy diet and maintain a healthy weight. ? Be sure to include plenty of vegetables, fruits, low-fat dairy products, and lean protein. ? Avoid eating foods that are high in solid fats, added sugars, or salt (sodium).  Get regular exercise. This is one of the most important things that you can do for your health. ? Try to exercise for at least 150 minutes each week. The type of exercise that you do should increase your heart rate and make you sweat. This is known as moderate-intensity exercise. ? Try to do strengthening exercises at least twice each week. Do these in addition to the moderate-intensity exercise.  Know your numbers.Ask your health care provider to check your cholesterol and your blood glucose. Continue to have your blood tested as directed by your health care provider.  What should I know about cancer screening? There are several types of cancer. Take the following steps to reduce your risk and to catch any cancer development as early as possible. Breast Cancer  Practice breast self-awareness. ? This means understanding how your breasts normally appear and feel. ? It also means doing regular breast self-exams. Let your health care provider know about any changes, no matter how small.  you are 40 or older, have a clinician do a breast exam (clinical breast exam or CBE) every year. Depending on your age, family history, and medical history, it may be recommended that you also have a yearly breast X-ray  (mammogram).  If you have a family history of breast cancer, talk with your health care provider about genetic screening.  If you are at high risk for breast cancer, talk with your health care provider about having an MRI and a mammogram every year.  Breast cancer (BRCA) gene test is recommended for women who have family members with BRCA-related cancers. Results of the assessment will determine the need for genetic counseling and BRCA1 and for BRCA2 testing. BRCA-related cancers include these types: ? Breast. This occurs in males or females. ? Ovarian. ? Tubal. This may also be called fallopian tube cancer. ? Cancer of the abdominal or pelvic lining (peritoneal cancer). ? Prostate. ? Pancreatic.  Cervical, Uterine, and Ovarian Cancer Your health care provider may recommend that you be screened regularly for cancer of the pelvic organs. These include your ovaries, uterus, and vagina. This screening involves a pelvic exam, which includes checking for microscopic changes to the surface of your cervix (Pap test).  For women ages 21-65, health care providers may recommend a pelvic exam and a Pap test every three years. For women ages 30-65, they may recommend the Pap test and pelvic exam, combined with testing for human papilloma virus (HPV), every five years. Some types of HPV increase your risk of cervical cancer. Testing for HPV may also be done on women of any age who have unclear Pap test results.  Other health care providers may not recommend any screening for nonpregnant women who are considered low risk for pelvic cancer and have no symptoms. Ask your health care provider if a screening pelvic exam is right for you.  If you have had past treatment for cervical cancer or a condition that could lead to cancer, you need Pap tests and screening for cancer for at least 20 years after your treatment. If Pap tests have been discontinued for you, your risk factors (such as having a new sexual  partner) need to be reassessed to determine if you should start having screenings again. Some women have medical problems that increase the chance of getting cervical cancer. In these cases, your health care provider may recommend that you have screening and Pap tests more often.  If you have a family history of uterine cancer or ovarian cancer, talk with your health care provider about genetic screening.  If you have vaginal bleeding after reaching menopause, tell your health care provider.  There are currently no reliable tests available to screen for ovarian cancer.  Lung Cancer Lung cancer screening is recommended for adults 55-80 years old who are at high risk for lung cancer because of a history of smoking. A yearly low-dose CT scan of the lungs is recommended if you:  Currently smoke.  Have a history of at least 30 pack-years of smoking and you currently smoke or have quit within the past 15 years. A pack-year is smoking an average of one pack of cigarettes per day for one year.  Yearly screening should:  Continue until it has been 15 years since you quit.  Stop if you develop a health problem that would prevent you from having lung cancer treatment.  Colorectal Cancer  This type of cancer can be detected and can often be prevented.  Routine colorectal cancer screening   screening usually begins at age 50 and continues through age 75.  If you have risk factors for colon cancer, your health care provider may recommend that you be screened at an earlier age.  If you have a family history of colorectal cancer, talk with your health care provider about genetic screening.  Your health care provider may also recommend using home test kits to check for hidden blood in your stool.  A small camera at the end of a tube can be used to examine your colon directly (sigmoidoscopy or colonoscopy). This is done to check for the earliest forms of colorectal cancer.  Direct examination of the colon  should be repeated every 5-10 years until age 75. However, if early forms of precancerous polyps or small growths are found or if you have a family history or genetic risk for colorectal cancer, you may need to be screened more often.  Skin Cancer  Check your skin from head to toe regularly.  Monitor any moles. Be sure to tell your health care provider: ? About any new moles or changes in moles, especially if there is a change in a mole's shape or color. ? If you have a mole that is larger than the size of a pencil eraser.  If any of your family members has a history of skin cancer, especially at a young age, talk with your health care provider about genetic screening.  Always use sunscreen. Apply sunscreen liberally and repeatedly throughout the day.  Whenever you are outside, protect yourself by wearing long sleeves, pants, a wide-brimmed hat, and sunglasses.  What should I know about osteoporosis? Osteoporosis is a condition in which bone destruction happens more quickly than new bone creation. After menopause, you may be at an increased risk for osteoporosis. To help prevent osteoporosis or the bone fractures that can happen because of osteoporosis, the following is recommended:  If you are 19-50 years old, get at least 1,000 mg of calcium and at least 600 mg of vitamin D per day.  If you are older than age 50 but younger than age 70, get at least 1,200 mg of calcium and at least 600 mg of vitamin D per day.  If you are older than age 70, get at least 1,200 mg of calcium and at least 800 mg of vitamin D per day.  Smoking and excessive alcohol intake increase the risk of osteoporosis. Eat foods that are rich in calcium and vitamin D, and do weight-bearing exercises several times each week as directed by your health care provider. What should I know about how menopause affects my mental health? Depression may occur at any age, but it is more common as you become older. Common symptoms of  depression include:  Low or sad mood.  Changes in sleep patterns.  Changes in appetite or eating patterns.  Feeling an overall lack of motivation or enjoyment of activities that you previously enjoyed.  Frequent crying spells.  Talk with your health care provider if you think that you are experiencing depression. What should I know about immunizations? It is important that you get and maintain your immunizations. These include:  Tetanus, diphtheria, and pertussis (Tdap) booster vaccine.  Influenza every year before the flu season begins.  Pneumonia vaccine.  Shingles vaccine.  Your health care provider may also recommend other immunizations. This information is not intended to replace advice given to you by your health care provider. Make sure you discuss any questions you have with your health care   provider. Document Released: 09/21/2005 Document Revised: 02/17/2016 Document Reviewed: 05/03/2015 Elsevier Interactive Patient Education  2018 [employer].

## 2017-10-15 NOTE — Progress Notes (Signed)
  Alexandra Grant 1961-05-23 [id number]    History:   57 y.o. WMF, G2P2002 presents for annual exam. No complaints except for her numerous brief hot flashes.  Does not want to start any medication for her hot flashes. Normal Pap and mammogram history. 2015 negative colonoscopy. 04/2016 diagnosed with A. fib cardiologist manages. Current on immunizations. No smoking since 04/2016. 11/2016 T score -2.5 at right femoral neck declined all medication or treatment.  Past medical history, past surgical history, family history and social history were all reviewed and documented in the EPIC chart. Works at [employer] job. 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 Mother heart disease.  ROS:  A ROS was performed and pertinent positives and negatives are included.  Exam:  Vitals:   10/15/17 1436  BP: 124/80  Weight: 157 lb (71.2 kg)  Height: 5\' 6"  (1.676 m)   Body mass index is 25.34 kg/m.   General appearance:  Appears healthy with no obvious distress.  Thyroid:  Symmetrical, normal in size, without palpable masses or nodularity. Respiratory  Auscultation:  Clear without wheezing or rhonchi Cardiovascular  Auscultation:  Regular rate, without rubs, murmurs or gallops  Edema/varicosities:  Not grossly evident Abdominal  Soft,nontender, without masses, guarding or rebound.  Liver/spleen:  No organomegaly noted  Hernia:  None appreciated  Skin  Inspection:  Grossly normal   Breasts: Examined lying and sitting.     Right: Without masses, retractions, discharge or axillary adenopathy.     Left: Without masses, retractions, discharge or axillary adenopathy. Gentitourinary   Inguinal/mons:  Normal without inguinal adenopathy  External genitalia:  Normal  BUS/Urethra/Skene's glands:  Normal  Vagina:  Normal  Cervix:  Normal  Uterus:   normal in size, shape and contour.  Midline and mobile  Adnexa/parametria:     Rt: Without  masses or tenderness.   Lt: Without masses or tenderness.  Anus and perineum: Normal  Digital rectal exam: Normal sphincter tone without palpated masses or tenderness  Assessment/Plan:  57 y.o.  M WF G2 P2 for annual exam with no complaint.  Postmenopausal/no HRT/no bleeding A. fib-cardiologist manages labs Osteoporosis declines treatment Insomnia/mild anxiety  Plan: Xanax 1 mg uses half tablet most days for rest, reviewed addictive properties, sleep hygiene reviewed, encouraged to use sparingly, prescription given. 10/2015 normal Pap new screening guidelines reviewed SBEs, continue annual screening mammogram, encouraged regular when necessary and balance exercise, calcium rich foods, vitamin D 2000 daily.. Home safety and fall prevention and fracture prevention discussed.    [place], 3:26 PM 10/15/2017

## 2017-10-17 ENCOUNTER — Other Ambulatory Visit: Payer: Self-pay | Admitting: Internal Medicine

## 2017-12-04 ENCOUNTER — Telehealth: Payer: Self-pay | Admitting: Family Medicine

## 2017-12-04 ENCOUNTER — Encounter: Payer: Self-pay | Admitting: Family Medicine

## 2017-12-04 DIAGNOSIS — J4540 Moderate persistent asthma, uncomplicated: Secondary | ICD-10-CM

## 2017-12-04 MED ORDER — BUDESONIDE-FORMOTEROL FUMARATE 160-4.5 MCG/ACT IN AERO: 2.0000 | INHALATION_SPRAY | Freq: Two times a day (BID) | RESPIRATORY_TRACT | 3 refills | Status: DC

## 2017-12-04 NOTE — Telephone Encounter (Signed)
Copied from [place]. Topic: Quick Communication - Rx Refill/Question >> Dec 04, 2017 10:42 AM Margot Ables wrote: Medication: fluticasone furoate-vilanterol (BREO ELLIPTA) 100-25 MCG/INH AEPB  Pt has gone back to using the [facility] -  she felt the Flovent was not helping her and she was using her rescue inhaler more frequently. Please send in refill on the [facility]. She has 11-12 days left. Has the patient contacted their pharmacy? no Preferred Pharmacy (with phone number or street name): [place], [place] - [address] [phone] (Phone) [phone] (Fax)

## 2017-12-04 NOTE — Telephone Encounter (Signed)
Notified pt by MyChart.  Sent in Symbicort instead - flovent likely not strong enough since it is a ICS alone - likely needs a LABA/ICS combo. At last visit pt felt like the dry powder of the DPI formulation of Breo was causing increased cough - could feel it settling in lungs - so suspect that Symbicort which is a similar strength med to [employer] (flovent was weaker) but is a aerosol rather than a dry powder os suspect it will work better for her than either breo or flovent - if not, happy to switch back to breo. Also go to websit to get coupon for 0$ copay.

## 2017-12-10 NOTE — Telephone Encounter (Signed)
Sounds good - hopefully she is ok w/ trying the symbicort that is already on file with her pharmacy - esp since she can try it for free. If she just wants to stay on the breo, she probably still has refills on her old rx at pharm or I am happy to send her in a new rx - just let me know.

## 2017-12-10 NOTE — Telephone Encounter (Addendum)
Pt states she has had to use her rescue inhaler multiple times with the flovent   Pt states she had a small amount of the Breo inhaler left and used, did not have to use the rescue inhaler with it at all!!  Pt would like to switch back to the [place], [place] [address] [phone] (Phone) [phone] (Fax)   I called the pt back to advise Dr Brigitte Pulse had sent her a [employer].  Did not see this note until after call from pt. Pt verbalized understanding and will read Dr Brigitte Pulse message.Marland Kitchen

## 2017-12-17 ENCOUNTER — Other Ambulatory Visit: Payer: Self-pay | Admitting: Physician Assistant

## 2018-01-17 ENCOUNTER — Other Ambulatory Visit: Payer: Self-pay | Admitting: Women's Health

## 2018-01-17 DIAGNOSIS — G4709 Other insomnia: Secondary | ICD-10-CM

## 2018-04-15 ENCOUNTER — Other Ambulatory Visit: Payer: Self-pay | Admitting: Family Medicine

## 2018-04-16 NOTE — Telephone Encounter (Signed)
Symbicort 160-4.5 refill Last Refill:03/17/18 # 1 Last OV: 09/09/17 PCP: Brigitte Pulse [facility]

## 2018-05-01 ENCOUNTER — Other Ambulatory Visit: Payer: Self-pay | Admitting: Women's Health

## 2018-05-01 DIAGNOSIS — G4709 Other insomnia: Secondary | ICD-10-CM

## 2018-05-01 NOTE — Telephone Encounter (Signed)
Ok for refill? 

## 2018-05-01 NOTE — Telephone Encounter (Signed)
Called into pharmacy

## 2018-05-27 ENCOUNTER — Encounter: Payer: Self-pay | Admitting: Physician Assistant

## 2018-05-27 ENCOUNTER — Ambulatory Visit: Payer: BC Managed Care – PPO | Admitting: Physician Assistant

## 2018-05-27 VITALS — BP 113/74 | HR 61 | Temp 98.4°F | Resp 17 | Ht 66.0 in | Wt 159.0 lb

## 2018-05-27 DIAGNOSIS — M5432 Sciatica, left side: Secondary | ICD-10-CM

## 2018-05-27 NOTE — Patient Instructions (Addendum)
Start stretching daily. Use mobic daily for one week. Continue walking. Return to clinic if symptoms worsen, do not improve, or as needed.    If you have lab work done today you will be contacted with your lab results within the next 2 weeks.  If you have not heard from [place] then please contact us. The fastest way to get your results is to register for My Chart.  Sciatica Rehab Ask your health care provider which exercises are safe for you. Do exercises exactly as told by your health care provider and adjust them as directed. It is normal to feel mild stretching, pulling, tightness, or discomfort as you do these exercises, but you should stop right away if you feel sudden pain or your pain gets worse.Do not begin these exercises until told by your health care provider. Stretching and range of motion exercises These exercises warm up your muscles and joints and improve the movement and flexibility of your hips and your back. These exercises also help to relieve pain, numbness, and tingling. Exercise A: Sciatic nerve glide 1. Sit in a chair with your head facing down toward your chest. Place your hands behind your back. Let your shoulders slump forward. 2. Slowly straighten one of your knees while you tilt your head back as if you are looking toward the ceiling. Only straighten your leg as far as you can without making your symptoms worse. 3. Hold for __________ seconds. 4. Slowly return to the starting position. 5. Repeat with your other leg. Repeat __________ times. Complete this exercise __________ times a day. Exercise B: Knee to chest with hip adduction and internal rotation  1. Lie on your back on a firm surface with both legs straight. 2. Bend one of your knees and move it up toward your chest until you feel a gentle stretch in your lower back and buttock. Then, move your knee toward the shoulder that is on the opposite side from your leg. ? Hold your leg in this position by holding onto  the front of your knee. 3. Hold for __________ seconds. 4. Slowly return to the starting position. 5. Repeat with your other leg. Repeat __________ times. Complete this exercise __________ times a day. Exercise C: Prone extension on elbows  1. Lie on your abdomen on a firm surface. A bed may be too soft for this exercise. 2. Prop yourself up on your elbows. 3. Use your arms to help lift your chest up until you feel a gentle stretch in your abdomen and your lower back. ? This will place some of your body weight on your elbows. If this is uncomfortable, try stacking pillows under your chest. ? Your hips should stay down, against the surface that you are lying on. Keep your hip and back muscles relaxed. 4. Hold for __________ seconds. 5. Slowly relax your upper body and return to the starting position. Repeat __________ times. Complete this exercise __________ times a day. Strengthening exercises These exercises build strength and endurance in your back. Endurance is the ability to use your muscles for a long time, even after they get tired. Exercise D: Pelvic tilt 1. Lie on your back on a firm surface. Bend your knees and keep your feet flat. 2. Tense your abdominal muscles. Tip your pelvis up toward the ceiling and flatten your lower back into the floor. ? To help with this exercise, you may place a small towel under your lower back and try to push your back into the towel.  3. Hold for __________ seconds. 4. Let your muscles relax completely before you repeat this exercise. Repeat __________ times. Complete this exercise __________ times a day. Exercise E: Alternating arm and leg raises  1. Get on your hands and knees on a firm surface. If you are on a hard floor, you may want to use padding to cushion your knees, such as an exercise mat. 2. Line up your arms and legs. Your hands should be below your shoulders, and your knees should be below your hips. 3. Lift your left leg behind you. At  the same time, raise your right arm and straighten it in front of you. ? Do not lift your leg higher than your hip. ? Do not lift your arm higher than your shoulder. ? Keep your abdominal and back muscles tight. ? Keep your hips facing the ground. ? Do not arch your back. ? Keep your balance carefully, and do not hold your breath. 4. Hold for __________ seconds. 5. Slowly return to the starting position and repeat with your right leg and your left arm. Repeat __________ times. Complete this exercise __________ times a day. Posture and body mechanics  Body mechanics refers to the movements and positions of your body while you do your daily activities. Posture is part of body mechanics. Good posture and healthy body mechanics can help to relieve stress in your body's tissues and joints. Good posture means that your spine is in its natural S-curve position (your spine is neutral), your shoulders are pulled back slightly, and your head is not tipped forward. The following are general guidelines for applying improved posture and body mechanics to your everyday activities. Standing   When standing, keep your spine neutral and your feet about hip-width apart. Keep a slight bend in your knees. Your ears, shoulders, and hips should line up.  When you do a task in which you stand in one place for a long time, place one foot up on a stable object that is 2-4 inches (5-10 cm) high, such as a footstool. This helps keep your spine neutral. Sitting   When sitting, keep your spine neutral and keep your feet flat on the floor. Use a footrest, if necessary, and keep your thighs parallel to the floor. Avoid rounding your shoulders, and avoid tilting your head forward.  When working at a desk or a computer, keep your desk at a height where your hands are slightly lower than your elbows. Slide your chair under your desk so you are close enough to maintain good posture.  When working at a computer, place your  monitor at a height where you are looking straight ahead and you do not have to tilt your head forward or downward to look at the screen. Resting   When lying down and resting, avoid positions that are most painful for you.  If you have pain with activities such as sitting, bending, stooping, or squatting (flexion-based activities), lie in a position in which your body does not bend very much. For example, avoid curling up on your side with your arms and knees near your chest (fetal position).  If you have pain with activities such as standing for a long time or reaching with your arms (extension-based activities), lie with your spine in a neutral position and bend your knees slightly. Try the following positions: ? Lying on your side with a pillow between your knees. ? Lying on your back with a pillow under your knees. Lifting   When  lifting objects, keep your feet at least shoulder-width apart and tighten your abdominal muscles.  Bend your knees and hips and keep your spine neutral. It is important to lift using the strength of your legs, not your back. Do not lock your knees straight out.  Always ask for help to lift heavy or awkward objects. This information is not intended to replace advice given to you by your health care provider. Make sure you discuss any questions you have with your health care provider. Document Released: 07/30/2005 Document Revised: 04/05/2016 Document Reviewed: 04/15/2015 Elsevier Interactive Patient Education  2018 [employer].   IF you received an x-ray today, you will receive an invoice from [facility]. Please contact [facility] at [phone] with questions or concerns regarding your invoice.   IF you received labwork today, you will receive an invoice from [facility]. Please contact [facility] at [phone] with questions or concerns regarding your invoice.   Our billing staff will not be able to assist you with questions regarding  bills from these companies.  You will be contacted with the lab results as soon as they are available. The fastest way to get your results is to activate your My Chart account. Instructions are located on the last page of this paperwork. If you have not heard from [place] regarding the results in 2 weeks, please contact this office.

## 2018-05-27 NOTE — Progress Notes (Signed)
Alexandra Grant  MRN: [id number] DOB: Nov 24, 1960  Subjective:  Alexandra Grant is a 57 y.o. female seen in office today for a chief complaint of left posterior thigh pain and buttocks pain x couple of years. She sits all day at work.    Onset was related to / precipitated by no known injury.  The pain is described as burning and uncomfortable and occurs only when sitting. Symptoms are exacerbated by sitting. Symptoms are improved by walking and standing. Has tried meloxicam intermittently with no full relief. She does not like medications. She has weakness in the right leg, weakness in the left leg, tingling in the right leg, tingling in the left leg, burning pain in the right leg, urinary hesitancy, urinary incontinence, urinary retention, bowel incontinence, constipation, impotence and groin/perineal numbness associated with the back pain.   Review of Systems  Constitutional: Negative for chills, diaphoresis and fever.  Genitourinary: Negative for decreased urine volume, dysuria, frequency and urgency.    Patient Active Problem List   Diagnosis Date Noted  . Paroxysmal supraventricular tachycardia ([place]) 09/11/2017  . Paroxysmal atrial fibrillation ([place]) 09/11/2017  . Asthma in adult, moderate persistent, uncomplicated 54/56/2563  . Osteoporosis without current pathological fracture 12/24/2016  . 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 DUB (dysfunctional uterine bleeding) 04/27/2013    Current Outpatient Medications on File Prior to Visit  Medication Sig Dispense Refill  . Albuterol Sulfate (PROAIR RESPICLICK) 893 (90 Base) MCG/ACT AEPB Inhale 2 puffs into the lungs every 4 (four) hours as needed. 1 each 11  . ALPRAZolam (XANAX) 1 MG tablet TAKE 1 TABLET BY MOUTH AT BEDTIME AS NEEDED FOR ANXIETY 30 tablet 1  . CARTIA XT 120 MG 24 hr capsule TAKE 1 CAPSULE BY MOUTH ONCE DAILY. 90 capsule 1  . diltiazem (CARDIZEM) 30 MG tablet Take 1 tablet (30 mg total) by mouth 3 (three) times daily as  needed. At onset of atrial fibrillation/fast heart rate 30 tablet 3  . EQ ASPIRIN ADULT LOW DOSE 81 MG EC tablet TAKE 1 TABLET BY MOUTH ONCE DAILY 90 tablet 3  . ibuprofen (ADVIL,MOTRIN) 200 MG tablet Take 800 mg by mouth every 6 (six) hours as needed for headache or moderate pain.    . meloxicam (MOBIC) 7.5 MG tablet Take 1 tablet (7.5 mg total) by mouth daily. 30 tablet 1  . OVER THE COUNTER MEDICATION Stool softner daily    . SYMBICORT 160-4.5 MCG/ACT inhaler INHALE 2 PUFFS BY MOUTH TWICE DAILY 1 Inhaler 1  . Cholecalciferol 1000 units capsule Take 1,000 Units by mouth daily.     No current facility-administered medications on file prior to visit.     No Known Allergies   Objective:  BP 113/74   Pulse 61   Temp 98.4 F (36.9 C) (Oral)   Resp 17   Ht 5\' 6"  (1.676 m)   Wt 159 lb (72.1 kg)   SpO2 98%   BMI 25.66 kg/m   Physical Exam  Constitutional: She is oriented to person, place, and time. She appears well-developed and well-nourished. No distress.  HENT:  Head: Normocephalic and atraumatic.  Eyes: Conjunctivae are normal.  Neck: Normal range of motion.  Cardiovascular:  Pulses:      Posterior tibial pulses are 2+ on the right side, and 2+ on the left side.  Pulmonary/Chest: Effort normal.  Musculoskeletal:       Right hip: Normal.       Left hip: Normal.  Thoracic back: Normal.       Lumbar back: She exhibits normal range of motion, no tenderness, no bony tenderness and no spasm.       Right upper leg: Normal.       Left upper leg: Normal.  Neurological: She is alert and oriented to person, place, and time. Gait normal.  Reflex Scores:      Patellar reflexes are 2+ on the right side and 2+ on the left side.      Achilles reflexes are 2+ on the right side and 2+ on the left side. Sensation of BLE intact.  Strength of BLE 5/5.  Neg SLR b/l  Skin: Skin is warm and dry.  Psychiatric: She has a normal mood and affect.  Vitals reviewed.   Assessment and Plan :   1. Sciatica of left side Hx suggestive of sciatic nerve inflammation. No acute findings noted on exam.  Pain notable after sitting for long periods at work.  She is not interested in medications at this time.  Do suggest daily meloxicam for at least 1 week, as she has a prescription for this at home. Further recommend daily stretches.  Given educational material for sciatica rehab exercises.  May use heating pad/ice pack to affected area.  Also suggested taking frequent breaks from sitting for long periods of time at work.  Continue walking. Avoid heavy lifting until pain improves. Advised to return to clinic if symptoms worsen, do not improve, or as needed.  Can consider physical therapy at that time if no improvement.  Tenna Delaine PA-C  [facility] 05/27/2018 1:56 PM

## 2018-05-28 ENCOUNTER — Other Ambulatory Visit: Payer: Self-pay | Admitting: Family Medicine

## 2018-05-28 DIAGNOSIS — M545 Low back pain, unspecified: Secondary | ICD-10-CM

## 2018-05-28 DIAGNOSIS — G8929 Other chronic pain: Secondary | ICD-10-CM

## 2018-05-28 MED ORDER — MELOXICAM 7.5 MG PO TABS: 7.5000 mg | ORAL_TABLET | Freq: Every day | ORAL | 1 refills | Status: DC

## 2018-05-28 NOTE — Telephone Encounter (Signed)
Copied from [place] [phone]. Topic: Quick Communication - Rx Refill/Question >> May 28, 2018 10:43 AM Oliver Pila B wrote: Medication: meloxicam   Has the patient contacted their pharmacy? Yes.   (Agent: If no, request that the patient contact the pharmacy for the refill.) (Agent: If yes, when and what did the pharmacy advise?)  [facility]erred Pharm[facility] (with phone number [facility]   Agent: Please be advised that RX refills may take up to 3 business days. We ask that you follow-up with your pharmacy.

## 2018-05-28 NOTE — Telephone Encounter (Signed)
Requested Prescriptions  Pending Prescriptions Disp Refills  . meloxicam (MOBIC) 7.5 MG tablet 30 tablet 1    Sig: Take 1 tablet (7.5 mg total) by mouth daily.     Analgesics:  COX2 Inhibitors Failed - 05/28/2018 10:48 AM      Failed - Cr in normal range and within 360 days    Creat  Date Value Ref Range Status  12/25/2016 0.86 0.50 - 1.05 mg/dL Final    Comment:      For patients > or = 56 years of age: The upper reference limit for Creatinine is approximately 13% higher for people identified as African-American.            Passed - HGB in normal range and within 360 days    Hemoglobin  Date Value Ref Range Status  07/08/2017 14.5 12.2 - 16.2 g/dL Final  12/25/2016 14.4 11.7 - 15.5 g/dL Final         Passed - Patient is not pregnant      Passed - Valid encounter within last 12 months    Recent Outpatient Visits          Yesterday Sciatica of left side   [facility] [place], [place] D, PA-C   8 months ago Fever, unspecified   [facility], Laurey Arrow, MD   10 months ago Wheezing   [facility] [place], [place] D, PA-C   11 months ago Mild persistent reactive airway disease without complication   [facility], Laurey Arrow, MD   2 years ago Asthma, mild intermittent, uncomplicated   [facility] at [facility], Laurey Arrow, MD

## 2018-07-02 ENCOUNTER — Other Ambulatory Visit: Payer: Self-pay | Admitting: Family Medicine

## 2018-09-06 ENCOUNTER — Other Ambulatory Visit: Payer: Self-pay | Admitting: Physician Assistant

## 2018-09-11 ENCOUNTER — Other Ambulatory Visit: Payer: Self-pay | Admitting: Women's Health

## 2018-09-11 DIAGNOSIS — G4709 Other insomnia: Secondary | ICD-10-CM

## 2018-09-12 NOTE — Telephone Encounter (Signed)
Pl call and review best not to take daily

## 2018-09-15 ENCOUNTER — Other Ambulatory Visit: Payer: Self-pay | Admitting: Women's Health

## 2018-09-15 DIAGNOSIS — G4709 Other insomnia: Secondary | ICD-10-CM

## 2018-09-15 NOTE — Telephone Encounter (Signed)
Called into pharmacy

## 2018-09-15 NOTE — Telephone Encounter (Signed)
Spoke with patient and advised her.  Transferred to appt desk to schedule her CE in March.

## 2018-10-12 ENCOUNTER — Other Ambulatory Visit: Payer: Self-pay | Admitting: Physician Assistant

## 2018-10-13 ENCOUNTER — Other Ambulatory Visit: Payer: Self-pay

## 2018-10-13 ENCOUNTER — Other Ambulatory Visit: Payer: Self-pay | Admitting: Internal Medicine

## 2018-10-13 MED ORDER — DILTIAZEM HCL ER COATED BEADS 120 MG PO CP24: 120.0000 mg | ORAL_CAPSULE | Freq: Every day | ORAL | 0 refills | Status: DC

## 2018-10-13 NOTE — Telephone Encounter (Signed)
Opened in error

## 2018-10-13 NOTE — Telephone Encounter (Signed)
Pt's medication was sent to pt's pharmacy as requested. Confirmation received.  °

## 2018-10-20 ENCOUNTER — Encounter: Payer: BC Managed Care – PPO | Admitting: Women's Health

## 2018-10-27 ENCOUNTER — Other Ambulatory Visit: Payer: Self-pay | Admitting: Internal Medicine

## 2018-10-29 ENCOUNTER — Telehealth: Payer: Self-pay

## 2018-10-29 MED ORDER — DILTIAZEM HCL ER COATED BEADS 120 MG PO CP24: 120.0000 mg | ORAL_CAPSULE | Freq: Every day | ORAL | 0 refills | Status: DC

## 2018-10-29 NOTE — Telephone Encounter (Signed)
Call placed to Pt.  Advised at this time non urgent office visits were being cancelled d/t Covid-19 concerns.  Advised OV has been cancelled and Pt would receive call in the near future to reschedule.  Refilled Pt's Cartia XT for 90 days.  Advised to call office if any needs.

## 2018-11-05 ENCOUNTER — Ambulatory Visit: Payer: BC Managed Care – PPO | Admitting: Internal Medicine

## 2018-11-13 ENCOUNTER — Encounter: Payer: Self-pay | Admitting: Interventional Cardiology

## 2018-11-14 ENCOUNTER — Telehealth (INDEPENDENT_AMBULATORY_CARE_PROVIDER_SITE_OTHER): Payer: BC Managed Care – PPO | Admitting: Internal Medicine

## 2018-11-14 ENCOUNTER — Other Ambulatory Visit: Payer: Self-pay

## 2018-11-14 DIAGNOSIS — I480 Paroxysmal atrial fibrillation: Secondary | ICD-10-CM

## 2018-11-14 DIAGNOSIS — I471 Supraventricular tachycardia: Secondary | ICD-10-CM

## 2018-11-14 NOTE — Progress Notes (Signed)
Electrophysiology TeleHealth Note   Due to national recommendations of social distancing due to COVID 19, an audio/video telehealth visit is felt to be most appropriate for this patient at this time.  See MyChart message from today for the patient's consent to telehealth for [facility].   Date:  11/14/2018   ID:  Alexandra Grant, DOB 02/28/1961, MRN [id number]  Location: patient's home  Provider location: [address], [place]  Evaluation Performed: Follow-up visit  PCP:  Shawnee Knapp, MD  Cardiologist:  No primary care provider on file.  Electrophysiologist:  Dr Lovena Le  Chief Complaint:  "I've been feeling ok"  History of Present Illness:    Alexandra Grant is a 58 y.o. female who presents via audio/video conferencing for a telehealth visit today. She is an otherwise healthy woman with remote SVT s/p ablation of AVNRT who had a recurrence of SVT over 10 years after her ablation. I saw her a couple of years ago and  Since then she has done well with no recurrent symptomatic SVT. She notes a febrile GI illness about 4 weeks ago. No cough or sob. It lasted over a week. Today, she denies symptoms of palpitations, chest pain, shortness of breath,  lower extremity edema, dizziness, presyncope, or syncope.  The patient is otherwise without complaint today.  The patient denies symptoms of fevers, chills, cough, or new SOB worrisome for COVID 19.  Past Medical History:  Diagnosis Date  . Asthma    adult onset, confirmed with pre- and post-bronchodialator PFTs 06/13/2017 at PCP.  Marland Kitchen Menorrhagia 2014  . Osteoporosis   . Paroxysmal atrial fibrillation (HCC)   . Paroxysmal SVT (supraventricular tachycardia) (HCC)     No past surgical history on file.  Current Outpatient Medications  Medication Sig Dispense Refill  . Albuterol Sulfate (PROAIR RESPICLICK) 494 (90 Base) MCG/ACT AEPB Inhale 2 puffs into the lungs every 4 (four) hours as needed. 1 each 11  . ALPRAZolam (XANAX) 1  MG tablet TAKE 1 TABLET BY MOUTH AT BEDTIME AS NEEDED FOR ANXIETY 30 tablet 1  . Cholecalciferol 1000 units capsule Take 1,000 Units by mouth daily.    Marland Kitchen diltiazem (CARDIZEM) 30 MG tablet Take 1 tablet (30 mg total) by mouth 3 (three) times daily as needed. At onset of atrial fibrillation/fast heart rate 30 tablet 3  . diltiazem (CARTIA XT) 120 MG 24 hr capsule Take 1 capsule (120 mg total) by mouth daily. 90 capsule 0  . EQ ASPIRIN ADULT LOW DOSE 81 MG EC tablet TAKE 1 TABLET BY MOUTH ONCE DAILY 90 tablet 3  . ibuprofen (ADVIL,MOTRIN) 200 MG tablet Take 800 mg by mouth every 6 (six) hours as needed for headache or moderate pain.    . meloxicam (MOBIC) 7.5 MG tablet Take 1 tablet (7.5 mg total) by mouth daily. 30 tablet 1  . OVER THE COUNTER MEDICATION Stool softner daily    . SYMBICORT 160-4.5 MCG/ACT inhaler INHALE 2 PUFFS BY MOUTH TWICE DAILY 33 Inhaler 1   No current facility-administered medications for this visit.     Allergies:   Patient has no known allergies.   Social History:  The patient  reports that she has quit smoking. Her smoking use included cigarettes. She quit after 20.00 years of use. She has never used smokeless tobacco. She reports that she does not drink alcohol or use drugs.   Family History:  The patient's family history includes Diabetes in her maternal aunt; Heart disease  in her maternal grandmother and mother; Hyperlipidemia in her mother; Hypertension in her mother.   ROS:  Please see the history of present illness.   All other systems are personally reviewed and negative.    Exam:    Vital Signs:  There were no vitals taken for this visit.  Well appearing, alert and conversant, regular work of breathing,  good skin color Eyes- anicteric, neuro- grossly intact,  Neck - no JVD   Labs/Other Tests and Data Reviewed:    Recent Labs: No results found for requested labs within last 8760 hours.   Wt Readings from Last 3 Encounters:  05/27/18 159 lb (72.1 kg)   10/15/17 157 lb (71.2 kg)  09/09/17 152 lb 9.6 oz (69.2 kg)     Other studies personally reviewed:   ASSESSMENT & PLAN:    1.  SVT - she is asymptomatic and appears to be tolerating her meds nicely. I encouraged her to avoid caffeine and ETOH.  2. Atrial fib - she has had a single episode documented after breaking from SVT. She denies symptoms. She will undergo watchful waiting. 3. COVID 19 screen The patient denies symptoms of COVID 19 at this time.  The importance of social distancing was discussed today.  Follow-up:  6 months  Current medicines are reviewed at length with the patient today.   The patient does not have concerns regarding her medicines.  The following changes were made today:  none  Labs/ tests ordered today include: none No orders of the defined types were placed in this encounter.    Patient Risk:  after full review of this patients clinical status, I feel that they are at moderate risk at this time.  Today, I have spent 15 minutes with the patient with telehealth technology discussing the above problems. Loma Messing, MD  11/14/2018 10:50 AM     [facility] [address] [place] [address] [phone] (office) [phone] (fax)

## 2018-11-19 ENCOUNTER — Encounter: Payer: BC Managed Care – PPO | Admitting: Women's Health

## 2019-01-08 ENCOUNTER — Telehealth (INDEPENDENT_AMBULATORY_CARE_PROVIDER_SITE_OTHER): Payer: BC Managed Care – PPO | Admitting: Family Medicine

## 2019-01-08 ENCOUNTER — Other Ambulatory Visit: Payer: Self-pay

## 2019-01-08 VITALS — Ht 66.0 in | Wt 160.0 lb

## 2019-01-08 DIAGNOSIS — J4530 Mild persistent asthma, uncomplicated: Secondary | ICD-10-CM

## 2019-01-08 MED ORDER — BUDESONIDE-FORMOTEROL FUMARATE 160-4.5 MCG/ACT IN AERO: 2.0000 | INHALATION_SPRAY | Freq: Two times a day (BID) | RESPIRATORY_TRACT | 3 refills | Status: DC

## 2019-01-08 MED ORDER — ALBUTEROL SULFATE 108 (90 BASE) MCG/ACT IN AEPB: 2.0000 | INHALATION_SPRAY | RESPIRATORY_TRACT | 3 refills | Status: DC | PRN

## 2019-01-08 NOTE — Progress Notes (Signed)
Patient states she needs a refill on Symbicort inhaler.

## 2019-01-08 NOTE — Progress Notes (Signed)
Telemedicine Encounter- SOAP NOTE Established Patient  This telephone encounter was conducted with the patient's (or proxy's) verbal consent via audio telecommunications: yes/no: Yes Patient was instructed to have this encounter in a suitably private space; and to only have persons present to whom they give permission to participate. In addition, patient identity was confirmed by use of name plus two identifiers (DOB and address).  I discussed the limitations, risks, security and privacy concerns of performing an evaluation and management service by telephone and the availability of in person appointments. I also discussed with the patient that there may be a patient responsible charge related to this service. The patient expressed understanding and agreed to proceed.  I spent a total of TIME; 0 MIN TO 60 MIN: 15 minutes talking with the patient or their proxy.  No chief complaint on file.   Subjective   Alexandra Grant is a 58 y.o. established patient. Telephone visit today for  HPI  She states that her asthma was well controlled until she ran out of her symbicort She states that she ran out of her symbicort and albuterol She was coughing at night and it bothered her greatly. She states that her trigger are grass and pollen or menopause. She is a former smoker  She has not had any ER visits due to asthma  Patient Active Problem List   Diagnosis Date Noted  . Paroxysmal supraventricular tachycardia ([place]) 09/11/2017  . Paroxysmal atrial fibrillation ([place]) 09/11/2017  . Asthma in adult, moderate persistent, uncomplicated 78/46/9629  . Osteoporosis without current pathological fracture 12/24/2016  . 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 DUB (dysfunctional uterine bleeding) 04/27/2013    Past Medical History:  Diagnosis Date  . Asthma    adult onset, confirmed with pre- and post-bronchodialator PFTs 06/13/2017 at PCP.  Marland Kitchen Menorrhagia 2014  . Osteoporosis   . Paroxysmal atrial  fibrillation (HCC)   . Paroxysmal SVT (supraventricular tachycardia) (HCC)     Current Outpatient Medications  Medication Sig Dispense Refill  . Albuterol Sulfate (PROAIR RESPICLICK) 528 (90 Base) MCG/ACT AEPB Inhale 2 puffs into the lungs every 4 (four) hours as needed. 1 each 3  . ALPRAZolam (XANAX) 1 MG tablet TAKE 1 TABLET BY MOUTH AT BEDTIME AS NEEDED FOR ANXIETY 30 tablet 1  . budesonide-formoterol (SYMBICORT) 160-4.5 MCG/ACT inhaler Inhale 2 puffs into the lungs 2 (two) times daily. 1 Inhaler 3  . Cholecalciferol 1000 units capsule Take 1,000 Units by mouth daily.    Marland Kitchen diltiazem (CARDIZEM) 30 MG tablet Take 1 tablet (30 mg total) by mouth 3 (three) times daily as needed. At onset of atrial fibrillation/fast heart rate 30 tablet 3  . diltiazem (CARTIA XT) 120 MG 24 hr capsule Take 1 capsule (120 mg total) by mouth daily. 90 capsule 0  . EQ ASPIRIN ADULT LOW DOSE 81 MG EC tablet TAKE 1 TABLET BY MOUTH ONCE DAILY 90 tablet 3  . ibuprofen (ADVIL,MOTRIN) 200 MG tablet Take 800 mg by mouth every 6 (six) hours as needed for headache or moderate pain.    . meloxicam (MOBIC) 7.5 MG tablet Take 1 tablet (7.5 mg total) by mouth daily. 30 tablet 1  . OVER THE COUNTER MEDICATION Stool softner daily     No current facility-administered medications for this visit.     No Known Allergies  Social History   Socioeconomic History  . Marital status: Married    Spouse name: Not on file  . Number of children: Not on file  .  Years of education: Not on file  . Highest education level: Not on file  Occupational History  . Not on file  Social Needs  . Financial resource strain: Not on file  . Food insecurity:    Worry: Not on file    Inability: Not on file  . Transportation needs:    Medical: Not on file    Non-medical: Not on file  Tobacco Use  . Smoking status: Former Smoker    Years: 20.00    Types: Cigarettes  . Smokeless tobacco: Never Used  . Tobacco comment: QUIT 2015  Substance and  Sexual Activity  . Alcohol use: No    Alcohol/week: 14.0 standard drinks    Types: 14 Standard drinks or equivalent per week    Frequency: Never  . Drug use: No  . Sexual activity: Yes    Partners: Male    Birth control/protection: Other-see comments    Comment: vasectomy, INTERCOURSE AGE 17SEXUAL PARTNERS LESS THAN 5  Lifestyle  . Physical activity:    Days per week: Not on file    Minutes per session: Not on file  . Stress: Not on file  Relationships  . Social connections:    Talks on phone: Not on file    Gets together: Not on file    Attends religious service: Not on file    Active member of club or organization: Not on file    Attends meetings of clubs or organizations: Not on file    Relationship status: Not on file  . Intimate partner violence:    Fear of current or ex partner: Not on file    Emotionally abused: Not on file    Physically abused: Not on file    Forced sexual activity: Not on file  Other Topics Concern  . Not on file  Social History Narrative  . Not on file    ROS  Objective   Vitals as reported by the patient: Today's Vitals   01/08/19 1401  Weight: 160 lb (72.6 kg)  Height: 5\' 6"  (1.676 m)   Normal pulmonary effort   Diagnoses and all orders for this visit:  Mild persistent asthma without complication  Other orders -     budesonide-formoterol (SYMBICORT) 160-4.5 MCG/ACT inhaler; Inhale 2 puffs into the lungs 2 (two) times daily. -     Albuterol Sulfate (PROAIR RESPICLICK) 121 (90 Base) MCG/ACT AEPB; Inhale 2 puffs into the lungs every 4 (four) hours as needed.   Refilled asthma medication Gave precautions for COVID Discussed asthma exacerbation  I discussed the assessment and treatment plan with the patient. The patient was provided an opportunity to ask questions and all were answered. The patient agreed with the plan and demonstrated an understanding of the instructions.   The patient was advised to call back or seek an in-person  evaluation if the symptoms worsen or if the condition fails to improve as anticipated.  I provided 15 minutes of non-face-to-face time during this encounter.  Forrest Moron, MD  Primary Care at [facility]

## 2019-01-10 ENCOUNTER — Other Ambulatory Visit: Payer: Self-pay | Admitting: Internal Medicine

## 2019-02-03 ENCOUNTER — Other Ambulatory Visit: Payer: Self-pay | Admitting: Internal Medicine

## 2019-05-09 ENCOUNTER — Other Ambulatory Visit: Payer: Self-pay | Admitting: Family Medicine

## 2019-05-12 ENCOUNTER — Encounter: Payer: Self-pay | Admitting: Gynecology

## 2019-06-16 ENCOUNTER — Other Ambulatory Visit: Payer: Self-pay | Admitting: Family Medicine

## 2019-07-13 DIAGNOSIS — F419 Anxiety disorder, unspecified: Secondary | ICD-10-CM | POA: Insufficient documentation

## 2019-07-13 DIAGNOSIS — E559 Vitamin D deficiency, unspecified: Secondary | ICD-10-CM | POA: Insufficient documentation

## 2019-07-13 DIAGNOSIS — N951 Menopausal and female climacteric states: Secondary | ICD-10-CM | POA: Insufficient documentation

## 2019-07-24 ENCOUNTER — Other Ambulatory Visit: Payer: Self-pay | Admitting: Family Medicine

## 2019-07-24 LAB — HM PAP SMEAR

## 2019-07-30 DIAGNOSIS — Z8262 Family history of osteoporosis: Secondary | ICD-10-CM | POA: Insufficient documentation

## 2019-07-30 DIAGNOSIS — R638 Other symptoms and signs concerning food and fluid intake: Secondary | ICD-10-CM | POA: Insufficient documentation

## 2019-08-27 ENCOUNTER — Other Ambulatory Visit: Payer: Self-pay | Admitting: Family Medicine

## 2019-09-22 ENCOUNTER — Encounter: Payer: Self-pay | Admitting: Family Medicine

## 2019-10-16 ENCOUNTER — Ambulatory Visit: Payer: BC Managed Care – PPO | Admitting: Internal Medicine

## 2019-10-19 IMAGING — DX DG CHEST 2V
3 series · 3 of 3 positions shown · non-contrast
Comparison: 04/23/2016

CLINICAL DATA: Shortness of breath. History of asthma and smoking
history.

EXAM:
CHEST  2 VIEW

[chest pa (1 of 2)]
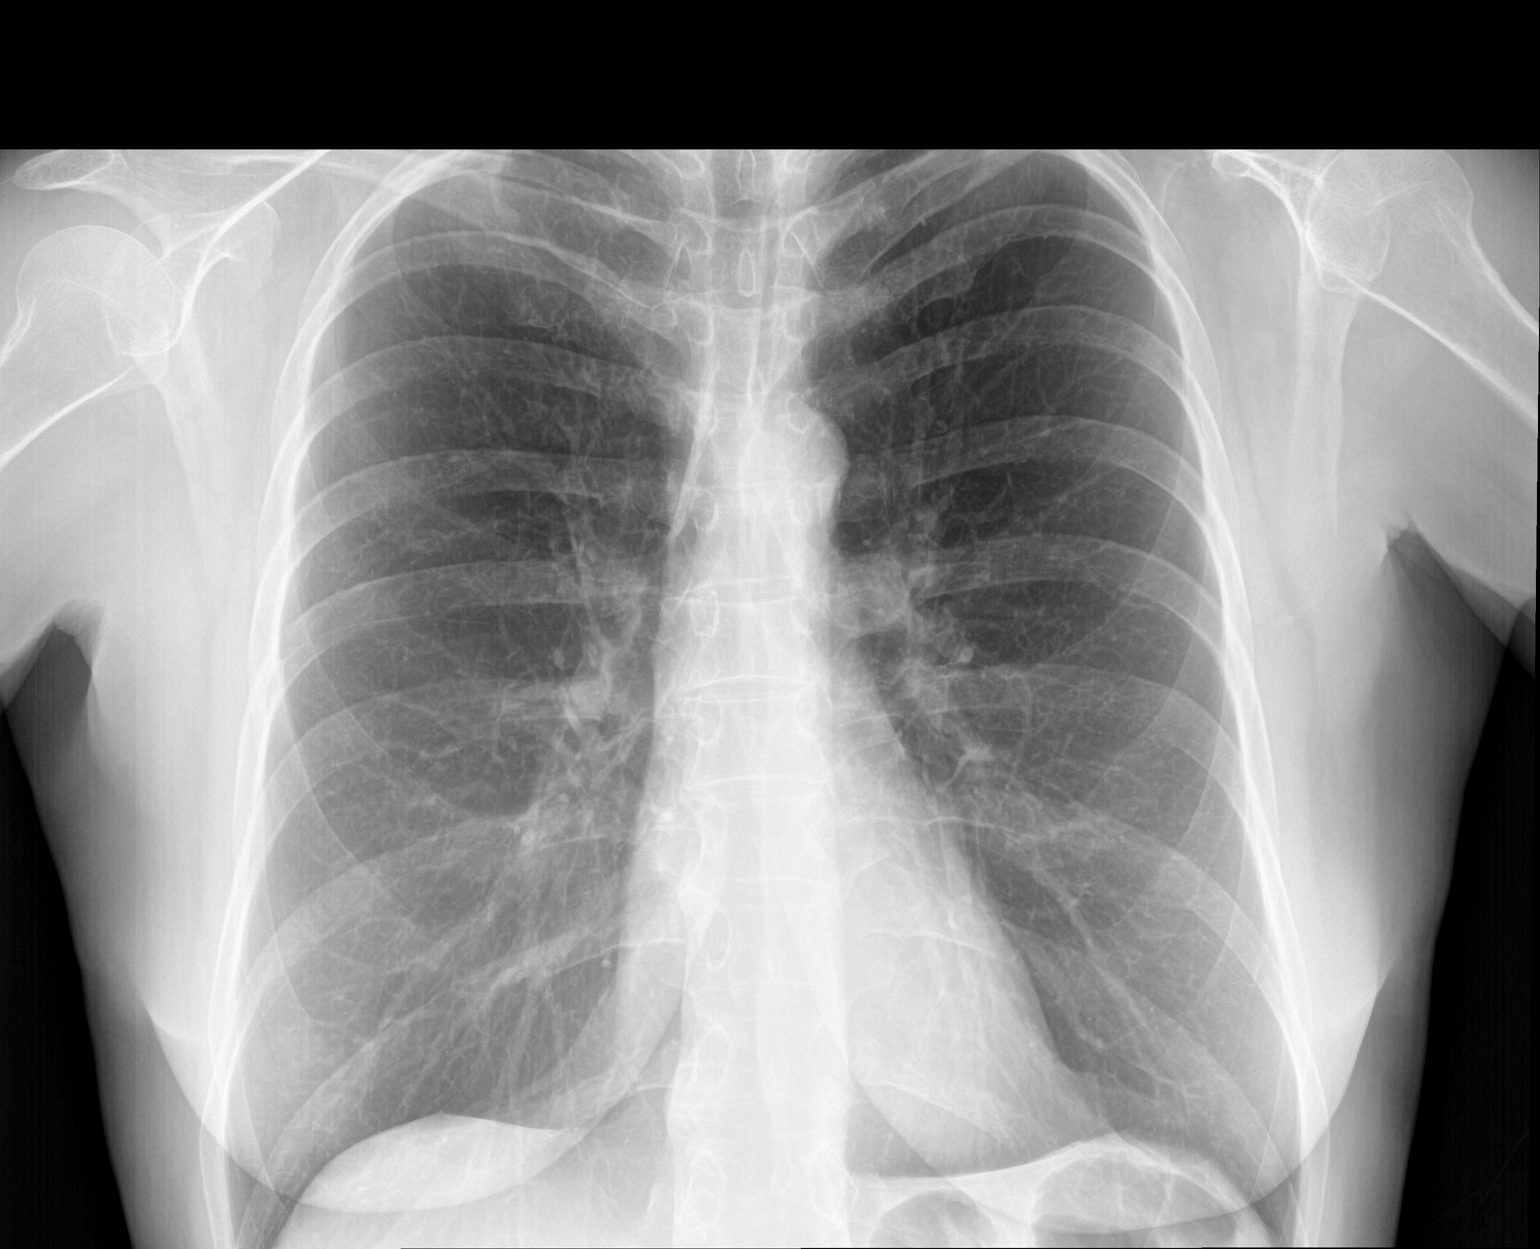

[chest lat]
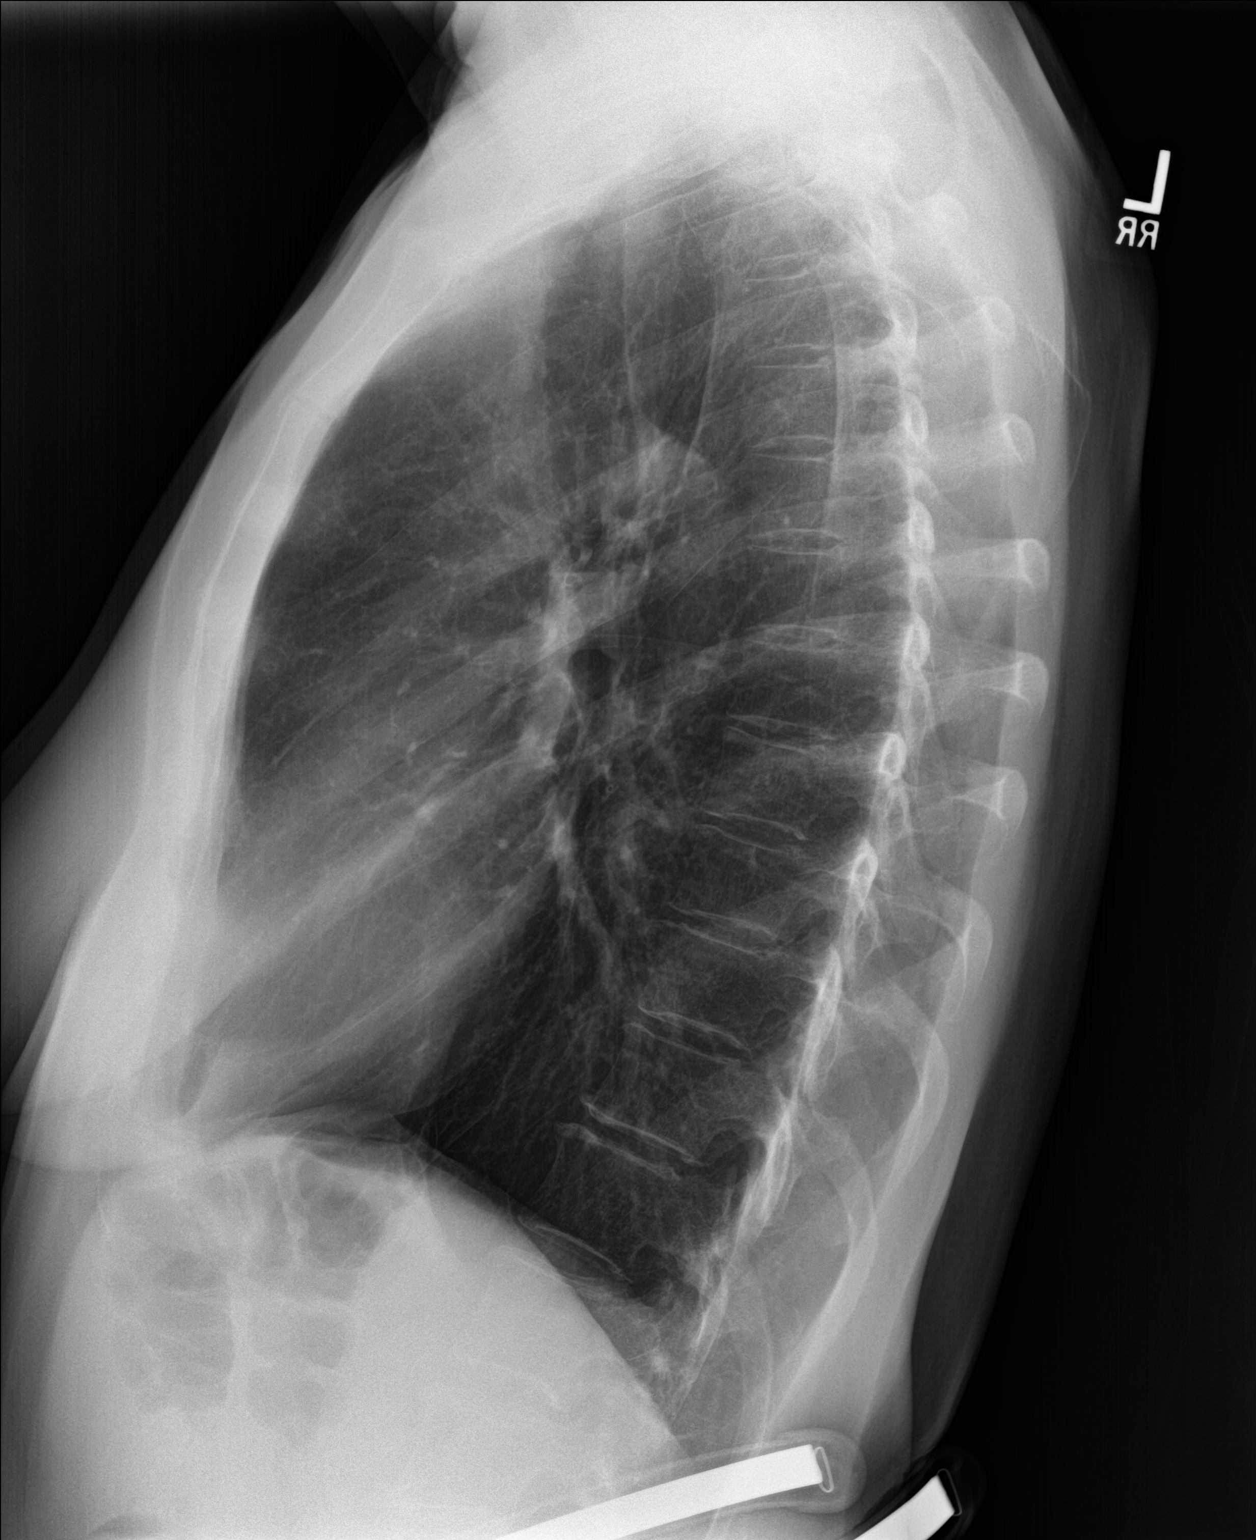

[chest pa (2 of 2)]
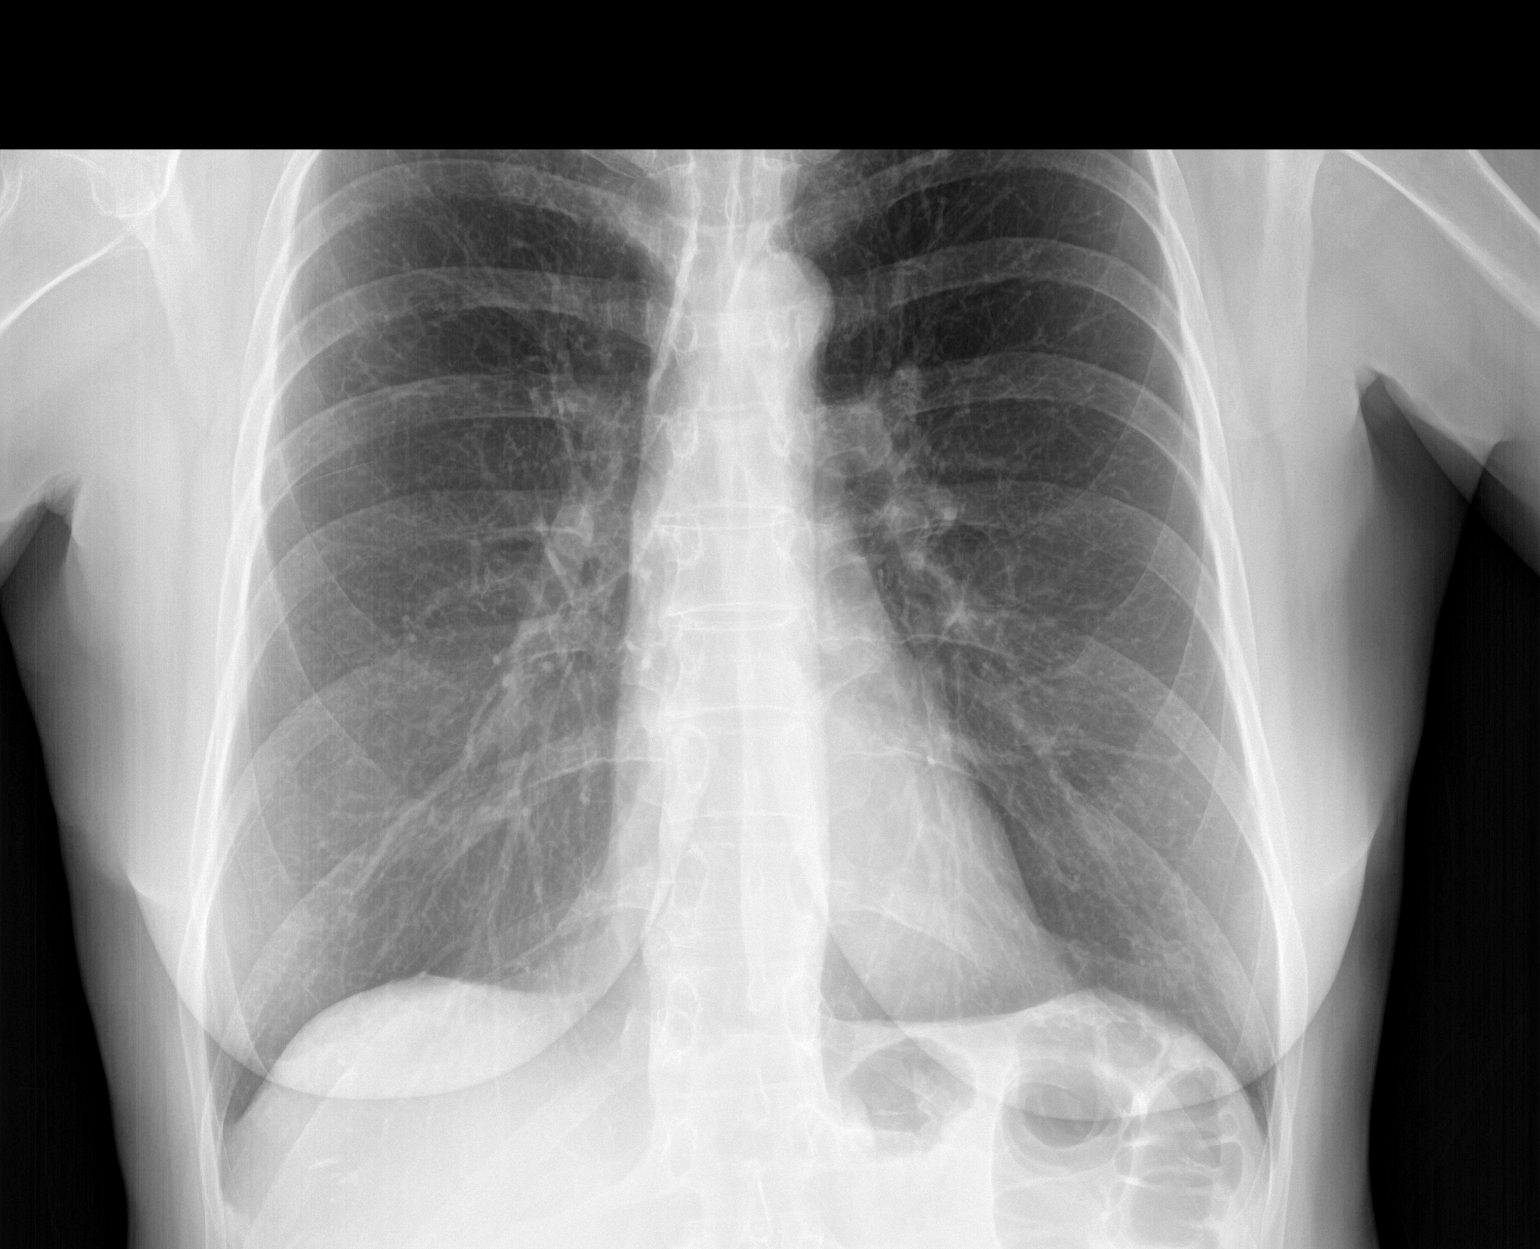

[3 of 3 positions shown; findings below may reference images not displayed]

FINDINGS: The cardiomediastinal silhouette is unremarkable.

There is no evidence of focal airspace disease, pulmonary edema,
suspicious pulmonary nodule/mass, pleural effusion, or pneumothorax.
No acute bony abnormalities are identified.
IMPRESSION: No active cardiopulmonary disease.

## 2019-10-21 ENCOUNTER — Other Ambulatory Visit: Payer: Self-pay | Admitting: Family Medicine

## 2019-11-03 ENCOUNTER — Ambulatory Visit: Payer: BC Managed Care – PPO | Admitting: Dermatology

## 2019-11-03 ENCOUNTER — Other Ambulatory Visit: Payer: Self-pay

## 2019-11-03 DIAGNOSIS — Z1283 Encounter for screening for malignant neoplasm of skin: Secondary | ICD-10-CM | POA: Diagnosis not present

## 2019-11-03 DIAGNOSIS — L570 Actinic keratosis: Secondary | ICD-10-CM

## 2019-11-03 DIAGNOSIS — L918 Other hypertrophic disorders of the skin: Secondary | ICD-10-CM

## 2019-11-03 DIAGNOSIS — D1801 Hemangioma of skin and subcutaneous tissue: Secondary | ICD-10-CM

## 2019-11-03 DIAGNOSIS — L821 Other seborrheic keratosis: Secondary | ICD-10-CM

## 2019-11-03 NOTE — Patient Instructions (Signed)
Multiple skin issues were addressed today.  We discussed the use of tretinoin for minor sun damage on the face.  I told Alexandra Grant that I rarely see dramatic benefits from this cream but she will do some reading and if she does want a prescription we will be happy to follow them.  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  On her right cheek she has had an 8 mm uniform tan texture elevation which represents a benign seborrheic keratosis.  Alexandra Grant will decide in the future if she would like this removed.  On her upper chest are 2 subtle flat pink crusts which represent solar keratoses; these were treated with liquid nitrogen freeze.  Below the breasts are multiple small red angiomas and below the right rib cage is a skin tag.  There are no atypical moles.

## 2019-11-04 NOTE — Progress Notes (Addendum)
   Follow-Up Visit   Subjective  Alexandra Grant is a 59 y.o. female who presents for the following: Skin Problem (Patient here to check spots. Concerns chest area, abdomen right side and right thumb (hard area on that finger). ).  New spots Location: chest Duration: 6 months Quality:sensitive  Associated Signs/Symptoms: Modifying Factors:  Severity:  Timing: Context:   The following portions of the chart were reviewed this encounter and updated as appropriate:     Objective  Well appearing patient in no apparent distress; mood and affect are within normal limits.  A full examination was performed including scalp, head, eyes, ears, nose, lips, neck, chest, axillae, abdomen, back, buttocks, bilateral upper extremities, bilateral lower extremities, hands, feet, fingers, toes, fingernails, and toenails. All findings within normal limits unless otherwise noted below. Multiple skin issues were addressed today.  We discussed the use of tretinoin for minor sun damage on the face.  I told Treacy that I rarely see dramatic benefits from this cream but she will do some reading and if she does want a prescription we will be happy to phone it in.  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 I do not think that there is a topical therapy that will improve that.  On her right cheek she has had an 8 mm uniform tan texture elevation which represents a benign seborrheic keratosis.  Emy will decide in the future if she would like this removed.  On her upper chest are 2 subtle flat pink crusts which represent solar keratoses; these were treated with liquid nitrogen freeze.  Below the breasts are multiple small red angiomas and below the right rib cage is a skin tag.  There are no atypical moles.  Assessment & Plan  AK (actinic keratosis) (2) Left Breast; Right Breast  Destruction of lesion - Left Breast, Right  Breast  Destruction method: cryotherapy   Informed consent: discussed and consent obtained   Lesion destroyed using liquid nitrogen: Yes   Region frozen until ice ball extended beyond lesion: Yes   Outcome: patient tolerated procedure well with no complications    Seborrheic keratosis Right Buccal Cheek   Reassure, recheck if changes

## 2019-11-08 NOTE — Addendum Note (Signed)
Addended by: Lavonna Monarch on: 11/08/2019 06:08 PM   Modules accepted: Level of Service

## 2019-11-11 ENCOUNTER — Other Ambulatory Visit: Payer: Self-pay | Admitting: Internal Medicine

## 2019-11-12 ENCOUNTER — Other Ambulatory Visit: Payer: Self-pay

## 2019-11-12 MED ORDER — DILTIAZEM HCL ER COATED BEADS 120 MG PO CP24: 120.0000 mg | ORAL_CAPSULE | Freq: Every day | ORAL | 0 refills | Status: DC

## 2019-11-12 NOTE — Telephone Encounter (Signed)
Pt's medication was sent to pt's pharmacy as requested. Confirmation received.  °

## 2019-11-13 ENCOUNTER — Other Ambulatory Visit: Payer: Self-pay | Admitting: Internal Medicine

## 2019-11-24 ENCOUNTER — Ambulatory Visit: Payer: BC Managed Care – PPO | Admitting: Internal Medicine

## 2019-11-24 ENCOUNTER — Other Ambulatory Visit: Payer: Self-pay

## 2019-11-24 ENCOUNTER — Encounter: Payer: Self-pay | Admitting: Internal Medicine

## 2019-11-24 VITALS — BP 112/78 | HR 62 | Ht 66.0 in | Wt 166.6 lb

## 2019-11-24 DIAGNOSIS — I471 Supraventricular tachycardia: Secondary | ICD-10-CM | POA: Diagnosis not present

## 2019-11-24 DIAGNOSIS — I480 Paroxysmal atrial fibrillation: Secondary | ICD-10-CM

## 2019-11-24 NOTE — Patient Instructions (Signed)

## 2019-11-24 NOTE — Progress Notes (Signed)
HPI Alexandra Grant returns today for followup. She is a pleasant 59 yo woman with a h/o SVT s/p ablation 10 years ago. She had a recurrence over a year ago. She has done well in the interim. She has not had recurrent SVT. She admits to dietary indiscretion and has gained over 10 lbs.  No Known Allergies   Current Outpatient Medications  Medication Sig Dispense Refill  . Albuterol Sulfate (PROAIR RESPICLICK) 123XX123 (90 Base) MCG/ACT AEPB Inhale 2 puffs into the lungs every 4 (four) hours as needed. 1 each 3  . ALPRAZolam (XANAX) 1 MG tablet TAKE 1 TABLET BY MOUTH AT BEDTIME AS NEEDED FOR ANXIETY 30 tablet 1  . budesonide-formoterol (SYMBICORT) 160-4.5 MCG/ACT inhaler Symbicort 160 mcg-4.5 mcg/actuation HFA aerosol inhaler  INHALE 2 PUFFS BY MOUTH TWICE DAILY    . Cholecalciferol 1000 units capsule Take 1,000 Units by mouth daily.    . D3-50 1.25 MG (50000 UT) capsule Take 50,000 Units by mouth once a week.    . diltiazem (CARDIZEM CD) 120 MG 24 hr capsule Take 1 capsule by mouth once daily 90 capsule 3  . diltiazem (CARDIZEM) 30 MG tablet Take 1 tablet (30 mg total) by mouth 3 (three) times daily as needed. At onset of atrial fibrillation/fast heart rate 30 tablet 3  . EQ ASPIRIN ADULT LOW DOSE 81 MG EC tablet Take 1 tablet by mouth once daily 90 tablet 2  . ibuprofen (ADVIL,MOTRIN) 200 MG tablet Take 800 mg by mouth every 6 (six) hours as needed for headache or moderate pain.    . meloxicam (MOBIC) 7.5 MG tablet meloxicam 7.5 mg tablet    . OVER THE COUNTER MEDICATION Stool softner daily     No current facility-administered medications for this visit.     Past Medical History:  Diagnosis Date  . Asthma    adult onset, confirmed with pre- and post-bronchodialator PFTs 06/13/2017 at PCP.  Marland Kitchen Menorrhagia 2014  . Osteoporosis   . Paroxysmal atrial fibrillation (HCC)   . Paroxysmal SVT (supraventricular tachycardia) (HCC)     ROS:   All systems reviewed and negative except as noted  in the HPI.   History reviewed. No pertinent surgical history.   Family History  Problem Relation Age of Onset  . Hypertension Mother   . Heart disease Mother        stent placement  . Hyperlipidemia Mother   . Diabetes Maternal Aunt   . Heart disease Maternal Grandmother      Social History   Socioeconomic History  . Marital status: Married    Spouse name: Not on file  . Number of children: Not on file  . Years of education: Not on file  . Highest education level: Not on file  Occupational History  . Not on file  Tobacco Use  . Smoking status: Former Smoker    Years: 20.00    Types: Cigarettes  . Smokeless tobacco: Never Used  . Tobacco comment: QUIT 2015  Substance and Sexual Activity  . Alcohol use: No    Alcohol/week: 14.0 standard drinks    Types: 14 Standard drinks or equivalent per week  . Drug use: No  . Sexual activity: Yes    Partners: Male    Birth control/protection: Other-see comments    Comment: vasectomy, INTERCOURSE AGE 17SEXUAL PARTNERS LESS THAN 5  Other Topics Concern  . Not on file  Social History Narrative  . Not on file   Social Determinants of  Health   Financial Resource Strain:   . Difficulty of Paying Living Expenses:   Food Insecurity:   . Worried About Charity fundraiser in the Last Year:   . Arboriculturist in the Last Year:   Transportation Needs:   . Film/video editor (Medical):   Marland Kitchen Lack of Transportation (Non-Medical):   Physical Activity:   . Days of Exercise per Week:   . Minutes of Exercise per Session:   Stress:   . Feeling of Stress :   Social Connections:   . Frequency of Communication with Friends and Family:   . Frequency of Social Gatherings with Friends and Family:   . Attends Religious Services:   . Active Member of Clubs or Organizations:   . Attends Archivist Meetings:   Marland Kitchen Marital Status:   Intimate Partner Violence:   . Fear of Current or Ex-Partner:   . Emotionally Abused:   Marland Kitchen  Physically Abused:   . Sexually Abused:      BP 112/78   Pulse 62   Ht 5\' 6"  (1.676 m)   Wt 166 lb 9.6 oz (75.6 kg)   SpO2 98%   BMI 26.89 kg/m   Physical Exam:  Well appearing NAD HEENT: Unremarkable Neck:  No JVD, no thyromegally Lymphatics:  No adenopathy Back:  No CVA tenderness Lungs:  Clear with no wheezes HEART:  Regular rate rhythm, no murmurs, no rubs, no clicks Abd:  soft, positive bowel sounds, no organomegally, no rebound, no guarding Ext:  2 plus pulses, no edema, no cyanosis, no clubbing Skin:  No rashes no nodules Neuro:  CN II through XII intact, motor grossly intact  EKG - nsr   Assess/Plan: 1. SVT - her symptoms are well controlled on calcium channel blocker therapy. She will continue her current meds. I have encouraged the patient to avoid caffeine and ETOH. 2. Weight gain - she is up 10 lbs in the past year and is encouraged to lose weight.  Mikle [email or web address].D.

## 2019-12-15 ENCOUNTER — Other Ambulatory Visit: Payer: Self-pay | Admitting: Family Medicine

## 2019-12-15 NOTE — Telephone Encounter (Signed)
Requested medication (s) are due for refill today:  Yes  Requested medication (s) are on the active medication list:  Yes  Future visit scheduled:  no  Last Refill: Historical Provider  Requested Prescriptions  Pending Prescriptions Disp Refills   SYMBICORT 160-4.5 MCG/ACT inhaler [Pharmacy Med Name: Symbicort 160-4.5 MCG/ACT Inhalation Aerosol] 11 g 0    Sig: Inhale 2 puffs by mouth twice daily      Pulmonology:  Combination Products Passed - 12/15/2019  4:53 PM      Passed - Valid encounter within last 12 months    Recent Outpatient Visits           11 months ago Mild persistent asthma without complication   Primary Care at [facility], Arlie Solomons, MD   1 year ago Sciatica of left side   Primary Care at [place], [place] D, PA-C   2 years ago Fever, unspecified   [facility] at [facility], Laurey Arrow, MD   2 years ago Wheezing   Primary Care at [place], [place] D, PA-C   2 years ago Mild persistent reactive airway disease without complication   [facility] at [facility], Laurey Arrow, MD

## 2020-01-04 ENCOUNTER — Other Ambulatory Visit: Payer: Self-pay | Admitting: Family Medicine

## 2020-01-04 ENCOUNTER — Telehealth: Payer: Self-pay | Admitting: Family Medicine

## 2020-01-04 NOTE — Telephone Encounter (Signed)
Requested medication (s) are due for refill today - no- but no more RF  Requested medication (s) are on the active medication list -yes  Future visit scheduled -no  Last refill: 12/26/19  Notes to clinic: Call to patient to schedule appointment with new PCP- she is checking closer to home and will call back if she wants to stay within PCP practice.  Requested Prescriptions  Pending Prescriptions Disp Refills   SYMBICORT 160-4.5 MCG/ACT inhaler [Pharmacy Med Name: Symbicort 160-4.5 MCG/ACT Inhalation Aerosol] 11 g 0    Sig: Inhale 2 puffs by mouth twice daily      Pulmonology:  Combination Products Failed - 01/04/2020  9:16 AM      Failed - Valid encounter within last 12 months    Recent Outpatient Visits           12 months ago Mild persistent asthma without complication   Primary Care at [place], MD   1 year ago Sciatica of left side   Primary Care at [place], [place] D, PA-C   2 years ago Fever, unspecified   [facility] at [facility], Laurey Arrow, MD   2 years ago Wheezing   Primary Care at [place], [place] D, PA-C   2 years ago Mild persistent reactive airway disease without complication   [facility] at [facility], Laurey Arrow, MD                  Requested Prescriptions  Pending Prescriptions Disp Refills   SYMBICORT 160-4.5 MCG/ACT inhaler [Pharmacy Med Name: Symbicort 160-4.5 MCG/ACT Inhalation Aerosol] 11 g 0    Sig: Inhale 2 puffs by mouth twice daily      Pulmonology:  Combination Products Failed - 01/04/2020  9:16 AM      Failed - Valid encounter within last 12 months    Recent Outpatient Visits           12 months ago Mild persistent asthma without complication   Primary Care at [place], MD   1 year ago Sciatica of left side   Primary Care at [place], [place] D, PA-C   2 years ago Fever, unspecified   [facility] at [facility], Laurey Arrow, MD   2 years ago Wheezing   Primary Care at [place],  [place] D, PA-C   2 years ago Mild persistent reactive airway disease without complication   [facility] at [facility], Laurey Arrow, MD

## 2020-01-04 NOTE — Telephone Encounter (Signed)
Caller: Alexandra Grant Call back phone number: [phone]  Patient is wondering if you could accept her as a patient. Patient states her uncle Alexandra Grant MRN [id number] refer her.  Please advise.

## 2020-01-04 NOTE — Telephone Encounter (Signed)
ok 

## 2020-01-12 ENCOUNTER — Other Ambulatory Visit: Payer: Self-pay | Admitting: Family Medicine

## 2020-01-12 MED ORDER — SYMBICORT 160-4.5 MCG/ACT IN AERO: 2.0000 | INHALATION_SPRAY | Freq: Two times a day (BID) | RESPIRATORY_TRACT | 0 refills | Status: DC

## 2020-01-12 NOTE — Telephone Encounter (Signed)
Medication Refill - Medication: symbicort   Has the patient contacted their pharmacy? Yes.   Pt states that she has another doctor lined up and is needing this refilled one more time before her appt. Please advise.  (Agent: If no, request that the patient contact the pharmacy for the refill.) (Agent: If yes, when and what did the pharmacy advise?)  Preferred Pharmacy (with phone number or street name):  [place], [place] - [id number] [place] [address]  Phone: [phone] Fax: [phone]  Not a 24 hour pharmacy; exact hours not known.     Agent: Please be advised that RX refills may take up to 3 business days. We ask that you follow-up with your pharmacy.

## 2020-01-12 NOTE — Telephone Encounter (Signed)
Pt states that she has another doctor lined up and is needing this refilled one more time before her appt. Please advise

## 2020-01-23 NOTE — Patient Instructions (Addendum)
It was very nice to see you today, I will be in touch with your labs as soon as possible You got your first shingles vaccine today- we can give your 2nd dose in 2-6 months as a nurse visit I ordered your mammogram today  Take care!  Work on getting back into the exercise habit

## 2020-01-23 NOTE — Progress Notes (Addendum)
[facility], [place], [place] [address] [phone]  Date:  01/25/2020   Name:  Alexandra Grant   DOB:  July 08, 1961   MRN:  [id number]  PCP:  Alexandra Mclean, MD    Chief Complaint: New Patient (Initial Visit) (lab work, fasting) and Medication Refill (alprazolam)   History of Present Illness:  Alexandra Grant is a 59 y.o. very pleasant female patient who presents with the following:  Here today as a new patient to establish care-I have not seen her myself in the past but I take care of her uncle who referred her to see Alexandra Grant patient at [facility]  She had a history of supraventricular tachycardia in the past, had an ablation 10 years ago and then some recurrence about 1 year ago.  Her cardiologist is Dr. Hedy Jacob saw him in April Currently her symptoms are controlled on diltiazem However pt reports she never actually had ablation She also has history of mild asthma- uses symbicort and occasionally needs albuterol   COVID-19 series- done  Mammogram- this is due; ordered today  Needs hepatitis C screening Colonoscopy is up-to-date Pap is up-to-date Due for routine labs today- she is fasting today   She just established with Dr Denton Lank, endo with [phone]- he checked her thyroid and is following up on her osteoporosis as well   Diltiazem Symbicort  She uses alprazolam on a rare basis and needs a refill today   Married to Alexandra Grant, she has 2 adult daughters and 2 grands- aged 2 months and nearly 26 yo  She also has a Restaurant manager, fast food puppy  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 Paroxysmal supraventricular tachycardia ([place]) 09/11/2017  . Paroxysmal atrial fibrillation ([facility]) 09/11/2017  . Asthma in adult, moderate persistent, uncomplicated 95/04/3266  . Osteoporosis without current pathological fracture 12/24/2016  . 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 DUB (dysfunctional uterine bleeding) 04/27/2013    Past Medical History:  Diagnosis Date  . Asthma    adult onset, confirmed with pre- and post-bronchodialator PFTs 06/13/2017 at PCP.  Marland Kitchen Menorrhagia 2014  . Osteoporosis   . Paroxysmal atrial fibrillation ([facility])   . Paroxysmal SVT (supraventricular tachycardia) ([place])     History reviewed. No pertinent surgical history.  Social History   Tobacco Use  . Smoking status: Grant Smoker    Years: 20.00    Types: Cigarettes  . Smokeless tobacco: Never Used  . Tobacco comment: QUIT 2015  Vaping Use  . Vaping Use: Never used  Substance Use Topics  .  Alcohol use: No    Alcohol/week: 14.0 standard drinks    Types: 14 Standard drinks or equivalent per week  . Drug use: No    Family History  Problem Relation Age of Onset  . Hypertension Mother   . Heart disease Mother        stent placement  . Hyperlipidemia Mother   . Diabetes Maternal Aunt   . Heart disease Maternal Grandmother     No Known Allergies  Medication list has been reviewed and updated.  Current Outpatient Medications on File Prior to Visit  Medication Sig Dispense Refill  . Albuterol Sulfate (PROAIR RESPICLICK) 732 (90 Base) MCG/ACT AEPB Inhale 2 puffs into the lungs every 4 (four) hours as needed. 1 each 3  . ALPRAZolam (XANAX) 1 MG tablet TAKE 1 TABLET BY MOUTH AT BEDTIME AS NEEDED FOR ANXIETY 30 tablet 1  . D3-50 1.25 MG (50000 UT) capsule Take 50,000 Units by mouth  once a week.    . diltiazem (CARDIZEM CD) 120 MG 24 hr capsule Take 1 capsule by mouth once daily 90 capsule 3  . EQ ASPIRIN ADULT LOW DOSE 81 MG EC tablet Take 1 tablet by mouth once daily 90 tablet 2  . meloxicam (MOBIC) 7.5 MG tablet meloxicam 7.5 mg tablet    . OVER THE COUNTER MEDICATION Stool softner daily    . SYMBICORT 160-4.5 MCG/ACT inhaler Inhale 2 puffs into the lungs 2 (two) times daily. 11 g 0   No current facility-administered medications on file prior to visit.    Review of Systems:  As per HPI- otherwise negative.   Physical Examination: Vitals:   01/25/20 0908  BP: 122/80  Pulse: 70  Resp: 17  Temp: 97.9 F (36.6 C)  SpO2: 98%   Vitals:   01/25/20 0908  Weight: 168 lb (76.2 kg)  Height: 5\' 6"  (1.676 m)   Body mass index is 27.12 kg/m. Ideal Body Weight: Weight in (lb) to have BMI = 25: 154.6  GEN: no acute distress.  Mild overweight, looks well  HEENT: Atraumatic, Normocephalic.   Bilateral TM wnl, oropharynx normal.  PEERL,EOMI.   Ears and Nose: No external deformity. CV: RRR, No M/G/R. No JVD. No thrill. No extra heart sounds. PULM: CTA B, no wheezes, crackles, rhonchi. No retractions. No resp. distress. No accessory muscle use. ABD: S, NT, ND, +BS. No rebound. No HSM. EXTR: No c/c/e PSYCH: Normally interactive. Conversant.    Assessment and Plan: Encounter for medical examination to establish care  SVT (supraventricular tachycardia) ([place] [facility]) - Plan: CBC, CANCELED: TSH  Screening for hyperlipidemia - Plan: Lipid panel  Screening for thyroid disorder - Plan: CANCELED: TSH  Screening for diabetes mellitus - Plan: Comprehensive metabolic panel, Hemoglobin A1c  Encounter for hepatitis C screening test for low risk patient - Plan: Hepatitis C antibody  Vitamin D deficiency - Plan: VITAMIN D 25 Hydroxy (Vit-D Deficiency, Fractures)  Encounter for screening mammogram for malignant neoplasm of breast - Plan: MM 3D SCREEN BREAST BILATERAL  Other  insomnia - Plan: ALPRAZolam (XANAX) 1 MG tablet  Immunization due - Plan: Varicella-zoster vaccine IM (Shingrix)  Mild persistent asthma without complication - Plan: SYMBICORT 160-4.5 MCG/ACT inhaler, Albuterol Sulfate (PROAIR RESPICLICK) 202 (90 Base) MCG/ACT AEPB  establishing care today Refilled medications as above shingirx #1 done today  Encourage exercise Ordered mammo Will plan further follow- up pending labs. Moderate medical decision making today   This visit occurred during the SARS-CoV-2 public health emergency.  Safety protocols were in  place, including screening questions prior to the visit, additional usage of staff PPE, and extensive cleaning of exam room while observing appropriate contact time as indicated for disinfecting solutions.    Signed Lamar Blinks, MD Received her labs as below, message to pt  Results for orders placed or performed in visit on 01/25/20  CBC  Result Value Ref Range   WBC 5.3 4.0 - 10.5 K/uL   RBC 4.48 3.87 - 5.11 Mil/uL   Platelets 262.0 150 - 400 K/uL   Hemoglobin 14.5 12.0 - 15.0 g/dL   HCT 43.2 36 - 46 %   MCV 96.5 78.0 - 100.0 fl   MCHC 33.6 30.0 - 36.0 g/dL   RDW 12.5 11.5 - 15.5 %  Comprehensive metabolic panel  Result Value Ref Range   Sodium 139 135 - 145 mEq/L   Potassium 4.5 3.5 - 5.1 mEq/L   Chloride 104 96 - 112 mEq/L   CO2 29 19 - 32 mEq/L   Glucose, Bld 96 70 - 99 mg/dL   BUN 14 6 - 23 mg/dL   Creatinine, Ser 0.81 0.40 - 1.20 mg/dL   Total Bilirubin 0.8 0.2 - 1.2 mg/dL   Alkaline Phosphatase 68 39 - 117 U/L   AST 16 0 - 37 U/L   ALT 18 0 - 35 U/L   Total Protein 6.9 6.0 - 8.3 g/dL   Albumin 4.4 3.5 - 5.2 g/dL   GFR 72.70 >60.00 mL/min   Calcium 9.5 8.4 - 10.5 mg/dL  Hemoglobin A1c  Result Value Ref Range   Hgb A1c MFr Bld 5.6 4.6 - 6.5 %  Lipid panel  Result Value Ref Range   Cholesterol 206 (H) 0 - 200 mg/dL   Triglycerides 70.0 0 - 149 mg/dL   HDL 78.40 >39.00 mg/dL   VLDL 14.0 0.0 - 40.0 mg/dL    LDL Cholesterol 114 (H) 0 - 99 mg/dL   Total CHOL/HDL Ratio 3    NonHDL 127.83   VITAMIN D 25 Hydroxy (Vit-D Deficiency, Fractures)  Result Value Ref Range   VITD 52.06 30.00 - 100.00 ng/mL

## 2020-01-25 ENCOUNTER — Other Ambulatory Visit: Payer: Self-pay

## 2020-01-25 ENCOUNTER — Ambulatory Visit: Payer: BC Managed Care – PPO | Admitting: Family Medicine

## 2020-01-25 ENCOUNTER — Encounter: Payer: Self-pay | Admitting: Family Medicine

## 2020-01-25 VITALS — BP 122/80 | HR 70 | Temp 97.9°F | Resp 17 | Ht 66.0 in | Wt 168.0 lb

## 2020-01-25 DIAGNOSIS — Z0000 Encounter for general adult medical examination without abnormal findings: Secondary | ICD-10-CM

## 2020-01-25 DIAGNOSIS — J4530 Mild persistent asthma, uncomplicated: Secondary | ICD-10-CM

## 2020-01-25 DIAGNOSIS — Z23 Encounter for immunization: Secondary | ICD-10-CM | POA: Diagnosis not present

## 2020-01-25 DIAGNOSIS — G4709 Other insomnia: Secondary | ICD-10-CM

## 2020-01-25 DIAGNOSIS — Z1329 Encounter for screening for other suspected endocrine disorder: Secondary | ICD-10-CM

## 2020-01-25 DIAGNOSIS — I471 Supraventricular tachycardia, unspecified: Secondary | ICD-10-CM

## 2020-01-25 DIAGNOSIS — E559 Vitamin D deficiency, unspecified: Secondary | ICD-10-CM | POA: Diagnosis not present

## 2020-01-25 DIAGNOSIS — Z131 Encounter for screening for diabetes mellitus: Secondary | ICD-10-CM

## 2020-01-25 DIAGNOSIS — Z13220 Encounter for screening for lipoid disorders: Secondary | ICD-10-CM | POA: Diagnosis not present

## 2020-01-25 DIAGNOSIS — Z1231 Encounter for screening mammogram for malignant neoplasm of breast: Secondary | ICD-10-CM

## 2020-01-25 DIAGNOSIS — Z1159 Encounter for screening for other viral diseases: Secondary | ICD-10-CM

## 2020-01-25 LAB — COMPREHENSIVE METABOLIC PANEL
ALT: 18 U/L (ref 0–35)
AST: 16 U/L (ref 0–37)
Albumin: 4.4 g/dL (ref 3.5–5.2)
Alkaline Phosphatase: 68 U/L (ref 39–117)
BUN: 14 mg/dL (ref 6–23)
CO2: 29 mEq/L (ref 19–32)
Calcium: 9.5 mg/dL (ref 8.4–10.5)
Chloride: 104 mEq/L (ref 96–112)
Creatinine, Ser: 0.81 mg/dL (ref 0.40–1.20)
GFR: 72.7 mL/min (ref >60.00)
Glucose, Bld: 96 mg/dL (ref 70–99)
Potassium: 4.5 mEq/L (ref 3.5–5.1)
Sodium: 139 mEq/L (ref 135–145)
Total Bilirubin: 0.8 mg/dL (ref 0.2–1.2)
Total Protein: 6.9 g/dL (ref 6.0–8.3)

## 2020-01-25 LAB — CBC
HCT: 43.2 % (ref 36.0–46.0)
Hemoglobin: 14.5 g/dL (ref 12.0–15.0)
MCHC: 33.6 g/dL (ref 30.0–36.0)
MCV: 96.5 fl (ref 78.0–100.0)
Platelets: 262 10*3/uL (ref 150.0–400.0)
RBC: 4.48 Mil/uL (ref 3.87–5.11)
RDW: 12.5 % (ref 11.5–15.5)
WBC: 5.3 10*3/uL (ref 4.0–10.5)

## 2020-01-25 LAB — LIPID PANEL
Cholesterol: 206 mg/dL — ABNORMAL HIGH (ref 0–200)
HDL: 78.4 mg/dL (ref >39.00)
LDL Cholesterol: 114 mg/dL — ABNORMAL HIGH (ref 0–99)
NonHDL: 127.83
Total CHOL/HDL Ratio: 3
Triglycerides: 70 mg/dL (ref 0.0–149.0)
VLDL: 14 mg/dL (ref 0.0–40.0)

## 2020-01-25 LAB — VITAMIN D 25 HYDROXY (VIT D DEFICIENCY, FRACTURES): VITD: 52.06 ng/mL (ref 30.00–100.00)

## 2020-01-25 LAB — HEMOGLOBIN A1C: Hgb A1c MFr Bld: 5.6 % (ref 4.6–6.5)

## 2020-01-25 MED ORDER — ALPRAZOLAM 1 MG PO TABS: ORAL_TABLET | ORAL | 1 refills | Status: DC

## 2020-01-25 MED ORDER — PROAIR RESPICLICK 108 (90 BASE) MCG/ACT IN AEPB: 2.0000 | INHALATION_SPRAY | RESPIRATORY_TRACT | 3 refills | Status: DC | PRN

## 2020-01-25 MED ORDER — SYMBICORT 160-4.5 MCG/ACT IN AERO: 2.0000 | INHALATION_SPRAY | Freq: Two times a day (BID) | RESPIRATORY_TRACT | 4 refills | Status: DC

## 2020-01-26 LAB — HEPATITIS C ANTIBODY
Hepatitis C Ab: NONREACTIVE
SIGNAL TO CUT-OFF: 0 (ref <1.00)

## 2020-01-28 LAB — HM MAMMOGRAPHY

## 2020-02-04 ENCOUNTER — Encounter: Payer: Self-pay | Admitting: *Deleted

## 2020-04-27 ENCOUNTER — Ambulatory Visit: Payer: BC Managed Care – PPO

## 2020-05-12 ENCOUNTER — Telehealth: Payer: Self-pay | Admitting: Internal Medicine

## 2020-05-12 NOTE — Telephone Encounter (Signed)
Patient c/o Palpitations:  High priority if patient c/o lightheadedness, shortness of breath, or chest pain  1) How long have you had palpitations/irregular HR/ Afib? Increased of palpitations for the past month of so.  Are you having the symptoms now? no  2) Are you currently experiencing lightheadedness, SOB or CP? She does have trouble breathing when she has the palpitations.    3) Do you have a history of afib (atrial fibrillation) or irregular heart rhythm? Has HX of irregular heart has A-FIB  4) Have you checked your BP or HR? (document readings if available): HR 150-250 during the events.   5) Are you experiencing any other symptoms? Just SOB, and anxiety with them.

## 2020-05-12 NOTE — Telephone Encounter (Signed)
Spoke with the patient who states that she has had two episodes of palpitations in the past couple weeks. The first episode happened while she was at rest in church and the second episode happened while she was on a walk. She states that during these episodes her heart rate was up into 150s-180s. She reports feeling her heart racing, some shortness of breath, rapid breathing, and some dizziness during these episodes. She states that they have felt like panic attacks. She is taking diltiazem daily but also has diltiazem prn that she has taken during both episodes which seemed to help. She denies any recent caffeine or alcohol use (states that she mostly cut these out of her diet a long time ago). Advised patient to continue to monitor and use the diltiazem as needed. I will let Dr. Lovena Le and his nurse know and we will be in touch with any further recommendations.

## 2020-05-13 NOTE — Telephone Encounter (Signed)
mychart message sent to Pt  Per Dr. Lovena Le we have 3 possible options for treatment for you:  1-  We could add a beta blocker to your regimen (metoprolol succinate would be drug of choice) 2- We could increase your cardizem to 240 mg daily  3- You could wear a heart monitor to better characterize your palpitations.    Per Dr. Elenore Paddy in 3-4 weeks after initiation of one of the above therapies.

## 2020-05-16 MED ORDER — DILTIAZEM HCL ER COATED BEADS 240 MG PO CP24
240.0000 mg | ORAL_CAPSULE | Freq: Every day | ORAL | 3 refills | Status: DC
Start: 2020-05-16 — End: 2020-06-20

## 2020-06-17 ENCOUNTER — Ambulatory Visit: Payer: BC Managed Care – PPO | Admitting: Internal Medicine

## 2020-06-17 ENCOUNTER — Other Ambulatory Visit: Payer: Self-pay

## 2020-06-17 ENCOUNTER — Encounter: Payer: Self-pay | Admitting: Internal Medicine

## 2020-06-17 VITALS — BP 120/76 | HR 65 | Ht 66.0 in | Wt 170.4 lb

## 2020-06-17 DIAGNOSIS — R002 Palpitations: Secondary | ICD-10-CM | POA: Diagnosis not present

## 2020-06-17 DIAGNOSIS — I471 Supraventricular tachycardia: Secondary | ICD-10-CM | POA: Diagnosis not present

## 2020-06-17 NOTE — Patient Instructions (Addendum)
Medication Instructions:  Your physician recommends that you continue on your current medications as directed. Please refer to the Current Medication list given to you today.  Labwork: None ordered.  Testing/Procedures: None ordered.  Follow-Up:  The following dates are available for procedures: November 22, 29 December 2, 13, 16, 27, 30  Any Other Special Instructions Will Be Listed Below (If Applicable).  If you need a refill on your cardiac medications before your next appointment, please call your pharmacy.    Cardiac Ablation Cardiac ablation is a procedure to disable (ablate) a small amount of heart tissue in very specific places. The heart has many electrical connections. Sometimes these connections are abnormal and can cause the heart to beat very fast or irregularly. Ablating some of the problem areas can improve the heart rhythm or return it to normal. Ablation may be done for people who:  Have Wolff-Parkinson-White syndrome.  Have fast heart rhythms (tachycardia).  Have taken medicines for an abnormal heart rhythm (arrhythmia) that were not effective or caused side effects.  Have a high-risk heartbeat that may be life-threatening. During the procedure, a small incision is made in the neck or the groin, and a long, thin, flexible tube (catheter) is inserted into the incision and moved to the heart. Small devices (electrodes) on the tip of the catheter will send out electrical currents. A type of X-ray (fluoroscopy) will be used to help guide the catheter and to provide images of the heart. Tell a health care provider about:  Any allergies you have.  All medicines you are taking, including vitamins, herbs, eye drops, creams, and over-the-counter medicines.  Any problems you or family members have had with anesthetic medicines.  Any blood disorders you have.  Any surgeries you have had.  Any medical conditions you have, such as kidney failure.  Whether you are  pregnant or may be pregnant. What are the risks? Generally, this is a safe procedure. However, problems may occur, including:  Infection.  Bruising and bleeding at the catheter insertion site.  Bleeding into the chest, especially into the sac that surrounds the heart. This is a serious complication.  Stroke or blood clots.  Damage to other structures or organs.  Allergic reaction to medicines or dyes.  Need for a permanent pacemaker if the normal electrical system is damaged. A pacemaker is a small computer that sends electrical signals to the heart and helps your heart beat normally.  The procedure not being fully effective. This may not be recognized until months later. Repeat ablation procedures are sometimes required. What happens before the procedure?  Follow instructions from your health care provider about eating or drinking restrictions.  Ask your health care provider about: ? Changing or stopping your regular medicines. This is especially important if you are taking diabetes medicines or blood thinners. ? Taking medicines such as aspirin and ibuprofen. These medicines can thin your blood. Do not take these medicines before your procedure if your health care provider instructs you not to.  Plan to have someone take you home from the hospital or clinic.  If you will be going home right after the procedure, plan to have someone with you for 24 hours. What happens during the procedure?  To lower your risk of infection: ? Your health care team will wash or sanitize their hands. ? Your skin will be washed with soap. ? Hair may be removed from the incision area.  An IV tube will be inserted into one of your veins.  You will be given a medicine to help you relax (sedative).  The skin on your neck or groin will be numbed.  An incision will be made in your neck or your groin.  A needle will be inserted through the incision and into a large vein in your neck or groin.  A  catheter will be inserted into the needle and moved to your heart.  Dye may be injected through the catheter to help your surgeon see the area of the heart that needs treatment.  Electrical currents will be sent from the catheter to ablate heart tissue in desired areas. There are three types of energy that may be used to ablate heart tissue: ? Heat (radiofrequency energy). ? Laser energy. ? Extreme cold (cryoablation).  When the necessary tissue has been ablated, the catheter will be removed.  Pressure will be held on the catheter insertion area to prevent excessive bleeding.  A bandage (dressing) will be placed over the catheter insertion area. The procedure may vary among health care providers and hospitals. What happens after the procedure?  Your blood pressure, heart rate, breathing rate, and blood oxygen level will be monitored until the medicines you were given have worn off.  Your catheter insertion area will be monitored for bleeding. You will need to lie still for a few hours to ensure that you do not bleed from the catheter insertion area.  Do not drive for 24 hours or as long as directed by your health care provider. Summary  Cardiac ablation is a procedure to disable (ablate) a small amount of heart tissue in very specific places. Ablating some of the problem areas can improve the heart rhythm or return it to normal.  During the procedure, electrical currents will be sent from the catheter to ablate heart tissue in desired areas. This information is not intended to replace advice given to you by your health care provider. Make sure you discuss any questions you have with your health care provider. Document Revised: 01/20/2018 Document Reviewed: 06/18/2016 Elsevier Patient Education  [employer].

## 2020-06-17 NOTE — Progress Notes (Signed)
HPI Alexandra Grant returns today for ongoing evaluation of palpitations. She has a h/o SVT but with no clear cut documentation. In 2017 she had what sounds like SVT which degenerated into atrial fib. Since then she has had occaisional episodes which she describes as a panic attack where she will feel her heart suddenly start to beat fast, and is associated with severe anxiety. She will take a short acting calcium blocker and a benzodiazapine and over the next hour or more, the symptoms resolve. She has not had frank syncope but she is quite anxious about her spells. She has been on medical therapy with long acting cardizem. No Known Allergies   Current Outpatient Medications  Medication Sig Dispense Refill  . Albuterol Sulfate (PROAIR RESPICLICK) 497 (90 Base) MCG/ACT AEPB Inhale 2 puffs into the lungs every 4 (four) hours as needed. 3 each 3  . ALPRAZolam (XANAX) 1 MG tablet Take 1 daily as needed for anxiety 30 tablet 1  . D3-50 1.25 MG (50000 UT) capsule Take 50,000 Units by mouth once a week. Use as directed    . diltiazem (CARDIZEM CD) 240 MG 24 hr capsule Take 1 capsule (240 mg total) by mouth daily. 90 capsule 3  . EQ ASPIRIN ADULT LOW DOSE 81 MG EC tablet Take 1 tablet by mouth once daily 90 tablet 2  . meloxicam (MOBIC) 7.5 MG tablet Take 7.5 mg by mouth as needed for pain.     Marland Kitchen OVER THE COUNTER MEDICATION Stool softner daily    . SYMBICORT 160-4.5 MCG/ACT inhaler Inhale 2 puffs into the lungs 2 (two) times daily. 3 Inhaler 4   No current facility-administered medications for this visit.     Past Medical History:  Diagnosis Date  . Asthma    adult onset, confirmed with pre- and post-bronchodialator PFTs 06/13/2017 at PCP.  Marland Kitchen Menorrhagia 2014  . Osteoporosis   . Paroxysmal atrial fibrillation ([facility])   . Paroxysmal SVT (supraventricular tachycardia) ([facility])     ROS:   All systems reviewed and negative except as noted in the HPI.   History reviewed. No pertinent  surgical history.   Family History  Problem Relation Age of Onset  . Hypertension Mother   . Heart disease Mother        stent placement  . Hyperlipidemia Mother   . Diabetes Maternal Aunt   . Heart disease Maternal Grandmother      Social History   Socioeconomic History  . Marital status: Married    Spouse name: Not on file  . Number of children: Not on file  . Years of education: Not on file  . Highest education level: Not on file  Occupational History  . Not on file  Tobacco Use  . Smoking status: Former Smoker    Years: 20.00    Types: Cigarettes  . Smokeless tobacco: Never Used  . Tobacco comment: QUIT 2015  Vaping Use  . Vaping Use: Never used  Substance and Sexual Activity  . Alcohol use: No    Alcohol/week: 14.0 standard drinks    Types: 14 Standard drinks or equivalent per week  . Drug use: No  . Sexual activity: Yes    Partners: Male    Birth control/protection: Other-see comments    Comment: vasectomy, INTERCOURSE AGE 17SEXUAL PARTNERS LESS THAN 5  Other Topics Concern  . Not on file  Social History Narrative  . Not on file   Social Determinants of Health   Financial Resource  Strain:   . Difficulty of Paying Living Expenses: Not on file  Food Insecurity:   . Worried About Charity fundraiser in the Last Year: Not on file  . Ran Out of Food in the Last Year: Not on file  Transportation Needs:   . Lack of Transportation (Medical): Not on file  . Lack of Transportation (Non-Medical): Not on file  Physical Activity:   . Days of Exercise per Week: Not on file  . Minutes of Exercise per Session: Not on file  Stress:   . Feeling of Stress : Not on file  Social Connections:   . Frequency of Communication with Friends and Family: Not on file  . Frequency of Social Gatherings with Friends and Family: Not on file  . Attends Religious Services: Not on file  . Active Member of Clubs or Organizations: Not on file  . Attends Archivist Meetings:  Not on file  . Marital Status: Not on file  Intimate Partner Violence:   . Fear of Current or Ex-Partner: Not on file  . Emotionally Abused: Not on file  . Physically Abused: Not on file  . Sexually Abused: Not on file     BP 120/76   Pulse 65   Ht 5\' 6"  (1.676 m)   Wt 170 lb 6.4 oz (77.3 kg)   SpO2 92%   BMI 27.50 kg/m   Physical Exam:  Well appearing NAD HEENT: Unremarkable Neck:  No JVD, no thyromegally Lymphatics:  No adenopathy Back:  No CVA tenderness Lungs:  Clear with no wheezes HEART:  Regular rate rhythm, no murmurs, no rubs, no clicks Abd:  soft, positive bowel sounds, no organomegally, no rebound, no guarding Ext:  2 plus pulses, no edema, no cyanosis, no clubbing Skin:  No rashes no nodules Neuro:  CN II through XII intact, motor grossly intact  EKG - low atrial rhythm with a short PR   Assess/Plan: 1. SVT - I have discussed the treatment options with the patient and her husband. The indictions/risks/benefits/goals/expectations of EP study and catheter ablation were reviewed and she will call us if she wishes to proceed. 2. anixety - she has been taking benzos and I asked her to try and wean off of these meds.  Carleene Overlie Trevaris Pennella,MD

## 2020-06-17 NOTE — H&P (View-Only) (Signed)
HPI Alexandra Grant returns today for ongoing evaluation of palpitations. She has a h/o SVT but with no clear cut documentation. In 2017 she had what sounds like SVT which degenerated into atrial fib. Since then she has had occaisional episodes which she describes as a panic attack where she will feel her heart suddenly start to beat fast, and is associated with severe anxiety. She will take a short acting calcium blocker and a benzodiazapine and over the next hour or more, the symptoms resolve. She has not had frank syncope but she is quite anxious about her spells. She has been on medical therapy with long acting cardizem. No Known Allergies   Current Outpatient Medications  Medication Sig Dispense Refill  . Albuterol Sulfate (PROAIR RESPICLICK) 062 (90 Base) MCG/ACT AEPB Inhale 2 puffs into the lungs every 4 (four) hours as needed. 3 each 3  . ALPRAZolam (XANAX) 1 MG tablet Take 1 daily as needed for anxiety 30 tablet 1  . D3-50 1.25 MG (50000 UT) capsule Take 50,000 Units by mouth once a week. Use as directed    . diltiazem (CARDIZEM CD) 240 MG 24 hr capsule Take 1 capsule (240 mg total) by mouth daily. 90 capsule 3  . EQ ASPIRIN ADULT LOW DOSE 81 MG EC tablet Take 1 tablet by mouth once daily 90 tablet 2  . meloxicam (MOBIC) 7.5 MG tablet Take 7.5 mg by mouth as needed for pain.     Marland Kitchen OVER THE COUNTER MEDICATION Stool softner daily    . SYMBICORT 160-4.5 MCG/ACT inhaler Inhale 2 puffs into the lungs 2 (two) times daily. 3 Inhaler 4   No current facility-administered medications for this visit.     Past Medical History:  Diagnosis Date  . Asthma    adult onset, confirmed with pre- and post-bronchodialator PFTs 06/13/2017 at PCP.  Marland Kitchen Menorrhagia 2014  . Osteoporosis   . Paroxysmal atrial fibrillation (HCC)   . Paroxysmal SVT (supraventricular tachycardia) (HCC)     ROS:   All systems reviewed and negative except as noted in the HPI.   History reviewed. No pertinent  surgical history.   Family History  Problem Relation Age of Onset  . Hypertension Mother   . Heart disease Mother        stent placement  . Hyperlipidemia Mother   . Diabetes Maternal Aunt   . Heart disease Maternal Grandmother      Social History   Socioeconomic History  . Marital status: Married    Spouse name: Not on file  . Number of children: Not on file  . Years of education: Not on file  . Highest education level: Not on file  Occupational History  . Not on file  Tobacco Use  . Smoking status: Former Smoker    Years: 20.00    Types: Cigarettes  . Smokeless tobacco: Never Used  . Tobacco comment: QUIT 2015  Vaping Use  . Vaping Use: Never used  Substance and Sexual Activity  . Alcohol use: No    Alcohol/week: 14.0 standard drinks    Types: 14 Standard drinks or equivalent per week  . Drug use: No  . Sexual activity: Yes    Partners: Male    Birth control/protection: Other-see comments    Comment: vasectomy, INTERCOURSE AGE 17SEXUAL PARTNERS LESS THAN 5  Other Topics Concern  . Not on file  Social History Narrative  . Not on file   Social Determinants of Health   Financial Resource  Strain:   . Difficulty of Paying Living Expenses: Not on file  Food Insecurity:   . Worried About Charity fundraiser in the Last Year: Not on file  . Ran Out of Food in the Last Year: Not on file  Transportation Needs:   . Lack of Transportation (Medical): Not on file  . Lack of Transportation (Non-Medical): Not on file  Physical Activity:   . Days of Exercise per Week: Not on file  . Minutes of Exercise per Session: Not on file  Stress:   . Feeling of Stress : Not on file  Social Connections:   . Frequency of Communication with Friends and Family: Not on file  . Frequency of Social Gatherings with Friends and Family: Not on file  . Attends Religious Services: Not on file  . Active Member of Clubs or Organizations: Not on file  . Attends Archivist Meetings:  Not on file  . Marital Status: Not on file  Intimate Partner Violence:   . Fear of Current or Ex-Partner: Not on file  . Emotionally Abused: Not on file  . Physically Abused: Not on file  . Sexually Abused: Not on file     BP 120/76   Pulse 65   Ht 5\' 6"  (1.676 m)   Wt 170 lb 6.4 oz (77.3 kg)   SpO2 92%   BMI 27.50 kg/m   Physical Exam:  Well appearing NAD HEENT: Unremarkable Neck:  No JVD, no thyromegally Lymphatics:  No adenopathy Back:  No CVA tenderness Lungs:  Clear with no wheezes HEART:  Regular rate rhythm, no murmurs, no rubs, no clicks Abd:  soft, positive bowel sounds, no organomegally, no rebound, no guarding Ext:  2 plus pulses, no edema, no cyanosis, no clubbing Skin:  No rashes no nodules Neuro:  CN II through XII intact, motor grossly intact  EKG - low atrial rhythm with a short PR   Assess/Plan: 1. SVT - I have discussed the treatment options with the patient and her husband. The indictions/risks/benefits/goals/expectations of EP study and catheter ablation were reviewed and she will call us if she wishes to proceed. 2. anixety - she has been taking benzos and I asked her to try and wean off of these meds.  Alexandra Overlie Rustyn Conery,MD

## 2020-06-20 ENCOUNTER — Telehealth: Payer: Self-pay

## 2020-06-20 DIAGNOSIS — I471 Supraventricular tachycardia, unspecified: Secondary | ICD-10-CM

## 2020-06-20 MED ORDER — DILTIAZEM HCL ER COATED BEADS 240 MG PO CP24: 240.0000 mg | ORAL_CAPSULE | Freq: Every day | ORAL | 11 refills | Status: DC

## 2020-06-20 NOTE — Telephone Encounter (Signed)
Call received from Pt.  Pt would like to schedule SVT ablation for 07/04/20 at 7:30 am.  Labs/covid test scheduled.  Need to ask if diltiazem should be held prior to procedure.

## 2020-06-21 NOTE — Telephone Encounter (Signed)
Confirmed Pt should hold diltiazem for 2 days prior to procedure.  Instruction letter sent via mychart.  Work up complete.

## 2020-07-01 ENCOUNTER — Other Ambulatory Visit: Payer: Self-pay

## 2020-07-01 ENCOUNTER — Other Ambulatory Visit: Payer: BC Managed Care – PPO | Admitting: *Deleted

## 2020-07-01 ENCOUNTER — Other Ambulatory Visit (HOSPITAL_COMMUNITY)
Admission: RE | Admit: 2020-07-01 | Discharge: 2020-07-01 | Disposition: A | Discharge status: Home / Self Care | Payer: BC Managed Care – PPO | Source: Ambulatory Visit | Attending: Internal Medicine | Admitting: Internal Medicine

## 2020-07-01 DIAGNOSIS — Z01812 Encounter for preprocedural laboratory examination: Secondary | ICD-10-CM | POA: Insufficient documentation

## 2020-07-01 DIAGNOSIS — Z20822 Contact with and (suspected) exposure to covid-19: Secondary | ICD-10-CM | POA: Insufficient documentation

## 2020-07-01 DIAGNOSIS — I471 Supraventricular tachycardia: Secondary | ICD-10-CM | POA: Diagnosis not present

## 2020-07-01 LAB — BASIC METABOLIC PANEL
BUN/Creatinine Ratio: 15 (ref 9–23)
BUN: 13 mg/dL (ref 6–24)
CO2: 23 mmol/L (ref 20–29)
Calcium: 10 mg/dL (ref 8.7–10.2)
Chloride: 104 mmol/L (ref 96–106)
Creatinine, Ser: 0.86 mg/dL (ref 0.57–1.00)
GFR calc Af Amer: 86 mL/min/{1.73_m2} (ref >59)
GFR calc non Af Amer: 75 mL/min/{1.73_m2} (ref >59)
Glucose: 84 mg/dL (ref 65–99)
Potassium: 3.9 mmol/L (ref 3.5–5.2)
Sodium: 143 mmol/L (ref 134–144)

## 2020-07-01 LAB — CBC WITH DIFFERENTIAL/PLATELET
Basophils Absolute: 0.1 10*3/uL (ref 0.0–0.2)
Basos: 1 %
EOS (ABSOLUTE): 0.1 10*3/uL (ref 0.0–0.4)
Eos: 2 %
Hematocrit: 43.2 % (ref 34.0–46.6)
Hemoglobin: 14.7 g/dL (ref 11.1–15.9)
Immature Grans (Abs): 0 10*3/uL (ref 0.0–0.1)
Immature Granulocytes: 0 %
Lymphocytes Absolute: 2.2 10*3/uL (ref 0.7–3.1)
Lymphs: 28 %
MCH: 32 pg (ref 26.6–33.0)
MCHC: 34 g/dL (ref 31.5–35.7)
MCV: 94 fL (ref 79–97)
Monocytes Absolute: 0.6 10*3/uL (ref 0.1–0.9)
Monocytes: 8 %
Neutrophils Absolute: 4.6 10*3/uL (ref 1.4–7.0)
Neutrophils: 61 %
Platelets: 319 10*3/uL (ref 150–450)
RBC: 4.59 x10E6/uL (ref 3.77–5.28)
RDW: 11.9 % (ref 11.7–15.4)
WBC: 7.6 10*3/uL (ref 3.4–10.8)

## 2020-07-01 LAB — SARS CORONAVIRUS 2 (TAT 6-24 HRS): SARS Coronavirus 2: NEGATIVE

## 2020-07-04 ENCOUNTER — Ambulatory Visit (HOSPITAL_COMMUNITY)
Admission: RE | Admit: 2020-07-04 | Discharge: 2020-07-04 | Disposition: A | Discharge status: Home / Self Care | Payer: BC Managed Care – PPO | Attending: Internal Medicine | Admitting: Internal Medicine

## 2020-07-04 ENCOUNTER — Encounter (HOSPITAL_COMMUNITY)
Admission: RE | Disposition: A | Discharge status: Home / Self Care | Payer: Self-pay | Source: Home / Self Care | Attending: Internal Medicine

## 2020-07-04 DIAGNOSIS — Z20822 Contact with and (suspected) exposure to covid-19: Secondary | ICD-10-CM | POA: Insufficient documentation

## 2020-07-04 DIAGNOSIS — I471 Supraventricular tachycardia: Secondary | ICD-10-CM | POA: Insufficient documentation

## 2020-07-04 HISTORY — PX: SVT ABLATION: EP1225

## 2020-07-04 SURGERY — SVT ABLATION

## 2020-07-04 MED ORDER — SODIUM CHLORIDE 0.9 % IV SOLN: INTRAVENOUS | Status: DC

## 2020-07-04 MED ORDER — MIDAZOLAM HCL 5 MG/5ML IJ SOLN
INTRAMUSCULAR | Status: AC
  Filled 2020-07-04: qty 5

## 2020-07-04 MED ORDER — SODIUM CHLORIDE 0.9% FLUSH: 3.0000 mL | INTRAVENOUS | Status: DC | PRN

## 2020-07-04 MED ORDER — ACETAMINOPHEN 325 MG PO TABS: 650.0000 mg | ORAL_TABLET | ORAL | Status: DC | PRN

## 2020-07-04 MED ORDER — FENTANYL CITRATE (PF) 100 MCG/2ML IJ SOLN
INTRAMUSCULAR | Status: AC
  Filled 2020-07-04: qty 2

## 2020-07-04 MED ORDER — BUPIVACAINE HCL (PF) 0.25 % IJ SOLN
INTRAMUSCULAR | Status: AC
  Filled 2020-07-04: qty 60

## 2020-07-04 MED ORDER — FENTANYL CITRATE (PF) 100 MCG/2ML IJ SOLN
INTRAMUSCULAR | Status: DC | PRN
  Administered 2020-07-04: 12.5 ug via INTRAVENOUS
  Administered 2020-07-04 (×7): 25 ug via INTRAVENOUS

## 2020-07-04 MED ORDER — ONDANSETRON HCL 4 MG/2ML IJ SOLN: 4.0000 mg | Freq: Four times a day (QID) | INTRAMUSCULAR | Status: DC | PRN

## 2020-07-04 MED ORDER — HEPARIN (PORCINE) IN NACL 1000-0.9 UT/500ML-% IV SOLN
INTRAVENOUS | Status: DC | PRN
  Administered 2020-07-04: 500 mL

## 2020-07-04 MED ORDER — SODIUM CHLORIDE 0.9 % IV SOLN
INTRAVENOUS | Status: DC | PRN
  Administered 2020-07-04: 4 ug/min via INTRAVENOUS

## 2020-07-04 MED ORDER — SODIUM CHLORIDE 0.9 % IV SOLN: 250.0000 mL | INTRAVENOUS | Status: DC | PRN

## 2020-07-04 MED ORDER — MIDAZOLAM HCL 5 MG/5ML IJ SOLN
INTRAMUSCULAR | Status: DC | PRN
  Administered 2020-07-04 (×5): 2 mg via INTRAVENOUS
  Administered 2020-07-04: 1 mg via INTRAVENOUS
  Administered 2020-07-04 (×3): 2 mg via INTRAVENOUS

## 2020-07-04 MED ORDER — HEPARIN (PORCINE) IN NACL 1000-0.9 UT/500ML-% IV SOLN
INTRAVENOUS | Status: AC
  Filled 2020-07-04: qty 500

## 2020-07-04 MED ORDER — BUPIVACAINE HCL (PF) 0.25 % IJ SOLN
INTRAMUSCULAR | Status: DC | PRN
  Administered 2020-07-04: 45 mL

## 2020-07-04 MED ORDER — ISOPROTERENOL HCL 0.2 MG/ML IJ SOLN
INTRAMUSCULAR | Status: AC
  Filled 2020-07-04: qty 5

## 2020-07-04 SURGICAL SUPPLY — 13 items
BAG SNAP BAND KOVER 36X36 (MISCELLANEOUS) ×2 IMPLANT
CATH EZ STEER NAV 4MM D-F CUR (ABLATOR) ×2 IMPLANT
CATH JOSEPH QUAD ALLRED 6F REP (CATHETERS) ×4 IMPLANT
CATH POLARIS X 2.5/5/2.5 DECAP (CATHETERS) ×2 IMPLANT
COVER DOME SNAP 22 D (MISCELLANEOUS) ×3 IMPLANT
PACK EP LATEX FREE (CUSTOM PROCEDURE TRAY) ×3
PACK EP LF (CUSTOM PROCEDURE TRAY) ×1 IMPLANT
PAD PRO RADIOLUCENT 2001M-C (PAD) ×3 IMPLANT
PATCH CARTO3 (PAD) ×2 IMPLANT
SHEATH PINNACLE 6F 10CM (SHEATH) ×4 IMPLANT
SHEATH PINNACLE 7F 10CM (SHEATH) ×2 IMPLANT
SHEATH PINNACLE 8F 10CM (SHEATH) ×2 IMPLANT
SHIELD RADPAD SCOOP 12X17 (MISCELLANEOUS) ×2 IMPLANT

## 2020-07-04 NOTE — Interval H&P Note (Signed)
History and Physical Interval Note:  07/04/2020 7:27 AM  Alexandra Grant  has presented today for surgery, with the diagnosis of svt.  The various methods of treatment have been discussed with the patient and family. After consideration of risks, benefits and other options for treatment, the patient has consented to  Procedure(s): SVT ABLATION (N/A) as a surgical intervention.  The patient's history has been reviewed, patient examined, no change in status, stable for surgery.  I have reviewed the patient's chart and labs.  Questions were answered to the patient's satisfaction.     Cristopher Peru

## 2020-07-04 NOTE — Progress Notes (Signed)
Site area: Right groin a 6 french X2 and 8 french venous sheaths were removed  Site Prior to Removal:  Level 0  Pressure Applied For 15 MINUTES    Bedrest Beginning at 1040am  Manual:   Yes.    Patient Status During Pull:  stable  Post Pull Groin Site:  Level 0  Post Pull Instructions Given:  Yes.    Post Pull Pulses Present:  Yes.    Dressing Applied:  Yes.    Comments:

## 2020-07-04 NOTE — Progress Notes (Signed)
Site area: Right Internal Jugular a 7 french venous sheath was removed  Site Prior to Removal:  Level 0  Pressure Applied For 5 MINUTES    Bedrest Beginning at 1040am  Manual:   Yes.    Patient Status During Pull:  stable  Post Pull Groin Site:  Level 0  Post Pull Instructions Given:  Yes.    Post Pull Pulses Present:  Yes.    Dressing Applied:  Yes.    Comments:

## 2020-07-04 NOTE — Discharge Instructions (Signed)
Post procedure care instructions No driving for 4 days. No lifting over 5 lbs for 1 week. No vigorous or sexual activity for 1 week. You may return to work/your usual activities on 07/12/2020. Keep procedure site clean & dry. If you notice increased pain, swelling, bleeding or pus, call/return!  You may shower, but no soaking baths/hot tubs/pools for 1 week.  Cardiac Ablation, Care After  This sheet gives you information about how to care for yourself after your procedure. Your health care provider may also give you more specific instructions. If you have problems or questions, contact your health care provider. What can I expect after the procedure? After the procedure, it is common to have:  Bruising around your puncture site.  Tenderness around your puncture site.  Skipped heartbeats.  Tiredness (fatigue).  Follow these instructions at home: Puncture site care   Follow instructions from your health care provider about how to take care of your puncture site. Make sure you: ? If present, leave stitches (sutures), skin glue, or adhesive strips in place. These skin closures may need to stay in place for up to 2 weeks. If adhesive strip edges start to loosen and curl up, you may trim the loose edges. Do not remove adhesive strips completely unless your health care provider tells you to do that. ? If a large square bandage is present, this may be removed 24 hours after surgery.   Check your puncture site every day for signs of infection. Check for: ? Redness, swelling, or pain. ? Fluid or blood. If your puncture site starts to bleed, lie down on your back, apply firm pressure to the area, and contact your health care provider. ? Warmth. ? Pus or a bad smell. Driving  Do not drive for at least 4 days after your procedure or however long your health care provider recommends. (Do not resume driving if you have previously been instructed not to drive for other health reasons.)  Do not drive  or use heavy machinery while taking prescription pain medicine. Activity  Avoid activities that take a lot of effort for at least 7 days after your procedure.  Do not lift anything that is heavier than 5 lb (4.5 kg) for one week.   No sexual activity for 1 week.   Return to your normal activities as told by your health care provider. Ask your health care provider what activities are safe for you. General instructions  Take over-the-counter and prescription medicines only as told by your health care provider.  Do not use any products that contain nicotine or tobacco, such as cigarettes and e-cigarettes. If you need help quitting, ask your health care provider.  You may shower after 24 hours, but Do not take baths, swim, or use a hot tub for 1 week.   Do not drink alcohol for 24 hours after your procedure.  Keep all follow-up visits as told by your health care provider. This is important. Contact a health care provider if:  You have redness, mild swelling, or pain around your puncture site.  You have fluid or blood coming from your puncture site that stops after applying firm pressure to the area.  Your puncture site feels warm to the touch.  You have pus or a bad smell coming from your puncture site.  You have a fever.  You have chest pain or discomfort that spreads to your neck, jaw, or arm.  You are sweating a lot.  You feel nauseous.  You have a  fast or irregular heartbeat.  You have shortness of breath.  You are dizzy or light-headed and feel the need to lie down.  You have pain or numbness in the arm or leg closest to your puncture site. Get help right away if:  Your puncture site suddenly swells.  Your puncture site is bleeding and the bleeding does not stop after applying firm pressure to the area. These symptoms may represent a serious problem that is an emergency. Do not wait to see if the symptoms will go away. Get medical help right away. Call your local  emergency services (911 in the [place] Do not drive yourself to the hospital. Summary  After the procedure, it is normal to have bruising and tenderness at the puncture site in your groin, neck, or forearm.  Check your puncture site every day for signs of infection.  Get help right away if your puncture site is bleeding and the bleeding does not stop after applying firm pressure to the area. This is a medical emergency. This information is not intended to replace advice given to you by your health care provider. Make sure you discuss any questions you have with your health care provider.

## 2020-07-05 ENCOUNTER — Encounter (HOSPITAL_COMMUNITY): Payer: Self-pay | Admitting: Internal Medicine

## 2020-07-13 MED ORDER — DILTIAZEM HCL ER COATED BEADS 240 MG PO CP24: 240.0000 mg | ORAL_CAPSULE | Freq: Every day | ORAL | 3 refills | Status: DC

## 2020-07-13 NOTE — Addendum Note (Signed)
Addended by: Willeen Cass A on: 07/13/2020 01:37 PM   Modules accepted: Orders

## 2020-08-09 ENCOUNTER — Other Ambulatory Visit: Payer: Self-pay

## 2020-08-09 ENCOUNTER — Ambulatory Visit (INDEPENDENT_AMBULATORY_CARE_PROVIDER_SITE_OTHER): Payer: BC Managed Care – PPO | Admitting: Internal Medicine

## 2020-08-09 ENCOUNTER — Encounter: Payer: Self-pay | Admitting: Internal Medicine

## 2020-08-09 VITALS — BP 114/70 | HR 66 | Ht 66.0 in | Wt 168.0 lb

## 2020-08-09 DIAGNOSIS — I471 Supraventricular tachycardia: Secondary | ICD-10-CM

## 2020-08-09 NOTE — Patient Instructions (Addendum)
Medication Instructions:  Your physician recommends that you continue on your current medications as directed. Please refer to the Current Medication list given to you today.  Labwork: None ordered.  Testing/Procedures: None ordered.  Follow-Up: Your physician wants you to follow-up in: as needed with Dr. Taylor.      Any Other Special Instructions Will Be Listed Below (If Applicable).  If you need a refill on your cardiac medications before your next appointment, please call your pharmacy.   

## 2020-08-09 NOTE — Progress Notes (Signed)
HPI Alexandra Grant returns today for ongoing evaluation and management of SVT.  She is a very pleasant 59 year old woman with a history of recurrent and documented symptomatic SVT who underwent EP study and catheter ablation several weeks ago.  At that time, we could not induce sustained SVT.  She did have echo beats and double echo beats noted.  She had no inducible atrial tachycardia, atrial flutter, atrial fibrillation, or AV reentrant tachycardia.  A slow pathway modification was carried out successfully.  Since then she has done well with no symptomatic palpitations or SVT.  She denies chest pain or shortness of breath.  No peripheral edema. No Known Allergies   Current Outpatient Medications  Medication Sig Dispense Refill  . Albuterol Sulfate (PROAIR RESPICLICK) 108 (90 Base) MCG/ACT AEPB Inhale 2 puffs into the lungs every 4 (four) hours as needed. 3 each 3  . ALPRAZolam (XANAX) 1 MG tablet Take 1 daily as needed for anxiety 30 tablet 1  . diltiazem (CARDIZEM CD) 240 MG 24 hr capsule Take 1 capsule (240 mg total) by mouth at bedtime. 90 capsule 3  . docusate sodium (COLACE) 100 MG capsule Take 100 mg by mouth at bedtime.    Burman Blacksmith ASPIRIN ADULT LOW DOSE 81 MG EC tablet Take 1 tablet by mouth once daily 90 tablet 2  . meloxicam (MOBIC) 7.5 MG tablet Take 7.5 mg by mouth daily as needed for pain.     . SYMBICORT 160-4.5 MCG/ACT inhaler Inhale 2 puffs into the lungs 2 (two) times daily. 3 Inhaler 4   No current facility-administered medications for this visit.     Past Medical History:  Diagnosis Date  . Asthma    adult onset, confirmed with pre- and post-bronchodialator PFTs 06/13/2017 at PCP.  Marland Kitchen Menorrhagia 2014  . Osteoporosis   . Paroxysmal atrial fibrillation (HCC)   . Paroxysmal SVT (supraventricular tachycardia) (HCC)     ROS:   All systems reviewed and negative except as noted in the HPI.   Past Surgical History:  Procedure Laterality Date  . SVT ABLATION N/A  07/04/2020   Procedure: SVT ABLATION;  Surgeon: Marinus Maw, MD;  Location: [facility] INVASIVE CV LAB;  Service: Cardiovascular;  Laterality: N/A;     Family History  Problem Relation Age of Onset  . Hypertension Mother   . Heart disease Mother        stent placement  . Hyperlipidemia Mother   . Diabetes Maternal Aunt   . Heart disease Maternal Grandmother      Social History   Socioeconomic History  . Marital status: Married    Spouse name: Not on file  . Number of children: Not on file  . Years of education: Not on file  . Highest education level: Not on file  Occupational History  . Not on file  Tobacco Use  . Smoking status: Former Smoker    Years: 20.00    Types: Cigarettes  . Smokeless tobacco: Never Used  . Tobacco comment: QUIT 2015  Vaping Use  . Vaping Use: Never used  Substance and Sexual Activity  . Alcohol use: No    Alcohol/week: 14.0 standard drinks    Types: 14 Standard drinks or equivalent per week  . Drug use: No  . Sexual activity: Yes    Partners: Male    Birth control/protection: Other-see comments    Comment: vasectomy, INTERCOURSE AGE 17SEXUAL PARTNERS LESS THAN 5  Other Topics Concern  . Not on file  Social History Narrative  . Not on file   Social Determinants of Health   Financial Resource Strain: Not on file  Food Insecurity: Not on file  Transportation Needs: Not on file  Physical Activity: Not on file  Stress: Not on file  Social Connections: Not on file  Intimate Partner Violence: Not on file     BP 114/70   Pulse 66   Ht 5\' 6"  (1.676 m)   Wt 168 lb (76.2 kg)   LMP 07/22/2013   SpO2 96%   BMI 27.12 kg/m   Physical Exam:  Well appearing middle-aged woman, NAD HEENT: Unremarkable Neck:  No JVD, no thyromegally Lymphatics:  No adenopathy Back:  No CVA tenderness Lungs:  Clear, with no wheezes, rales, or rhonchi HEART:  Regular rate rhythm, no murmurs, no rubs, no clicks Abd:  soft, positive bowel sounds, no  organomegally, no rebound, no guarding Ext:  2 plus pulses, no edema, no cyanosis, no clubbing Skin:  No rashes no nodules Neuro:  CN II through XII intact, motor grossly intact  EKG -normal sinus rhythm with normal axis and intervals  Assess/Plan: 1.  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  The patient has had no recurrent symptoms of SVT.  She will wean off of her Cardizem and call 14/05/2013 if she has any additional problems.  [place], MD

## 2020-08-16 ENCOUNTER — Telehealth: Payer: Self-pay | Admitting: Internal Medicine

## 2020-08-16 ENCOUNTER — Other Ambulatory Visit: Payer: Self-pay | Admitting: Physician Assistant

## 2020-08-16 NOTE — Telephone Encounter (Signed)
Patient's husband called and wanted to talk with Dr. Ladona Ridgel or nurse about the medication for his wife.

## 2020-08-16 NOTE — Telephone Encounter (Signed)
Pt's husband calling requesting a refill on diltiazem 30 mg tablets, for pt's rapid heart rate pt is experiencing. This medication is no longer on pt's medication list. Would Dr. Ladona Ridgel like to prescribe this medication again. Pt's husband would like a call back concerning this matter. They need this ASAP. Please address

## 2020-08-17 MED ORDER — DILTIAZEM HCL 30 MG PO TABS: 30.0000 mg | ORAL_TABLET | Freq: Three times a day (TID) | ORAL | 3 refills | Status: DC | PRN

## 2020-08-17 NOTE — Telephone Encounter (Signed)
Per Dr. Marshia Ly diltiazem 240 mg daily.  Will refill short acting diltiazem as previously ordered.

## 2020-08-25 ENCOUNTER — Telehealth (HOSPITAL_COMMUNITY): Payer: Self-pay | Admitting: Cardiology

## 2020-08-25 NOTE — Telephone Encounter (Signed)
error 

## 2020-08-31 ENCOUNTER — Ambulatory Visit: Payer: BC Managed Care – PPO | Admitting: Dermatology

## 2020-09-02 ENCOUNTER — Telehealth: Payer: Self-pay | Admitting: Family Medicine

## 2020-09-02 DIAGNOSIS — I471 Supraventricular tachycardia: Secondary | ICD-10-CM

## 2020-09-02 NOTE — Telephone Encounter (Signed)
Patient would like a referral to Dr. Loralie Champagne (cardiology). Patient was his patient just need a referral to re-establish care.

## 2020-09-02 NOTE — Telephone Encounter (Signed)
Referral placed.

## 2020-09-05 ENCOUNTER — Telehealth (HOSPITAL_COMMUNITY): Payer: Self-pay | Admitting: Cardiology

## 2020-09-05 NOTE — Telephone Encounter (Signed)
Pt called to f/u with referral from Silvestre Mesi, please advise

## 2020-09-15 DIAGNOSIS — G4700 Insomnia, unspecified: Secondary | ICD-10-CM | POA: Insufficient documentation

## 2020-10-25 NOTE — Telephone Encounter (Signed)
Pt not appropriate for [facility] clinic, ref sent back to Dr Edilia Bo

## 2020-10-28 ENCOUNTER — Other Ambulatory Visit: Payer: Self-pay

## 2020-10-28 ENCOUNTER — Ambulatory Visit (INDEPENDENT_AMBULATORY_CARE_PROVIDER_SITE_OTHER): Payer: BC Managed Care – PPO | Admitting: Physician Assistant

## 2020-10-28 ENCOUNTER — Encounter: Payer: Self-pay | Admitting: Physician Assistant

## 2020-10-28 VITALS — BP 134/84 | HR 63 | Ht 65.5 in | Wt 168.0 lb

## 2020-10-28 DIAGNOSIS — G4700 Insomnia, unspecified: Secondary | ICD-10-CM | POA: Diagnosis not present

## 2020-10-28 DIAGNOSIS — F411 Generalized anxiety disorder: Secondary | ICD-10-CM | POA: Diagnosis not present

## 2020-10-28 NOTE — Progress Notes (Signed)
[facility] MD/PA/NP Initial Note  10/28/2020 1:41 PM Alexandra Grant  MRN:  [id number]  Chief Complaint:  Chief Complaint    Establish Care      HPI: Here to discuss anxiety.   Patient is initially confused thinking I am a therapist, but after understanding my role she would like to proceed with the visit.  She would also like to set up an appointment with therapist.  States she has had anxiety for a while but is usually very happy person.  She does have generalized anxiety where she feels on age, like something bad may happen.  This is not a daily occurrence.  She has been on Xanax for an unknown length of time, for evening use only to help her relax and go to sleep.  Her gynecologist has been prescribing that but they are retiring and she is having trouble finding another provider that would prescribe her that medication.  Not having panic attacks but does have palpitations from time to time.  She has known SVT and paroxysmal atrial fibrillation that causes palpitations.  Denies drinking caffeine at all now.  As long as she takes his Xanax, she sleeps well.  Patient denies loss of interest in usual activities and is able to enjoy things.  Denies decreased energy or motivation.  Appetite has not changed.  No extreme sadness, tearfulness, or feelings of hopelessness.  Gets distracted easily at times.  Denies suicidal or homicidal thoughts.  Patient denies increased energy with decreased need for sleep, no increased talkativeness, occasional racing thoughts, no impulsivity or risky behaviors, no increased spending, no increased libido, no grandiosity, no increased irritability or anger, no paranoia, and no hallucinations.  Visit Diagnosis:    ICD-10-CM   1. Generalized anxiety disorder  F41.1   2. Insomnia, unspecified type  G47.00     Past Psychiatric History:   No psych hx. "I'm a very happy person." Has been on xanax at night only per her GYN, to help her sleep. No h/o self-harm. No  suicide attempt. No h/o eating disorder.   Past medications for mental health diagnoses include: Melatonin, Xanax  Past Medical History:  Past Medical History:  Diagnosis Date  . Asthma    adult onset, confirmed with pre- and post-bronchodialator PFTs 06/13/2017 at PCP.  Marland Kitchen Menorrhagia 2014  . Osteoporosis   . Paroxysmal atrial fibrillation (HCC)   . Paroxysmal SVT (supraventricular tachycardia) (HCC)     Past Surgical History:  Procedure Laterality Date  . SVT ABLATION N/A 07/04/2020   Procedure: SVT ABLATION;  Surgeon: Evans Lance, MD;  Location: [facility];  Service: Cardiovascular;  Laterality: N/A;    Family Psychiatric History:  See below  Family History:  Family History  Problem Relation Age of Onset  . Hypertension Mother   . Heart disease Mother        stent placement  . Hyperlipidemia Mother   . Diabetes Maternal Aunt   . Heart disease Maternal Grandmother     Social History:  Social History   Socioeconomic History  . Marital status: Married    Spouse name: Not on file  . Number of children: Not on file  . Years of education: Not on file  . Highest education level: Not on file  Occupational History  . Occupation: Network engineer    Comment: [employer] for 25 years  Tobacco Use  . Smoking status: Former Smoker    Years: 20.00    Types: Cigarettes  . Smokeless tobacco: Never  Used  . Tobacco comment: QUIT 2015  Vaping Use  . Vaping Use: Never used  Substance and Sexual Activity  . Alcohol use: No  . Drug use: Yes    Frequency: 1.0 times per week    Types: Marijuana  . Sexual activity: Yes    Partners: Male    Birth control/protection: Other-see comments    Comment: vasectomy, INTERCOURSE AGE 17SEXUAL PARTNERS LESS THAN 5  Other Topics Concern  . Not on file  Social History Narrative   Married since 1985. Has 2 daughter and 2 granddaughters. Husband is retired Audiological scientist.   Grew up in [place]. Her dad died before she was born. Her stepdad  adopted her, had a great childhood. No h/o abuse. Dad was a used Teacher, early years/pre. Mom was an Garment/textile technologist.       Caffeine-none now.   Legal- none   Religious-Christian   Social Determinants of Health   Financial Resource Strain: Low Risk   . Difficulty of Paying Living Expenses: Not hard at all  Food Insecurity: No Food Insecurity  . Worried About Charity fundraiser in the Last Year: Never true  . Ran Out of Food in the Last Year: Never true  Transportation Needs: No Transportation Needs  . Lack of Transportation (Medical): No  . Lack of Transportation (Non-Medical): No  Physical Activity: Inactive  . Days of Exercise per Week: 0 days  . Minutes of Exercise per Session: 0 min  Stress: Stress Concern Present  . Feeling of Stress : Rather much  Social Connections: Moderately Integrated  . Frequency of Communication with Friends and Family: More than three times a week  . Frequency of Social Gatherings with Friends and Family: More than three times a week  . Attends Religious Services: More than 4 times per year  . Active Member of Clubs or Organizations: No  . Attends Archivist Meetings: Never  . Marital Status: Married    Allergies: No Known Allergies  Metabolic Disorder Labs: Lab Results  Component Value Date   HGBA1C 5.6 01/25/2020   MPG 108 12/25/2016   Lab Results  Component Value Date   PROLACTIN 12.8 04/15/2013   Lab Results  Component Value Date   CHOL 206 (H) 01/25/2020   TRIG 70.0 01/25/2020   HDL 78.40 01/25/2020   CHOLHDL 3 01/25/2020   VLDL 14.0 01/25/2020   LDLCALC 114 (H) 01/25/2020   LDLCALC 84 12/25/2016   Lab Results  Component Value Date   TSH 0.449 04/23/2016   TSH 0.588 09/23/2014    Therapeutic Level Labs: No results found for: LITHIUM No results found for: VALPROATE No components found for:  CBMZ  Current Medications: Current Outpatient Medications  Medication Sig Dispense Refill  . Albuterol Sulfate (PROAIR RESPICLICK) 366  (90 Base) MCG/ACT AEPB Inhale 2 puffs into the lungs every 4 (four) hours as needed. 3 each 3  . ALPRAZolam (XANAX) 1 MG tablet Take 1 daily as needed for anxiety (Patient taking differently: Take 0.5 mg by mouth at bedtime.) 30 tablet 1  . diltiazem (CARDIZEM) 30 MG tablet TAKE 1 TABLET BY MOUTH THREE TIMES DAILY AS NEEDED AT ONSET OF ATRIAL FIBRILLATION / FAST  HEART RATE. 30 tablet 0  . diltiazem (CARDIZEM) 30 MG tablet Take 1 tablet (30 mg total) by mouth 3 (three) times daily as needed. At onset of fast heart rate 90 tablet 3  . docusate sodium (COLACE) 100 MG capsule Take 100 mg by mouth at bedtime.    Marland Kitchen  meloxicam (MOBIC) 7.5 MG tablet Take 7.5 mg by mouth daily as needed for pain.     . SYMBICORT 160-4.5 MCG/ACT inhaler Inhale 2 puffs into the lungs 2 (two) times daily. 3 Inhaler 4  . diltiazem (CARDIZEM CD) 240 MG 24 hr capsule Take 1 capsule (240 mg total) by mouth at bedtime. 90 capsule 3  . EQ ASPIRIN ADULT LOW DOSE 81 MG EC tablet Take 1 tablet by mouth once daily (Patient not taking: Reported on 10/28/2020) 90 tablet 2   No current facility-administered medications for this visit.    Medication Side Effects: none  Orders placed this visit:  No orders of the defined types were placed in this encounter.   Psychiatric Specialty Exam:  Review of Systems  Constitutional: Negative.   HENT: Negative.   Eyes: Negative.   Respiratory: Positive for shortness of breath.        Orthopnea  Cardiovascular: Positive for palpitations.  Endocrine: Negative.   Genitourinary: Negative.   Musculoskeletal: Negative.   Skin: Negative.   Allergic/Immunologic: Negative.   Neurological: Negative.   Hematological: Negative.   Psychiatric/Behavioral: Positive for sleep disturbance. The patient is nervous/anxious.     Blood pressure 134/84, pulse 63, height 5' 5.5" (1.664 m), weight 168 lb (76.2 kg), last menstrual period 07/22/2013.Body mass index is 27.53 kg/m.  General Appearance: Casual,  Neat, Well Groomed and Obese  Eye Contact:  Good  Speech:  Clear and Coherent and Normal Rate  Volume:  Normal  Mood:  Depressed  Affect:  Depressed and Anxious  Thought Process:  Goal Directed and Descriptions of Associations: Intact  Orientation:  Full (Time, Place, and Person)  Thought Content: Logical   Suicidal Thoughts:  No  Homicidal Thoughts:  No  Memory:  WNL  Judgement:  Good  Insight:  Good  Psychomotor Activity:  Normal  Concentration:  Concentration: Good  Recall:  Good  Fund of Knowledge: Good  Language: Good  Assets:  Desire for Improvement  ADL's:  Intact  Cognition: WNL  Prognosis:  Good   Screenings:  GAD-7   Flowsheet Row Office Visit from 10/28/2020 in [facility]  Total GAD-7 Score 20    PHQ2-9   [facility] Office Visit from 10/28/2020 in [facility] from 01/08/2019 in [facility] at [facility] from 05/27/2018 in Primary [facility] at [facility] from 09/09/2017 in [facility] from 07/08/2017 in [facility] at [facility] Total Score 0 0 0 0 0      Receiving Psychotherapy: No   Treatment Plan/Recommendations:  PDMP reviewed. I provided 65 minutes of face-to-face time during this encounter, including time spent before and after the visit reviewing her records and charting. I strongly recommend an SSRI because it will help with anxiety and any depression that she may have.  That is not her primary diagnosis but I feel sure some of what she is feeling mentally is due to depression, which is very common in women with heart disease.  We need to try a medication to help prevent the anxiety, not simply put a Band-Aid on the problem by using Xanax alone.  She is hesitant to take an SSRI, having heard many bad things about that class of drugs or seeing friends who have taken one of them and do not like the way it made them feel.  Another option would be BuSpar which by the end of  our visit we agreed to try, but she  wants to read about it first.  We discussed the benefits and risks, side effects and decided to leave the plan as that she will call and let me know if she wants me to send that in.  Again I am not going to prescribe Xanax only, we can treat the anxiety and difficulty going to sleep in a different way than just using Xanax alone.  She has enough Xanax for now, given by another provider. Start BuSpar if she calls back saying she would like to.  Refer to one of our therapists here in the office. Return in 2 months.  Donnal Moat, PA-C

## 2020-10-30 ENCOUNTER — Encounter: Payer: Self-pay | Admitting: Physician Assistant

## 2020-10-31 ENCOUNTER — Other Ambulatory Visit: Payer: Self-pay

## 2020-10-31 ENCOUNTER — Encounter: Payer: Self-pay | Admitting: Dermatology

## 2020-10-31 ENCOUNTER — Ambulatory Visit: Payer: BC Managed Care – PPO | Admitting: Dermatology

## 2020-10-31 DIAGNOSIS — I471 Supraventricular tachycardia: Secondary | ICD-10-CM

## 2020-10-31 DIAGNOSIS — Z1283 Encounter for screening for malignant neoplasm of skin: Secondary | ICD-10-CM | POA: Diagnosis not present

## 2020-10-31 DIAGNOSIS — L729 Follicular cyst of the skin and subcutaneous tissue, unspecified: Secondary | ICD-10-CM | POA: Diagnosis not present

## 2020-10-31 DIAGNOSIS — L821 Other seborrheic keratosis: Secondary | ICD-10-CM

## 2020-10-31 DIAGNOSIS — R002 Palpitations: Secondary | ICD-10-CM

## 2020-11-02 ENCOUNTER — Other Ambulatory Visit: Payer: Self-pay | Admitting: Physician Assistant

## 2020-11-02 ENCOUNTER — Ambulatory Visit (INDEPENDENT_AMBULATORY_CARE_PROVIDER_SITE_OTHER): Payer: BC Managed Care – PPO

## 2020-11-02 ENCOUNTER — Telehealth: Payer: Self-pay | Admitting: Physician Assistant

## 2020-11-02 DIAGNOSIS — I471 Supraventricular tachycardia: Secondary | ICD-10-CM

## 2020-11-02 DIAGNOSIS — G4709 Other insomnia: Secondary | ICD-10-CM

## 2020-11-02 DIAGNOSIS — R002 Palpitations: Secondary | ICD-10-CM

## 2020-11-02 MED ORDER — BUSPIRONE HCL 15 MG PO TABS: ORAL_TABLET | ORAL | 0 refills | Status: DC

## 2020-11-02 MED ORDER — ALPRAZOLAM 1 MG PO TABS: 0.5000 mg | ORAL_TABLET | Freq: Every evening | ORAL | 1 refills | Status: DC | PRN

## 2020-11-02 NOTE — Telephone Encounter (Signed)
Please review

## 2020-11-02 NOTE — Telephone Encounter (Signed)
Prescriptions were sent

## 2020-11-02 NOTE — Telephone Encounter (Signed)
Patient called in today stating that she was recently seen and that she discussed with TH starting Buspar and Xanax and that she wasn't sure which pharmacy she would be using. She would like prescription for Buspar and Xanax sent in. Pls call if any questions. Pharmacy [facility] [phone] [place]

## 2020-11-05 DIAGNOSIS — I471 Supraventricular tachycardia: Secondary | ICD-10-CM

## 2020-11-05 DIAGNOSIS — R002 Palpitations: Secondary | ICD-10-CM

## 2020-11-08 ENCOUNTER — Encounter: Payer: Self-pay | Admitting: Dermatology

## 2020-11-08 NOTE — Progress Notes (Signed)
   Follow-Up Visit   Subjective  Alexandra Grant is a 60 y.o. female who presents for the following: Cyst (Had cyst removed x 1 month ago at dr luptons office- seems red & irritated when I made appointment- seems to be getting better).  General skin check, 1 cyst on lower abdomen examined. Location:  Duration:  Quality:  Associated Signs/Symptoms: Modifying Factors:  Severity:  Timing: Context:   Objective  Well appearing patient in no apparent distress; mood and affect are within normal limits. Objective  Right Buccal Cheek: Flattopped textured 5 mm tan papule   Objective  Right Abdomen (side) - Lower: Nonfluctuant 1 cm deep dermal nodule with epidermal open pore.  Objective  skin check waist up: No atypical moles, no skin cancer    All skin waist up examined.  Areas beneath undergarments not fully examined.   Assessment & Plan    Seborrheic keratosis Right Buccal Cheek  Leave if stable  Cyst of skin Right Abdomen (side) - Lower  Discussed elective removal.  [name] prefers for now to leave the spot unless there is clinical change.  Screening exam for skin cancer skin check waist up  Yearly skin check      I, Alexandra Monarch, MD, have reviewed all documentation for this visit.  The documentation on 11/08/20 for the exam, diagnosis, procedures, and orders are all accurate and complete.

## 2020-11-10 ENCOUNTER — Ambulatory Visit (INDEPENDENT_AMBULATORY_CARE_PROVIDER_SITE_OTHER): Payer: BC Managed Care – PPO | Admitting: Psychiatry

## 2020-11-10 DIAGNOSIS — F411 Generalized anxiety disorder: Secondary | ICD-10-CM

## 2020-11-10 NOTE — Progress Notes (Addendum)
[facility] Counselor Initial Adult Exam  Name: Alexandra Grant Date: 11/10/2020 MRN: [id number] DOB: November 22, 1960 PCP: Darreld Mclean, MD  Time spent: 60 minutes    2:55pm to 3:55pm  Virtual Visit via MyChart Note Connected with patient by a video enabled telemedicine/telehealth application or telephone, with their informed consent, and verified patient privacy and that I am speaking with the correct person using two identifiers. I discussed the limitations, risks, security and privacy concerns of performing psychotherapy and management service by telephone and the availability of in person appointments. I also discussed with the patient that there may be a patient responsible charge related to this service. The patient expressed understanding and agreed to proceed. I discussed the treatment planning with the patient. The patient was provided an opportunity to ask questions and all were answered. The patient agreed with the plan and demonstrated an understanding of the instructions. The patient was advised to call  our office if  symptoms worsen or feel they are in a crisis state and need immediate contact.   Therapist Location: [facility] Patient Location: patient's home   Guardian/Payee:  patient   Paperwork requested:  No   Reason for Visit /Presenting Problem: Anxiety, some depression "but not long-term"  Mental Status Exam:   Appearance:   Casual     Behavior:  Appropriate, Sharing and Motivated  Motor:  Normal  Speech/Language:   Clear and Coherent  Affect:  Anxiety, some depression  Mood:  anxious and some depression  Thought process:  normal  Thought content:    WNL  Sensory/Perceptual disturbances:    WNL  Orientation:  oriented to person, place, time/date, situation, day of week, month of year and year  Attention:  Good  Concentration:  Good  Memory:  WNL  Fund of knowledge:   Good  Insight:    Good  Judgment:   Good  Impulse Control:  Good   Reported  Symptoms:  See symptoms above.  Risk Assessment: Danger to Self:  No Self-injurious Behavior: No Danger to Others: No Duty to Warn:no Physical Aggression / Violence:No  Access to Firearms a concern: No  Gang Involvement:No  Patient / guardian was educated about steps to take if suicide or homicide risk level increases between visits: Denies any SI.  While future psychiatric events cannot be accurately predicted, the patient does not currently require acute inpatient psychiatric care and does not currently meet [facility] involuntary commitment criteria.  Substance Abuse History: Current substance abuse: No     Past Psychiatric History:   No previous psychological problems have been observed Outpatient Providers:n/a History of Psych Hospitalization: No  Psychological Testing: n/a   Abuse History: Victim of No., n/a   Report needed: No. Victim of Neglect:No. Perpetrator of n/a  Witness / Exposure to Domestic Violence: No   Protective Services Involvement: No  Witness to [employer] Violence:  No   Family History: Reviewed with patient and she confirms info below. Family History  Problem Relation Age of Onset  . Hypertension Mother   . Heart disease Mother        stent placement  . Hyperlipidemia Mother   . Diabetes Maternal Aunt   . Heart disease Maternal Grandmother     Living situation: the patient lives with their spouse  Sexual Orientation:  Straight  Relationship Status: married 46 yrs  Name of spouse / other: N/A             If a parent, number of children / ages:  2 adult girls (30 and 42). Both live close by and they have good relationships with both of them.  Support Systems; spouse Friends, parents, daughters, Large family support  Financial Stress:  No   Income/Employment/Disability: Employment  Armed forces logistics/support/administrative officer: No   Educational History: Education: Scientist, product/process development:   Protestant ; involved in faith  community  Any cultural differences that may affect / interfere with treatment:  not applicable   Recreation/Hobbies: working on their farm, playing with their dog, being with family and friends  Stressors:Health problems  (heart related, testing done) Is on meds to help keep heart from beating rapidly.  Strengths:  Supportive Relationships, Family, Friends, [place], Spirituality, Hopefulness and Able to Communicate Effectively  Barriers:   "Myself,  my mind just goes crazy and I stay overly focused on things that stress me, for example fearful things like the situation in [place]."  Legal History: Pending legal issue / charges: The patient has no significant history of legal issues. History of legal issue / charges: n/a  Medical History/Surgical History: Reviewed and patient confirmed info below. Past Medical History:  Diagnosis Date  . Asthma    adult onset, confirmed with pre- and post-bronchodialator PFTs 06/13/2017 at PCP.  Marland Kitchen Menorrhagia 2014  . Osteoporosis   . Paroxysmal atrial fibrillation (HCC)   . Paroxysmal SVT (supraventricular tachycardia) (HCC)     Past Surgical History:  Procedure Laterality Date  . SVT ABLATION N/A 07/04/2020   Procedure: SVT ABLATION;  Surgeon: Evans Lance, MD;  Location: [facility];  Service: Cardiovascular;  Laterality: N/A;    Medications: Reviewed and patient confirmed info below. Current Outpatient Medications  Medication Sig Dispense Refill  . Albuterol Sulfate (PROAIR RESPICLICK) 741 (90 Base) MCG/ACT AEPB Inhale 2 puffs into the lungs every 4 (four) hours as needed. 3 each 3  . ALPRAZolam (XANAX) 1 MG tablet Take 0.5 tablets (0.5 mg total) by mouth at bedtime as needed. 30 tablet 1  . busPIRone (BUSPAR) 15 MG tablet 1/3 po bid for 1 week, then 2/3 po bid for 1 week, then 1 po bid. 60 tablet 0  . diltiazem (CARDIZEM CD) 240 MG 24 hr capsule Take 1 capsule (240 mg total) by mouth at bedtime. 90 capsule 3  . diltiazem  (CARDIZEM) 30 MG tablet TAKE 1 TABLET BY MOUTH THREE TIMES DAILY AS NEEDED AT ONSET OF ATRIAL FIBRILLATION / FAST  HEART RATE. 30 tablet 0  . diltiazem (CARDIZEM) 30 MG tablet Take 1 tablet (30 mg total) by mouth 3 (three) times daily as needed. At onset of fast heart rate 90 tablet 3  . docusate sodium (COLACE) 100 MG capsule Take 100 mg by mouth at bedtime.    Noelle Penner ASPIRIN ADULT LOW DOSE 81 MG EC tablet Take 1 tablet by mouth once daily (Patient not taking: Reported on 10/31/2020) 90 tablet 2  . meloxicam (MOBIC) 7.5 MG tablet Take 7.5 mg by mouth daily as needed for pain.     . SYMBICORT 160-4.5 MCG/ACT inhaler Inhale 2 puffs into the lungs 2 (two) times daily. 3 Inhaler 4   No current facility-administered medications for this visit.    No Known Allergies  Diagnoses:    ICD-10-CM   1. Generalized anxiety disorder  F41.1        Plan of Care:  Patient not signing treatment plan on computer screen due to COVID.  Treatment goals: Treatment goals remain on treatment plan as patient works with strategies to achieve  her goals.  Progress will be assessed each session and documented in the "progress" section of note.  Long-term goal: Reduce overall level, frequency, and intensity of the anxiety so that daily functioning is not impaired. Patient to practice positive self-talk daily.  Short-term goal: Verbalize an understanding of the role that fearful thinking plays in creating fearful thoughts, excessive worrying, and persistent anxiety symptoms.  Strategies: Teach patient to implement thought stopping technique to interrupt anxious/negative/fearful thoughts, and replace with more reality-based and empowering thoughts.     Progress: This is first therapy appointment for patient and we completed her initial evaluation for therapy and treatment goal plan collaboratively.  She is a 60 yr old married female, married for 61 yrs, and works at [employer] to retire end of July 2022.  Has 2  adult daughters that live close by and are close in their relationship with each other and patient.  No prior behavioral health treatment other than some marital therapy in her past.  Patient describes her marriage as healthy and much better after some prior marital counseling. Husband already retired. Feels that menopause has affected her mood some and led to some panic and anxiety, and also have lost a lot of family members over past few years. Still has some panic attacks but feels her problem in more of her anxiety. Is still hesitant in going out to stores partly due to Covid but more due to anxiety and potential panic---"sometimes has panic going in to church" and aware of how "messages in her head" can contribute to that. Patient states "I'm excited to start therapy and better understand my thinking better and learn to think more logically, and make some needed changes in my life and ways of managing stress and anxiety." Patient motivated and to be seen again in approx. 2 weeks.   Review of goals again with patient and she agrees they are appropriate for her needs.  Next appointment within 2 weeks.   Shanon Ace, LCSW

## 2020-11-15 ENCOUNTER — Ambulatory Visit (INDEPENDENT_AMBULATORY_CARE_PROVIDER_SITE_OTHER): Payer: BC Managed Care – PPO | Admitting: Psychiatry

## 2020-11-15 ENCOUNTER — Other Ambulatory Visit: Payer: Self-pay

## 2020-11-15 DIAGNOSIS — F411 Generalized anxiety disorder: Secondary | ICD-10-CM

## 2020-11-15 NOTE — Progress Notes (Signed)
[facility] Counselor/Therapist Progress Note  Patient ID: Alexandra Grant, MRN: [id number],    Date: 11/15/2020  Time Spent: 60 minutes   4:00pm to 5:00pm  Treatment Type: Individual Therapy  Reported Symptoms: anxiety  Mental Status Exam:  Appearance:   Casual     Behavior:  Appropriate, Sharing and Motivated  Motor:  Normal  Speech/Language:   Clear and Coherent  Affect:  anxious  Mood:  anxious  Thought process:  goal directed  Thought content:    WNL  Sensory/Perceptual disturbances:    WNL  Orientation:  oriented to person, place, time/date, situation, day of week, month of year and year  Attention:  Good  Concentration:  Good  Memory:  WNL  Fund of knowledge:   Good  Insight:    Good  Judgment:   Good  Impulse Control:  Good   Risk Assessment: Danger to Self:  No Self-injurious Behavior: No Danger to Others: No Duty to Warn:no Physical Aggression / Violence:No  Access to Firearms a concern: No  Gang Involvement:No   Subjective: Patient today reporting increased anxiety.  Feels it's been stronger over past 4-5 yrs.  States her anxiety is making her feel more depressed. Fears about her heart issues. Med management per Ellis Savage, PA-C. (See Progress Note below.)  Interventions: Cognitive Behavioral Therapy and Solution-Oriented/Positive Psychology  Diagnosis:   ICD-10-CM   1. Generalized anxiety disorder  F41.1      Plan of Care:  Patient not signing treatment plan on computer screen due to COVID.  Treatment goals: Treatment goals remain on treatment plan as patient works with strategies to achieve her goals.  Progress will be assessed each session and documented in the "progress" section of note.  Long-term goal: Reduce overall level, frequency, and intensity of the anxiety so that daily functioning is not impaired. Patient to practice positive self-talk daily.  Short-term goal: Verbalize an understanding of the role that fearful thinking plays  in creating fearful thoughts, excessive worrying, and persistent anxiety symptoms.  Strategies: Teach patient to implement thought stopping technique to interrupt anxious/negative/fearful thoughts, and replace with more reality-based and empowering thoughts.     Progress: Patient in today reporting increasing anxiety "which is causing me to have some depression" and concerns. Denies any SI, but "I am scared that I'm going to have an attack somewhere and won't be able to get help, am more fearful in crowd that something could happen with my heart." Has had recent appt with cardiologist and has been wearing a heart monitor and set to return to Dr on April 14th. Lots of anxious/fearful thoughts and we worked on this in session today, per short term goal and strategies in tx plan above.  Was able to work more concentrated on recognizing anxious/fearful thoughts more quickly and begin to challenge them and replace them with more reality-based thoughts that can be more empowering and do not support anxiety.  Patient is to continue working on this between sessions and will be seeing her cardiologist on April 14 for results from prior testing.  Encourage patient to stay in the present versus the past or future, to focus on the things she can control or change, to practice good nutrition and getting enough sleep, to stay in touch with people who are supportive of her, to get outside some every day, to practice positive self-care and self talk, and to feel good about the strength she is showing to address her anxiety as she works to  move forward in a more positive direction.  We will review and progress/challenges noted with patient.  Next appointment within 2 weeks.   Shanon Ace, LCSW

## 2020-11-24 ENCOUNTER — Other Ambulatory Visit: Payer: Self-pay

## 2020-11-24 ENCOUNTER — Ambulatory Visit: Payer: BC Managed Care – PPO | Admitting: Internal Medicine

## 2020-11-24 ENCOUNTER — Encounter: Payer: Self-pay | Admitting: Internal Medicine

## 2020-11-24 VITALS — BP 112/74 | HR 68 | Ht 65.5 in | Wt 167.8 lb

## 2020-11-24 DIAGNOSIS — I480 Paroxysmal atrial fibrillation: Secondary | ICD-10-CM

## 2020-11-24 DIAGNOSIS — I471 Supraventricular tachycardia: Secondary | ICD-10-CM

## 2020-11-24 DIAGNOSIS — R002 Palpitations: Secondary | ICD-10-CM | POA: Diagnosis not present

## 2020-11-24 MED ORDER — FLECAINIDE ACETATE 100 MG PO TABS: ORAL_TABLET | ORAL | 1 refills | Status: DC

## 2020-11-24 NOTE — Patient Instructions (Addendum)
Medication Instructions:  Your physician has recommended you make the following change in your medication:   1.  START taking flecainide 100 mg--Take one tablet by mouth as needed for heart racing.  May take up to 2 tablets in 24 hours.  Labwork: None ordered.  Testing/Procedures: None ordered.  Follow-Up: Your physician wants you to follow-up in:6  months with Cristopher Peru, MD or one of the following Advanced Practice Providers on your designated Care Team:    Chanetta Marshall, NP  Tommye Standard, PA-C  Legrand Como "Jonni Sanger" Chalmers Cater, [place]   Any Other Special Instructions Will Be Listed Below (If Applicable).  If you need a refill on your cardiac medications before your next appointment, please call your pharmacy.   Flecainide tablets What is this medicine? FLECAINIDE (FLEK a nide) is an antiarrhythmic drug. This medicine is used to prevent irregular heart rhythm. It can also slow down fast heartbeats called tachycardia. This medicine may be used for other purposes; ask your health care provider or pharmacist if you have questions. COMMON BRAND NAME(S): Tambocor What should I tell my health care provider before I take this medicine? They need to know if you have any of these conditions:  abnormal levels of potassium in the blood  heart disease including heart rhythm and heart rate problems  kidney or liver disease  recent heart attack  an unusual or allergic reaction to flecainide, local anesthetics, other medicines, foods, dyes, or preservatives  pregnant or trying to get pregnant  breast-feeding How should I use this medicine? Take this medicine by mouth with a glass of water. Follow the directions on the prescription label. You can take this medicine with or without food. Take your doses at regular intervals. Do not take your medicine more often than directed. Do not stop taking this medicine suddenly. This may cause serious, heart-related side effects. If your doctor wants you to  stop the medicine, the dose may be slowly lowered over time to avoid any side effects. Talk to your pediatrician regarding the use of this medicine in children. While this drug may be prescribed for children as young as 1 year of age for selected conditions, precautions do apply. Overdosage: If you think you have taken too much of this medicine contact a poison control center or emergency room at once. NOTE: This medicine is only for you. Do not share this medicine with others. What if I miss a dose? If you miss a dose, take it as soon as you can. If it is almost time for your next dose, take only that dose. Do not take double or extra doses. What may interact with this medicine? Do not take this medicine with any of the following medications:  amoxapine  arsenic trioxide  certain antibiotics like clarithromycin, erythromycin, gatifloxacin, gemifloxacin, levofloxacin, moxifloxacin, sparfloxacin, or troleandomycin  certain antidepressants called tricyclic antidepressants like amitriptyline, imipramine, or nortriptyline  certain medicines to control heart rhythm like disopyramide, encainide, moricizine, procainamide, propafenone, and quinidine  cisapride  delavirdine  droperidol  haloperidol  hawthorn  imatinib  levomethadyl  maprotiline  medicines for malaria like chloroquine and halofantrine  pentamidine  phenothiazines like chlorpromazine, mesoridazine, prochlorperazine, thioridazine  pimozide  quinine  ranolazine  ritonavir  sertindole This medicine may also interact with the following medications:  cimetidine  dofetilide  medicines for angina or high blood pressure  medicines to control heart rhythm like amiodarone and digoxin  ziprasidone This list may not describe all possible interactions. Give your health care provider a list  of all the medicines, herbs, non-prescription drugs, or dietary supplements you use. Also tell them if you smoke, drink  alcohol, or use illegal drugs. Some items may interact with your medicine. What should I watch for while using this medicine? Visit your doctor or health care professional for regular checks on your progress. Because your condition and the use of this medicine carries some risk, it is a good idea to carry an identification card, necklace or bracelet with details of your condition, medications and doctor or health care professional. Check your blood pressure and pulse rate regularly. Ask your health care professional what your blood pressure and pulse rate should be, and when you should contact him or her. Your doctor or health care professional also may schedule regular blood tests and electrocardiograms to check your progress. You may get drowsy or dizzy. Do not drive, use machinery, or do anything that needs mental alertness until you know how this medicine affects you. Do not stand or sit up quickly, especially if you are an older patient. This reduces the risk of dizzy or fainting spells. Alcohol can make you more dizzy, increase flushing and rapid heartbeats. Avoid alcoholic drinks. What side effects may I notice from receiving this medicine? Side effects that you should report to your doctor or health care professional as soon as possible:  chest pain, continued irregular heartbeats  difficulty breathing  swelling of the legs or feet  trembling, shaking  unusually weak or tired Side effects that usually do not require medical attention (report to your doctor or health care professional if they continue or are bothersome):  blurred vision  constipation  headache  nausea, vomiting  stomach pain This list may not describe all possible side effects. Call your doctor for medical advice about side effects. You may report side effects to FDA at 1-800-FDA-1088. Where should I keep my medicine? Keep out of the reach of children. Store at room temperature between 15 and 30 degrees C (59  and 86 degrees F). Protect from light. Keep container tightly closed. Throw away any unused medicine after the expiration date. NOTE: This sheet is a summary. It may not cover all possible information. If you have questions about this medicine, talk to your doctor, pharmacist, or health care provider.  2021 Elsevier/Gold Standard (2018-07-21 11:41:38)

## 2020-11-24 NOTE — Progress Notes (Signed)
HPI [name] returns today for ongoing evaluation and management of palpitations.  She is a very pleasant 60 year old woman with a history of recurrent and documented symptomatic SVT who underwent EP study and catheter ablation over a year ago.  At that time, we could not induce sustained SVT.  She did have echo beats and double echo beats noted.  She had no inducible atrial tachycardia, atrial flutter, atrial fibrillation, or AV reentrant tachycardia.  A slow pathway modification was carried out successfully.  Over the past few months she has had recurrent palpitations. She has worn a cardiac monitor which is pending.   She denies chest pain or shortness of breath.  No peripheral edema.   No Known Allergies   Current Outpatient Medications  Medication Sig Dispense Refill  . Albuterol Sulfate (PROAIR RESPICLICK) 562 (90 Base) MCG/ACT AEPB Inhale 2 puffs into the lungs every 4 (four) hours as needed. 3 each 3  . ALPRAZolam (XANAX) 1 MG tablet Take 0.5 tablets (0.5 mg total) by mouth at bedtime as needed. 30 tablet 1  . diltiazem (CARDIZEM) 30 MG tablet TAKE 1 TABLET BY MOUTH THREE TIMES DAILY AS NEEDED AT ONSET OF ATRIAL FIBRILLATION / FAST  HEART RATE. 30 tablet 0  . diltiazem (CARDIZEM) 30 MG tablet Take 1 tablet (30 mg total) by mouth 3 (three) times daily as needed. At onset of fast heart rate 90 tablet 3  . docusate sodium (COLACE) 100 MG capsule Take 100 mg by mouth at bedtime.    Noelle Penner ASPIRIN ADULT LOW DOSE 81 MG EC tablet Take 1 tablet by mouth once daily 90 tablet 2  . meloxicam (MOBIC) 7.5 MG tablet Take 7.5 mg by mouth daily as needed for pain.     . SYMBICORT 160-4.5 MCG/ACT inhaler Inhale 2 puffs into the lungs 2 (two) times daily. 3 Inhaler 4  . diltiazem (CARDIZEM CD) 240 MG 24 hr capsule Take 1 capsule (240 mg total) by mouth at bedtime. 90 capsule 3   No current facility-administered medications for this visit.     Past Medical History:  Diagnosis Date  . Asthma     adult onset, confirmed with pre- and post-bronchodialator PFTs 06/13/2017 at PCP.  Marland Kitchen Menorrhagia 2014  . Osteoporosis   . Paroxysmal atrial fibrillation (HCC)   . Paroxysmal SVT (supraventricular tachycardia) (HCC)     ROS:   All systems reviewed and negative except as noted in the HPI.   Past Surgical History:  Procedure Laterality Date  . SVT ABLATION N/A 07/04/2020   Procedure: SVT ABLATION;  Surgeon: Evans Lance, MD;  Location: [facility];  Service: Cardiovascular;  Laterality: N/A;     Family History  Problem Relation Age of Onset  . Hypertension Mother   . Heart disease Mother        stent placement  . Hyperlipidemia Mother   . Diabetes Maternal Aunt   . Heart disease Maternal Grandmother      Social History   Socioeconomic History  . Marital status: Married    Spouse name: Not on file  . Number of children: Not on file  . Years of education: Not on file  . Highest education level: Not on file  Occupational History  . Occupation: Network engineer    Comment: [employer] for 25 years  Tobacco Use  . Smoking status: Former Smoker    Years: 20.00    Types: Cigarettes  . Smokeless tobacco: Never Used  . Tobacco  comment: QUIT 2015  Vaping Use  . Vaping Use: Never used  Substance and Sexual Activity  . Alcohol use: No  . Drug use: Yes    Frequency: 1.0 times per week    Types: Marijuana  . Sexual activity: Yes    Partners: Male    Birth control/protection: Other-see comments    Comment: vasectomy, INTERCOURSE AGE 17SEXUAL PARTNERS LESS THAN 5  Other Topics Concern  . Not on file  Social History Narrative   Married since 1985. Has 2 daughter and 2 granddaughters. Husband is retired Audiological scientist.   Grew up in [place]. Her dad died before she was born. Her stepdad adopted her, had a great childhood. No h/o abuse. Dad was a used Teacher, early years/pre. Mom was an Garment/textile technologist.       Caffeine-none now.   Legal- none   Religious-Christian   Social Determinants of  Health   Financial Resource Strain: Low Risk   . Difficulty of Paying Living Expenses: Not hard at all  Food Insecurity: No Food Insecurity  . Worried About Charity fundraiser in the Last Year: Never true  . Ran Out of Food in the Last Year: Never true  Transportation Needs: No Transportation Needs  . Lack of Transportation (Medical): No  . Lack of Transportation (Non-Medical): No  Physical Activity: Inactive  . Days of Exercise per Week: 0 days  . Minutes of Exercise per Session: 0 min  Stress: Stress Concern Present  . Feeling of Stress : Rather much  Social Connections: Moderately Integrated  . Frequency of Communication with Friends and Family: More than three times a week  . Frequency of Social Gatherings with Friends and Family: More than three times a week  . Attends Religious Services: More than 4 times per year  . Active Member of Clubs or Organizations: No  . Attends Archivist Meetings: Never  . Marital Status: Married  Human resources officer Violence: Not on file     BP 112/74   Pulse 68   Ht 5' 5.5" (1.664 m)   Wt 167 lb 12.8 oz (76.1 kg)   LMP 07/22/2013   SpO2 96%   BMI 27.50 kg/m   Physical Exam:  Well appearing NAD HEENT: Unremarkable Neck:  No JVD, no thyromegally Lymphatics:  No adenopathy Back:  No CVA tenderness Lungs:  Clear with no wheezes HEART:  Regular rate rhythm, no murmurs, no rubs, no clicks Abd:  soft, positive bowel sounds, no organomegally, no rebound, no guarding Ext:  2 plus pulses, no edema, no cyanosis, no clubbing Skin:  No rashes no nodules Neuro:  CN II through XII intact, motor grossly intact  EKG - low atrial rhythm   Assess/Plan: 1. SVT - she is s/p ablation. 2. Probable atrial fib - I have reviewed strips from 2017 where she had SVT degenerate into atrial fib. I suspect that she is now having atrial fib. I discussed the treatment options. No indication for anti-coag as CHADS VASC is 1. We will await the cardiac  monitor which she just turned in. I discussed flecainide and we will prescribe prn flecainide 100 mg taken as needed not to exceed 200 mg a day total. She will continue daily cardizem.   Carleene Overlie Abagail Limb,MD

## 2020-12-04 ENCOUNTER — Other Ambulatory Visit: Payer: Self-pay | Admitting: Physician Assistant

## 2020-12-12 ENCOUNTER — Other Ambulatory Visit: Payer: Self-pay

## 2020-12-12 ENCOUNTER — Ambulatory Visit (INDEPENDENT_AMBULATORY_CARE_PROVIDER_SITE_OTHER): Payer: BC Managed Care – PPO | Admitting: Psychiatry

## 2020-12-12 DIAGNOSIS — F411 Generalized anxiety disorder: Secondary | ICD-10-CM | POA: Diagnosis not present

## 2020-12-12 NOTE — Progress Notes (Signed)
[facility] Counselor/Therapist Progress Note  Patient ID: Alexandra Grant, MRN: [id number],    Date: 12/12/2020  Time Spent: 60 minutes    9:00am to 10:00am   Treatment Type: Individual Therapy  Reported Symptoms: Anxiety, "and the weekend has been especially stressful"  Mental Status Exam:  Appearance:   Neat     Behavior:  Appropriate, Sharing and Motivated  Motor:  Normal  Speech/Language:   Clear and Coherent  Affect:  Depressed and anxious  Mood:  anxious  Thought process:  goal directed  Thought content:    some obsessiveness  Sensory/Perceptual disturbances:    WNL  Orientation:  oriented to person, place, time/date, situation, day of week, month of year and year  Attention:  Good  Concentration:  Fair  Memory:  WNL  Fund of knowledge:   Good  Insight:    Good  Judgment:   Good  Impulse Control:  Good   Risk Assessment: Danger to Self:  No Self-injurious Behavior: No Danger to Others: No Duty to Warn:no Physical Aggression / Violence:No  Access to Firearms a concern: No  Gang Involvement:No    Subjective: Today reports anxiety re: her own "personal issues" and also concerns with daughter an grand-daughter.  Issues in marriage continue and aggravate her anxiety.  See Progress Note below.   Interventions: Solution-Oriented/Positive Psychology and Ego-Supportive  Diagnosis:   ICD-10-CM   1. Generalized anxiety disorder  F41.1     Plan of Care: Patient not signing treatment plan on computer screen due to COVID.  Treatment goals: Treatment goals remain on treatment plan as patient works with strategies to achieve her goals. Progress will be assessed each session and documented in the "progress" section of note.  Long-term goal: Reduce overall level, frequency, and intensity of the anxiety so that daily functioning is not impaired. Patient to practice positive self-talk daily.  Short-term goal: Verbalize an understanding of the role that fearful  thinking plays in creating fearful thoughts, excessive worrying, and persistent anxiety symptoms.  Strategies: Teach patient to implement thought stopping technique to interrupt anxious/negative/fearful thoughts, and replace with more reality-based and empowering thoughts.   Progress: Patient in today reporting anxiety, "self-conscious about my body", significant issues re: daughter and grand-daughter, issues in my marriage (details not included in this note due to patient's privacy needs). Bigger concern today are some marital concerns.  Feels that some of the marital situation has diminished but not totally trusting that, and explains why she has difficulty trusting. Doubts, fears, uncertainties, and having lots of feelings/doubts/questions re: marital concerns all processed in session today for patient to feel more heard and supported, and she agreed that she and husband need marital counseling to address their marital concerns and is to follow up on this.   Looked at her personal goals for individual therapy and worked with her short term goal in tx plan above to practice better managing her fearful/anxious/depressive thoughts and interrupting and replacing with more reality-based thoughts that do not feed anxiety, fearfulness, nor depression.  Patient continued to talk some about how her individual issues are definitely impacted by marital concerns and she and husband will be seeking an appointment together with a marital therapist.  Patient to continue working on her anxious/fearful/depressive thought interruption between sessions.  She is encouraged to remain in the present versus the past or future, to stay in contact with people who are supportive of her, to get outside daily and walk, focus on things she can control  or change, practice good nutrition and getting enough sleep, practice more positive self-care and positive self talk, and to recognize the strength she is demonstrating in  addressing her anxiety and other concerns as she is motivated for treatment and wants to become healthier emotionally.  Goal review and progress/challenges noted with patient.  Next appointment within 2 to 3 weeks.  Shanon Ace, LCSW

## 2020-12-26 ENCOUNTER — Ambulatory Visit: Payer: BC Managed Care – PPO | Admitting: Psychiatry

## 2021-01-02 ENCOUNTER — Other Ambulatory Visit: Payer: Self-pay

## 2021-01-02 MED ORDER — DILTIAZEM HCL ER COATED BEADS 240 MG PO CP24: 240.0000 mg | ORAL_CAPSULE | Freq: Every day | ORAL | 3 refills | Status: DC

## 2021-01-05 ENCOUNTER — Ambulatory Visit: Payer: BC Managed Care – PPO | Admitting: Physician Assistant

## 2021-01-10 ENCOUNTER — Ambulatory Visit: Payer: BC Managed Care – PPO | Admitting: Psychiatry

## 2021-01-24 ENCOUNTER — Ambulatory Visit: Payer: BC Managed Care – PPO | Admitting: Psychiatry

## 2021-02-01 ENCOUNTER — Other Ambulatory Visit: Payer: Self-pay | Admitting: Internal Medicine

## 2021-02-12 ENCOUNTER — Other Ambulatory Visit: Payer: Self-pay | Admitting: Family Medicine

## 2021-02-12 DIAGNOSIS — J4530 Mild persistent asthma, uncomplicated: Secondary | ICD-10-CM

## 2021-03-03 ENCOUNTER — Ambulatory Visit: Payer: BC Managed Care – PPO | Admitting: Internal Medicine

## 2021-03-08 ENCOUNTER — Ambulatory Visit: Payer: BC Managed Care – PPO | Admitting: Internal Medicine

## 2021-03-13 ENCOUNTER — Other Ambulatory Visit: Payer: Self-pay | Admitting: Family Medicine

## 2021-03-13 DIAGNOSIS — J4530 Mild persistent asthma, uncomplicated: Secondary | ICD-10-CM

## 2021-03-14 ENCOUNTER — Other Ambulatory Visit: Payer: Self-pay | Admitting: Family Medicine

## 2021-03-14 DIAGNOSIS — J4530 Mild persistent asthma, uncomplicated: Secondary | ICD-10-CM

## 2021-03-28 ENCOUNTER — Other Ambulatory Visit: Payer: Self-pay | Admitting: Family Medicine

## 2021-03-28 DIAGNOSIS — J4530 Mild persistent asthma, uncomplicated: Secondary | ICD-10-CM

## 2021-03-30 NOTE — Progress Notes (Deleted)
[place] at [facility] [address] [place], [place] [address] [phone]  Date:  04/03/2021   Name:  Alexandra Grant   DOB:  Mar 22, 1961   MRN:  [id number]  PCP:  Darreld Mclean, MD    Chief Complaint: No chief complaint on file.   History of Present Illness:  Alexandra Grant is a 60 y.o. very pleasant female patient who presents with the following:  Pt seen today to discuss medication Last visit with myself about one year ago  She has history of SVT s/p ablation about a year ago   Seen by Dr Lovena Le in April-  Assess/Plan: 1. SVT - she is s/p ablation. 2. Probable atrial fib - I have reviewed strips from 2017 where she had SVT degenerate into atrial fib. I suspect that she is now having atrial fib. I discussed the treatment options. No indication for anti-coag as CHADS VASC is 1. We will await the cardiac monitor which she just turned in. I discussed flecainide and we will prescribe prn flecainide 100 mg taken as needed not to exceed 200 mg a day total. She will continue daily cardizem  She has endocrinology care through [facility]  Married to Page, 2 daughter and 2 young grands  Diltiazem Flecainide prn Xanax prn Albuterol Symbicort?  Pneumonia vaccine- h/o asthma  Covid booster Shingrix- can give 2nd dose Mammo UTD  Pap UTD Colon  UTD  Can update labs today if she likes      Patient Active Problem List   Diagnosis Date Noted   Palpitations 06/17/2020   Paroxysmal supraventricular tachycardia ([place]) 09/11/2017   Paroxysmal atrial fibrillation ([place]) 09/11/2017   Asthma in adult, moderate persistent, uncomplicated 0000000   Osteoporosis without current pathological fracture 12/24/2016   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    Menorrhagia 2014   Osteoporosis    Paroxysmal atrial fibrillation (HCC)    Paroxysmal SVT (supraventricular tachycardia) ([place])     Past Surgical History:  Procedure Laterality Date   SVT ABLATION N/A 07/04/2020   Procedure: SVT ABLATION;  Surgeon: Evans Lance, MD;  Location: [place];  Service: Cardiovascular;  Laterality: N/A;    Social History   Tobacco Use   Smoking status: Former    Years: 20.00    Types: Cigarettes   Smokeless tobacco: Never   Tobacco comments:    QUIT 2015  Vaping Use   Vaping Use: Never used  Substance Use Topics   Alcohol use: No   Drug use: Yes    Frequency: 1.0 times per week    Types: Marijuana    Family History  Problem Relation Age of Onset   Hypertension Mother    Heart disease Mother        stent placement   Hyperlipidemia Mother    Diabetes Maternal Aunt    Heart disease Maternal Grandmother     No Known Allergies  Medication list has been reviewed and updated.  Current Outpatient Medications on File Prior to Visit  Medication Sig Dispense Refill   Albuterol Sulfate (PROAIR RESPICLICK) [id number] (90 Base) MCG/ACT AEPB Inhale 2 puffs into the lungs every 4 (four) hours as needed. 3 each 3   ALPRAZolam (XANAX) 1 MG tablet Take 0.5 tablets (0.5 mg  total) by mouth at bedtime as needed. 30 tablet 1   diltiazem (CARDIZEM CD) 240 MG 24 hr capsule Take 1 capsule (240 mg total) by mouth at bedtime. 90 capsule 3   diltiazem (CARDIZEM) 30 MG tablet TAKE 1 TABLET BY MOUTH THREE TIMES DAILY AS NEEDED AT ONSET OF ATRIAL FIBRILLATION / FAST  HEART RATE. 30 tablet 0   diltiazem (CARDIZEM) 30 MG tablet Take 1 tablet (30 mg total) by mouth 3 (three) times daily as needed. At onset of fast heart rate 90 tablet 3   docusate sodium (COLACE) 100 MG capsule Take 100 mg by mouth at bedtime.     EQ ASPIRIN ADULT LOW DOSE 81 MG EC tablet Take 1 tablet by mouth once daily 90 tablet 2   flecainide (TAMBOCOR) 100 MG tablet TAKE ONE TABLET BY MOUTH AS  NEEDED FOR HEART RACING. MAY TAKE UP TO 2 TABLETS IN 24 HOURS. 180 tablet 3   meloxicam (MOBIC) 7.5 MG tablet Take 7.5 mg by mouth daily as needed for pain.      SYMBICORT 160-4.5 MCG/ACT inhaler INHALE 2 PUFFS INTO THE LUNGS TWICE A DAY 10.2 each 0   No current facility-administered medications on file prior to visit.    Review of Systems:  As per HPI- otherwise negative.   Physical Examination: There were no vitals filed for this visit. There were no vitals filed for this visit. There is no height or weight on file to calculate BMI. Ideal Body Weight:    GEN: no acute distress. HEENT: Atraumatic, Normocephalic.  Ears and Nose: No external deformity. CV: RRR, No M/G/R. No JVD. No thrill. No extra heart sounds. PULM: CTA B, no wheezes, crackles, rhonchi. No retractions. No resp. distress. No accessory muscle use. ABD: S, NT, ND, +BS. No rebound. No HSM. EXTR: No c/c/e PSYCH: Normally interactive. Conversant.    Assessment and Plan: ***  This visit occurred during the SARS-CoV-2 public health emergency.  Safety protocols were in place, including screening questions prior to the visit, additional usage of staff PPE, and extensive cleaning of exam room while observing appropriate contact time as indicated for disinfecting solutions.   Signed Lamar Blinks, MD

## 2021-04-03 ENCOUNTER — Ambulatory Visit: Payer: BC Managed Care – PPO | Admitting: Family Medicine

## 2021-04-21 ENCOUNTER — Other Ambulatory Visit: Payer: Self-pay | Admitting: Family Medicine

## 2021-04-21 DIAGNOSIS — J4530 Mild persistent asthma, uncomplicated: Secondary | ICD-10-CM

## 2021-04-21 DIAGNOSIS — L292 Pruritus vulvae: Secondary | ICD-10-CM | POA: Insufficient documentation

## 2021-05-11 NOTE — Progress Notes (Addendum)
[place] at [facility] [address], [place], [place] [address] [phone]  Date:  05/15/2021   Name:  Alexandra Grant   DOB:  March 28, 1961   MRN:  [id number]  PCP:  Darreld Mclean, MD    Chief Complaint: Annual Exam   History of Present Illness:  Alexandra Grant is a 60 y.o. very pleasant female patient who presents with the following:  Pt seen today for a CPE Last visit with myself about one year ago  She had a history of supraventricular tachycardia in the past, had an ablation 10 years ago and then some recurrence  Seen by cardiology- Dr Lovena Le- in April: Assess/Plan: 1. SVT - she is s/p ablation. 2. Probable atrial fib - I have reviewed strips from 2017 where she had SVT degenerate into atrial fib. I suspect that she is now having atrial fib. I discussed the treatment options. No indication for anti-coag as CHADS VASC is 1. We will await the cardiac monitor which she just turned in. I discussed flecainide and we will prescribe prn flecainide 100 mg taken as needed not to exceed 200 mg a day total. She will continue daily cardizem.  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  She has noted some laryngitis for about 2 weeks- scratchy throat but no ST   She did have covid in late July and recovered well  Covid booster- she plans to do soon  Shingrix- give today  Flu vaccine; will do today  Most recent labs about a year ago - she is fasting  Mammo UTD Colon 2015- recheck in 10 years  She sees GYN- per her GYN   She is a former smoker- quit about 2 years ago, she did a 1/2 ppd for about 30 years   Patient Active Problem List   Diagnosis Date Noted   Palpitations 06/17/2020    Paroxysmal supraventricular tachycardia ([place]) 09/11/2017   Paroxysmal atrial fibrillation ([place]) 09/11/2017   Asthma in adult, moderate persistent, uncomplicated 71/24/5809   Osteoporosis without current pathological fracture 12/24/2016   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   Menorrhagia 2014   Osteoporosis    Paroxysmal atrial fibrillation (HCC)    Paroxysmal SVT (supraventricular tachycardia) ([place])     Past Surgical History:  Procedure Laterality Date   SVT ABLATION N/A 07/04/2020   Procedure: SVT ABLATION;  Surgeon: Evans Lance, MD;  Location: [facility];  Service: Cardiovascular;  Laterality: N/A;    Social History   Tobacco Use   Smoking status: Former    Years: 20.00    Types: Cigarettes   Smokeless tobacco: Never   Tobacco comments:    QUIT 2015  Vaping Use   Vaping Use: Never used  Substance Use Topics   Alcohol use: No   Drug use: Yes    Frequency: 1.0 times per week    Types: Marijuana    Family History  Problem Relation Age of Onset  Hypertension Mother    Heart disease Mother        stent placement   Hyperlipidemia Mother    Diabetes Maternal Aunt    Heart disease Maternal Grandmother     No Known Allergies  Medication list has been reviewed and updated.  Current Outpatient Medications on File Prior to Visit  Medication Sig Dispense Refill   Albuterol Sulfate (PROAIR RESPICLICK) 031 (90 Base) MCG/ACT AEPB Inhale 2 puffs into the lungs every 4 (four) hours as needed. 3 each 3   ALPRAZolam (XANAX) 1 MG tablet Take 0.5 tablets (0.5 mg total) by mouth at bedtime as needed. 30 tablet 1   diltiazem (CARDIZEM CD) 240 MG 24 hr capsule Take 1 capsule (240 mg total) by mouth at bedtime. 90 capsule 3   diltiazem (CARDIZEM) 30 MG tablet Take 1 tablet (30 mg total)  by mouth 3 (three) times daily as needed. At onset of fast heart rate 90 tablet 3   docusate sodium (COLACE) 100 MG capsule Take 100 mg by mouth at bedtime.     EQ ASPIRIN ADULT LOW DOSE 81 MG EC tablet Take 1 tablet by mouth once daily 90 tablet 2   meloxicam (MOBIC) 7.5 MG tablet Take 7.5 mg by mouth daily as needed for pain.      SYMBICORT 160-4.5 MCG/ACT inhaler INHALE 2 PUFFS INTO THE LUNGS TWICE A DAY 10.2 each 0   No current facility-administered medications on file prior to visit.    Review of Systems:  As per HPI- otherwise negative.   Physical Examination: Vitals:   05/15/21 0813  BP: 120/82  Pulse: 68  Resp: 12  Temp: 98.1 F (36.7 C)  SpO2: 97%   Vitals:   05/15/21 0813  Weight: 167 lb 9.6 oz (76 kg)  Height: 5\' 6"  (1.676 m)   Body mass index is 27.05 kg/m. Ideal Body Weight: Weight in (lb) to have BMI = 25: 154.6  GEN: no acute distress.  Mild overweight, looks well HEENT: Atraumatic, Normocephalic.  Bilateral TM wnl, oropharynx normal.  PEERL,EOMI.   Ears and Nose: No external deformity. CV: RRR, No M/G/R. No JVD. No thrill. No extra heart sounds. PULM: CTA B, no wheezes, crackles, rhonchi. No retractions. No resp. distress. No accessory muscle use. ABD: S, NT, ND, +BS. No rebound. No HSM. EXTR: No c/c/e PSYCH: Normally interactive. Conversant.    Assessment and Plan: Physical exam  SVT (supraventricular tachycardia) (HCC)  Screening for hyperlipidemia - Plan: Lipid panel  Screening for diabetes mellitus - Plan: Comprehensive metabolic panel, Hemoglobin A1c  Vitamin D deficiency - Plan: VITAMIN D 25 Hydroxy (Vit-D Deficiency, Fractures)  Screening for thyroid disorder - Plan: TSH  Screening, deficiency anemia, iron - Plan: CBC  Fatigue, unspecified type - Plan: TSH, VITAMIN D 25 Hydroxy (Vit-D Deficiency, Fractures)  Change in voice - Plan: Ambulatory referral to ENT  Former smoker - Plan: Ambulatory referral to ENT  Immunization due -  Plan: Varicella-zoster vaccine IM (Shingrix)  Need for influenza vaccination - Plan: Flu Vaccine QUAD 6+ mos PF IM (Fluarix Quad PF)   Physical exam today Encouraged healthy diet and discussed techniques for getting back into routine exercise Flu vaccine given, second dose Shingrix given Will plan further follow- up pending labs. She is concerned about a change in her voice, initiated has been worried about throat cancer as she is a former smoker.  Most likely it is allergies, I suggested trial of Claritin or Zyrtec.  We will also make referral to  ENT for further evaluation   Signed Lamar Blinks, MD  Received her labs as below, message to pt Results for orders placed or performed in visit on 05/15/21  CBC  Result Value Ref Range   WBC 5.1 4.0 - 10.5 K/uL   RBC 4.73 3.87 - 5.11 Mil/uL   Platelets 286.0 150.0 - 400.0 K/uL   Hemoglobin 15.0 12.0 - 15.0 g/dL   HCT 44.2 36.0 - 46.0 %   MCV 93.5 78.0 - 100.0 fl   MCHC 33.8 30.0 - 36.0 g/dL   RDW 12.7 11.5 - 15.5 %  Comprehensive metabolic panel  Result Value Ref Range   Sodium 140 135 - 145 mEq/L   Potassium 4.2 3.5 - 5.1 mEq/L   Chloride 103 96 - 112 mEq/L   CO2 28 19 - 32 mEq/L   Glucose, Bld 95 70 - 99 mg/dL   BUN 12 6 - 23 mg/dL   Creatinine, Ser 0.80 0.40 - 1.20 mg/dL   Total Bilirubin 1.4 (H) 0.2 - 1.2 mg/dL   Alkaline Phosphatase 82 39 - 117 U/L   AST 15 0 - 37 U/L   ALT 18 0 - 35 U/L   Total Protein 7.0 6.0 - 8.3 g/dL   Albumin 4.5 3.5 - 5.2 g/dL   GFR 80.90 >60.00 mL/min   Calcium 9.6 8.4 - 10.5 mg/dL  Hemoglobin A1c  Result Value Ref Range   Hgb A1c MFr Bld 5.6 4.6 - 6.5 %  Lipid panel  Result Value Ref Range   Cholesterol 208 (H) 0 - 200 mg/dL   Triglycerides 85.0 0.0 - 149.0 mg/dL   HDL 72.20 >39.00 mg/dL   VLDL 17.0 0.0 - 40.0 mg/dL   LDL Cholesterol 119 (H) 0 - 99 mg/dL   Total CHOL/HDL Ratio 3    NonHDL 136.12   TSH  Result Value Ref Range   TSH 0.58 0.35 - 5.50 uIU/mL  VITAMIN D 25 Hydroxy  (Vit-D Deficiency, Fractures)  Result Value Ref Range   VITD 24.50 (L) 30.00 - 100.00 ng/mL   The 10-year ASCVD risk score (Arnett DK, et al., 2019) is: 2%   Values used to calculate the score:     Age: 48 years     Sex: Female     Is Non-Hispanic African American: No     Diabetic: No     Tobacco smoker: No     Systolic Blood Pressure: 474 mmHg     Is BP treated: No     HDL Cholesterol: 72.2 mg/dL     Total Cholesterol: 208 mg/dL

## 2021-05-11 NOTE — Patient Instructions (Signed)
Good to see you today- I will be in touch with your labs asap  I suspect your voice hoarseness is due to allergies- try some OTC claritin or zyrtec.  However, we will also refer you to ENT to make sure all is well Flu and 2nd/ last dose of shingles vaccine today Please plan to get your covid booster this fall   Take care!  Assuming all is well we can plan to visit in 6- 12 months Do work on getting back into your exercise routine

## 2021-05-15 ENCOUNTER — Other Ambulatory Visit: Payer: Self-pay

## 2021-05-15 ENCOUNTER — Encounter: Payer: Self-pay | Admitting: Family Medicine

## 2021-05-15 ENCOUNTER — Ambulatory Visit (INDEPENDENT_AMBULATORY_CARE_PROVIDER_SITE_OTHER): Payer: BC Managed Care – PPO | Admitting: Family Medicine

## 2021-05-15 VITALS — BP 120/82 | HR 68 | Temp 98.1°F | Resp 12 | Ht 66.0 in | Wt 167.6 lb

## 2021-05-15 DIAGNOSIS — E559 Vitamin D deficiency, unspecified: Secondary | ICD-10-CM | POA: Diagnosis not present

## 2021-05-15 DIAGNOSIS — R5383 Other fatigue: Secondary | ICD-10-CM | POA: Diagnosis not present

## 2021-05-15 DIAGNOSIS — Z0000 Encounter for general adult medical examination without abnormal findings: Secondary | ICD-10-CM

## 2021-05-15 DIAGNOSIS — I471 Supraventricular tachycardia, unspecified: Secondary | ICD-10-CM

## 2021-05-15 DIAGNOSIS — Z23 Encounter for immunization: Secondary | ICD-10-CM

## 2021-05-15 DIAGNOSIS — Z13220 Encounter for screening for lipoid disorders: Secondary | ICD-10-CM | POA: Diagnosis not present

## 2021-05-15 DIAGNOSIS — Z1329 Encounter for screening for other suspected endocrine disorder: Secondary | ICD-10-CM

## 2021-05-15 DIAGNOSIS — Z130 Encounter for screening for diseases of the blood and blood-forming organs and certain disorders involving the immune mechanism: Secondary | ICD-10-CM | POA: Diagnosis not present

## 2021-05-15 DIAGNOSIS — R499 Unspecified voice and resonance disorder: Secondary | ICD-10-CM

## 2021-05-15 DIAGNOSIS — Z131 Encounter for screening for diabetes mellitus: Secondary | ICD-10-CM | POA: Diagnosis not present

## 2021-05-15 DIAGNOSIS — Z87891 Personal history of nicotine dependence: Secondary | ICD-10-CM

## 2021-05-15 LAB — COMPREHENSIVE METABOLIC PANEL
ALT: 18 U/L (ref 0–35)
AST: 15 U/L (ref 0–37)
Albumin: 4.5 g/dL (ref 3.5–5.2)
Alkaline Phosphatase: 82 U/L (ref 39–117)
BUN: 12 mg/dL (ref 6–23)
CO2: 28 mEq/L (ref 19–32)
Calcium: 9.6 mg/dL (ref 8.4–10.5)
Chloride: 103 mEq/L (ref 96–112)
Creatinine, Ser: 0.8 mg/dL (ref 0.40–1.20)
GFR: 80.9 mL/min (ref >60.00)
Glucose, Bld: 95 mg/dL (ref 70–99)
Potassium: 4.2 mEq/L (ref 3.5–5.1)
Sodium: 140 mEq/L (ref 135–145)
Total Bilirubin: 1.4 mg/dL — ABNORMAL HIGH (ref 0.2–1.2)
Total Protein: 7 g/dL (ref 6.0–8.3)

## 2021-05-15 LAB — CBC
HCT: 44.2 % (ref 36.0–46.0)
Hemoglobin: 15 g/dL (ref 12.0–15.0)
MCHC: 33.8 g/dL (ref 30.0–36.0)
MCV: 93.5 fl (ref 78.0–100.0)
Platelets: 286 10*3/uL (ref 150.0–400.0)
RBC: 4.73 Mil/uL (ref 3.87–5.11)
RDW: 12.7 % (ref 11.5–15.5)
WBC: 5.1 10*3/uL (ref 4.0–10.5)

## 2021-05-15 LAB — TSH: TSH: 0.58 u[IU]/mL (ref 0.35–5.50)

## 2021-05-15 LAB — LIPID PANEL
Cholesterol: 208 mg/dL — ABNORMAL HIGH (ref 0–200)
HDL: 72.2 mg/dL (ref >39.00)
LDL Cholesterol: 119 mg/dL — ABNORMAL HIGH (ref 0–99)
NonHDL: 136.12
Total CHOL/HDL Ratio: 3
Triglycerides: 85 mg/dL (ref 0.0–149.0)
VLDL: 17 mg/dL (ref 0.0–40.0)

## 2021-05-15 LAB — VITAMIN D 25 HYDROXY (VIT D DEFICIENCY, FRACTURES): VITD: 24.5 ng/mL — ABNORMAL LOW (ref 30.00–100.00)

## 2021-05-15 LAB — HEMOGLOBIN A1C: Hgb A1c MFr Bld: 5.6 % (ref 4.6–6.5)

## 2021-05-15 NOTE — Addendum Note (Signed)
Addended by: Lamar Blinks C on: 05/15/2021 03:45 PM   Modules accepted: Orders

## 2021-05-18 ENCOUNTER — Telehealth: Payer: Self-pay | Admitting: Family Medicine

## 2021-05-18 ENCOUNTER — Other Ambulatory Visit: Payer: Self-pay | Admitting: Family Medicine

## 2021-05-18 DIAGNOSIS — J4530 Mild persistent asthma, uncomplicated: Secondary | ICD-10-CM

## 2021-05-18 NOTE — Telephone Encounter (Signed)
Medication: SYMBICORT 160-4.5 MCG/ACT inhaler [[id number]]     Has the patient contacted their pharmacy? Yes.   (If no, request that the patient contact the pharmacy for the refill.) (If yes, when and what did the pharmacy advise?)    Preferred Pharm[facility] (with phone number [facility] street [facility]    Agent: Please be advised that RX refills may take up to 3 business days. We ask that you follow-up with your pharmacy.

## 2021-05-18 NOTE — Telephone Encounter (Signed)
Patient calling to let us know that there were no refills on this but she is needing it  She said the pharmacy said for her to call us and let us know so we could fix it

## 2021-05-19 ENCOUNTER — Other Ambulatory Visit: Payer: Self-pay

## 2021-05-19 NOTE — Telephone Encounter (Signed)
Rx refilled with Dx code.

## 2021-05-21 NOTE — Progress Notes (Signed)
Cardiology Office Note:    Date:  05/22/2021   ID:  [name], DOB 1961-07-31, MRN [id number]  PCP:  Darreld Mclean, MD   [facility] Providers Cardiologist:  Werner Lean, MD     Referring MD: Darreld Mclean, MD   CC:  Gen cards establishment  History of Present Illness:    Alexandra Grant is a 60 y.o. female with a hx of P-SVT and AT, PAF NOS with prior ablation. HLD managed by Dr. Lovena Le who presents 05/22/21.  Patient notes that she is doing OK.  Notes that in the past she saw Dr. Algernon Huxley and needs to see a new MD.    Notes that she feels good.  Notes that she has a little paranoia that she Is still having   Patient notes that she has chest pain:  feels sternal, last a few seconds.  Feels it a couple of times a day.  1/10.   Works in the yard and cleans in the house.  Does not bring on the chest pain.  Notes that she has a little bit of asthma and this are controled on this.  Notes that she has a history of prior ablation and has had only one episode of palpitations recently.  Ambulatory blood pressure yesterday was 130/78.   Past Medical History:  Diagnosis Date   Asthma    adult onset, confirmed with pre- and post-bronchodialator PFTs 06/13/2017 at PCP.   Menorrhagia 2014   Osteoporosis    Paroxysmal atrial fibrillation (HCC)    Paroxysmal SVT (supraventricular tachycardia) ([place])     Past Surgical History:  Procedure Laterality Date   SVT ABLATION N/A 07/04/2020   Procedure: SVT ABLATION;  Surgeon: Evans Lance, MD;  Location: [facility];  Service: Cardiovascular;  Laterality: N/A;    Current Medications: Current Meds  Medication Sig   Albuterol Sulfate (PROAIR RESPICLICK) 297 (90 Base) MCG/ACT AEPB Inhale 2 puffs into the lungs every 4 (four) hours as needed.   diltiazem (CARDIZEM CD) 240 MG 24 hr capsule Take 1 capsule (240 mg total) by mouth at bedtime.   diltiazem (CARDIZEM) 30 MG tablet Take 1 tablet (30 mg total) by  mouth 3 (three) times daily as needed. At onset of fast heart rate   docusate sodium (COLACE) 100 MG capsule Take 100 mg by mouth at bedtime.   EQ ASPIRIN ADULT LOW DOSE 81 MG EC tablet Take 1 tablet by mouth once daily   meloxicam (MOBIC) 7.5 MG tablet Take 7.5 mg by mouth daily as needed for pain.    SYMBICORT 160-4.5 MCG/ACT inhaler INHALE 2 PUFFS INTO THE LUNGS TWICE A DAY     Allergies:   Patient has no known allergies.   Social History   Socioeconomic History   Marital status: Married    Spouse name: Not on file   Number of children: Not on file   Years of education: Not on file   Highest education level: Not on file  Occupational History   Occupation: Network engineer    Comment: [employer] for 25 years  Tobacco Use   Smoking status: Former    Years: 20.00    Types: Cigarettes   Smokeless tobacco: Never   Tobacco comments:    QUIT 2015  Vaping Use   Vaping Use: Never used  Substance and Sexual Activity   Alcohol use: No   Drug use: Yes    Frequency: 1.0 times per week    Types: Marijuana  Sexual activity: Yes    Partners: Male    Birth control/protection: Other-see comments    Comment: vasectomy, INTERCOURSE AGE 17SEXUAL PARTNERS LESS THAN 5  Other Topics Concern   Not on file  Social History Narrative   Married since 1985. Has 2 daughter and 2 granddaughters. Husband is retired Audiological scientist.   Grew up in [place]. Her dad died before she was born. Her stepdad adopted her, had a great childhood. No h/o abuse. Dad was a used Teacher, early years/pre. Mom was an Garment/textile technologist.       Caffeine-none now.   Legal- none   Religious-Christian   Social Determinants of Health   Financial Resource Strain: Low Risk    Difficulty of Paying Living Expenses: Not hard at all  Food Insecurity: No Food Insecurity   Worried About Charity fundraiser in the Last Year: Never true   Ran Out of Food in the Last Year: Never true  Transportation Needs: No Transportation Needs   Lack of Transportation  (Medical): No   Lack of Transportation (Non-Medical): No  Physical Activity: Inactive   Days of Exercise per Week: 0 days   Minutes of Exercise per Session: 0 min  Stress: Stress Concern Present   Feeling of Stress : Rather much  Social Connections: Moderately Integrated   Frequency of Communication with Friends and Family: More than three times a week   Frequency of Social Gatherings with Friends and Family: More than three times a week   Attends Religious Services: More than 4 times per year   Active Member of [employer] or Organizations: No   Attends Archivist Meetings: Never   Marital Status: Married     Family History: The patient's family history includes Diabetes in her maternal aunt; Heart disease in her maternal grandmother and mother; Hyperlipidemia in her mother; Hypertension in her mother. Mother had MI Grandmother had MI, CABG, and post op AF.  ROS:   Please see the history of present illness.     All other systems reviewed and are negative.  EKGs/Labs/Other Studies Reviewed:    The following studies were reviewed today:  EKG:  EKG is  ordered today.  The ekg ordered today demonstrates  05/22/21: SR 66 WNL  Cardiac Event Monitoring: Date: 11/14/2020 Results: 14 episodes of SVT, longest lasting 15 beats No significant arrhythmias  Recent Labs: 05/15/2021: ALT 18; BUN 12; Creatinine, Ser 0.80; Hemoglobin 15.0; Platelets 286.0; Potassium 4.2; Sodium 140; TSH 0.58  Recent Lipid Panel    Component Value Date/Time   CHOL 208 (H) 05/15/2021 0852   TRIG 85.0 05/15/2021 0852   HDL 72.20 05/15/2021 0852   CHOLHDL 3 05/15/2021 0852   VLDL 17.0 05/15/2021 0852   LDLCALC 119 (H) 05/15/2021 0852     Risk Assessment/Calculations:    CHA2DS2-VASc Score = 1   This indicates a 0.6% annual risk of stroke. The patient's score is based upon: CHF History: 0 HTN History: 0 Diabetes History: 0 Stroke History: 0 Vascular Disease History: 0 Age Score: 0 Gender  Score: 1         Physical Exam:    VS:  BP (!) 144/62   Pulse 66   Ht 5\' 6"  (1.676 m)   Wt 167 lb 9.6 oz (76 kg)   LMP 07/22/2013   SpO2 95%   BMI 27.05 kg/m     Wt Readings from Last 3 Encounters:  05/22/21 167 lb 9.6 oz (76 kg)  05/15/21 167 lb 9.6 oz (76 kg)  11/24/20 167 lb 12.8 oz (76.1 kg)     GEN:  Well nourished, well developed in no acute distress HEENT: Normal NECK: No JVD LYMPHATICS: No lymphadenopathy CARDIAC: RRR, no murmurs, rubs, gallops RESPIRATORY:  Clear to auscultation without rales, wheezing or rhonchi  ABDOMEN: Soft, non-tender, non-distended MUSCULOSKELETAL:  No edema; No deformity  SKIN: Warm and dry NEUROLOGIC:  Alert and oriented x 3 PSYCHIATRIC:  Normal affect   ASSESSMENT:    1. Pure hypercholesterolemia   2. Paroxysmal atrial fibrillation (HCC)   3. Elevated blood pressure reading    PLAN:    HLD Elevated BP without HTN P-SVT and hx of PAF - sees Dr. Lovena Le for P-SVT, continue 240 mg PO Daily Cardizem and PRN - Discussed amb BP follow up  - ASCVD risk of 3.5% but with FH early disease in mother - Discussed CAC- vs lifestyle vs medical mgmt; patient really only wants to be on medication if absolutely necessary; will get CAC  Insomnia - briefly discussed CBTI  Will plan for 3-4 months follow up unless new symptoms or abnormal test results warranting change in plan  Would be reasonable for  APP Follow up             Medication Adjustments/Labs and Tests Ordered: Current medicines are reviewed at length with the patient today.  Concerns regarding medicines are outlined above.  Orders Placed This Encounter  Procedures   CT CARDIAC SCORING (SELF PAY ONLY)   EKG 12-Lead    No orders of the defined types were placed in this encounter.   Patient Instructions  Medication Instructions:  Your physician recommends that you continue on your current medications as directed. Please refer to the Current Medication list given to  you today.  *If you need a refill on your cardiac medications before your next appointment, please call your pharmacy*   Lab Work: NONE If you have labs (blood work) drawn today and your tests are completely normal, you will receive your results only by: [place] (if you have MyChart) OR A paper copy in the mail If you have any lab test that is abnormal or we need to change your treatment, we will call you to review the results.   Testing/Procedures: Your physician has requested that you have a Coronary Calcium Score.    Follow-Up: At [facility], you and your health needs are our priority.  As part of our continuing mission to provide you with exceptional heart care, we have created designated Provider Care Teams.  These Care Teams include your primary Cardiologist (physician) and Advanced Practice Providers (APPs -  Physician Assistants and Nurse Practitioners) who all work together to provide you with the care you need, when you need it.   Your next appointment:   3 month(s)  The format for your next appointment:   In Person  Provider:   You may see Rudean Haskell, MD or one of the following Advanced Practice Providers on your designated Care Team:   Melina Copa, PA-C Ermalinda Barrios, PA-C     Signed, Werner Lean, MD  05/22/2021 9:06 AM    [place]

## 2021-05-22 ENCOUNTER — Encounter: Payer: Self-pay | Admitting: Internal Medicine

## 2021-05-22 ENCOUNTER — Ambulatory Visit: Payer: BC Managed Care – PPO | Admitting: Internal Medicine

## 2021-05-22 ENCOUNTER — Other Ambulatory Visit: Payer: Self-pay

## 2021-05-22 VITALS — BP 144/62 | HR 66 | Ht 66.0 in | Wt 167.6 lb

## 2021-05-22 DIAGNOSIS — R030 Elevated blood-pressure reading, without diagnosis of hypertension: Secondary | ICD-10-CM | POA: Diagnosis not present

## 2021-05-22 DIAGNOSIS — I480 Paroxysmal atrial fibrillation: Secondary | ICD-10-CM | POA: Diagnosis not present

## 2021-05-22 DIAGNOSIS — E7800 Pure hypercholesterolemia, unspecified: Secondary | ICD-10-CM | POA: Insufficient documentation

## 2021-05-22 NOTE — Patient Instructions (Signed)
Medication Instructions:  Your physician recommends that you continue on your current medications as directed. Please refer to the Current Medication list given to you today.  *If you need a refill on your cardiac medications before your next appointment, please call your pharmacy*   Lab Work: NONE If you have labs (blood work) drawn today and your tests are completely normal, you will receive your results only by: [place] (if you have MyChart) OR A paper copy in the mail If you have any lab test that is abnormal or we need to change your treatment, we will call you to review the results.   Testing/Procedures: Your physician has requested that you have a Coronary Calcium Score.    Follow-Up: At [facility], you and your health needs are our priority.  As part of our continuing mission to provide you with exceptional heart care, we have created designated Provider Care Teams.  These Care Teams include your primary Cardiologist (physician) and Advanced Practice Providers (APPs -  Physician Assistants and Nurse Practitioners) who all work together to provide you with the care you need, when you need it.   Your next appointment:   3 month(s)  The format for your next appointment:   In Person  Provider:   You may see Rudean Haskell, MD or one of the following Advanced Practice Providers on your designated Care Team:   Melina Copa, PA-C Ermalinda Barrios, PA-C

## 2021-05-30 ENCOUNTER — Ambulatory Visit (INDEPENDENT_AMBULATORY_CARE_PROVIDER_SITE_OTHER): Payer: BC Managed Care – PPO | Admitting: Otolaryngology

## 2021-06-06 ENCOUNTER — Other Ambulatory Visit: Payer: Self-pay

## 2021-06-06 DIAGNOSIS — J4530 Mild persistent asthma, uncomplicated: Secondary | ICD-10-CM

## 2021-06-06 MED ORDER — BUDESONIDE-FORMOTEROL FUMARATE 160-4.5 MCG/ACT IN AERO: INHALATION_SPRAY | RESPIRATORY_TRACT | 2 refills | Status: DC

## 2021-06-12 ENCOUNTER — Ambulatory Visit (INDEPENDENT_AMBULATORY_CARE_PROVIDER_SITE_OTHER)
Admission: RE | Admit: 2021-06-12 | Discharge: 2021-06-12 | Disposition: A | Discharge status: Home / Self Care | Payer: Self-pay | Source: Ambulatory Visit | Attending: Internal Medicine | Admitting: Internal Medicine

## 2021-06-12 ENCOUNTER — Other Ambulatory Visit: Payer: Self-pay

## 2021-06-12 DIAGNOSIS — R030 Elevated blood-pressure reading, without diagnosis of hypertension: Secondary | ICD-10-CM

## 2021-06-14 ENCOUNTER — Encounter: Payer: Self-pay | Admitting: Family Medicine

## 2021-07-05 ENCOUNTER — Other Ambulatory Visit: Payer: Self-pay | Admitting: Family Medicine

## 2021-07-05 DIAGNOSIS — J4530 Mild persistent asthma, uncomplicated: Secondary | ICD-10-CM

## 2021-08-14 ENCOUNTER — Other Ambulatory Visit: Payer: Self-pay | Admitting: Family Medicine

## 2021-08-22 NOTE — Progress Notes (Signed)
Cardiology Office Note:    Date:  08/23/2021   ID:  Alexandra Grant, DOB 1960-11-10, MRN [id number]  PCP:  Darreld Mclean, MD   [facility] Providers Cardiologist:  Werner Lean, MD     Referring MD: Darreld Mclean, MD   CC:  F/u HLD and PSVT  History of Present Illness:    Alexandra Grant is a 61 y.o. female with a hx of P-SVT and AT, PAF NOS with prior ablation. HLD managed by Dr. Lovena Le who presented 10/22.  In interim of this visit, patient had negative CAC.  Seen 08/23/21.  Patient notes that she is doing well.  Spent time with family for the holidays and has been enjoyed retirement.  Is off Xanax for sleep.   Had zero CAC.  No chest pain or pressure.  No SOB/DOE and no PND/Orthopnea.  No weight gain or leg swelling.  No palpitations or syncope.  Does feel rare panic attacks in menopause: she checks her Apple watch, sees that its ok, and its been very reassuring.  Past Medical History:  Diagnosis Date   Asthma    adult onset, confirmed with pre- and post-bronchodialator PFTs 06/13/2017 at PCP.   Menorrhagia 2014   Osteoporosis    Paroxysmal atrial fibrillation (HCC)    Paroxysmal SVT (supraventricular tachycardia) ([place])     Past Surgical History:  Procedure Laterality Date   SVT ABLATION N/A 07/04/2020   Procedure: SVT ABLATION;  Surgeon: Evans Lance, MD;  Location: [place] [facility];  Service: Cardiovascular;  Laterality: N/A;    Current Medications: Current Meds  Medication Sig   albuterol (VENTOLIN HFA) 108 (90 Base) MCG/ACT inhaler 2 PUFF EVERY 4 HRS AS NEEDED   budesonide-formoterol (SYMBICORT) 160-4.5 MCG/ACT inhaler INHALE 2 PUFFS INTO THE LUNGS TWICE A DAY   diltiazem (CARDIZEM CD) 240 MG 24 hr capsule Take 1 capsule (240 mg total) by mouth at bedtime.   diltiazem (CARDIZEM) 30 MG tablet Take 1 tablet (30 mg total) by mouth 3 (three) times daily as needed. At onset of fast heart rate   docusate sodium (COLACE) 100 MG capsule Take 100 mg  by mouth at bedtime.   EQ ASPIRIN ADULT LOW DOSE 81 MG EC tablet Take 1 tablet by mouth once daily   meloxicam (MOBIC) 7.5 MG tablet Take 7.5 mg by mouth daily as needed for pain.      Allergies:   Patient has no known allergies.   Social History   Socioeconomic History   Marital status: Married    Spouse name: Not on file   Number of children: Not on file   Years of education: Not on file   Highest education level: Not on file  Occupational History   Occupation: Network engineer    Comment: [employer] for 25 years  Tobacco Use   Smoking status: Former    Years: 20.00    Types: Cigarettes   Smokeless tobacco: Never   Tobacco comments:    QUIT 2015  Vaping Use   Vaping Use: Never used  Substance and Sexual Activity   Alcohol use: No   Drug use: Yes    Frequency: 1.0 times per week    Types: Marijuana   Sexual activity: Yes    Partners: Male    Birth control/protection: Other-see comments    Comment: vasectomy, INTERCOURSE AGE 17SEXUAL PARTNERS LESS THAN 5  Other Topics Concern   Not on file  Social History Narrative   Married since 1985. Has  2 daughter and 2 granddaughters. Husband is retired Audiological scientist.   Grew up in [place]. Her dad died before she was born. Her stepdad adopted her, had a great childhood. No h/o abuse. Dad was a used Teacher, early years/pre. Mom was an Garment/textile technologist.       Caffeine-none now.   Legal- none   Religious-Christian   Social Determinants of Health   Financial Resource Strain: Low Risk    Difficulty of Paying Living Expenses: Not hard at all  Food Insecurity: No Food Insecurity   Worried About Charity fundraiser in the Last Year: Never true   Ran Out of Food in the Last Year: Never true  Transportation Needs: No Transportation Needs   Lack of Transportation (Medical): No   Lack of Transportation (Non-Medical): No  Physical Activity: Inactive   Days of Exercise per Week: 0 days   Minutes of Exercise per Session: 0 min  Stress: Stress Concern Present    Feeling of Stress : Rather much  Social Connections: Moderately Integrated   Frequency of Communication with Friends and Family: More than three times a week   Frequency of Social Gatherings with Friends and Family: More than three times a week   Attends Religious Services: More than 4 times per year   Active Member of [employer] or Organizations: No   Attends Archivist Meetings: Never   Marital Status: Married     Family History: The patient's family history includes Diabetes in her maternal aunt; Heart disease in her maternal grandmother and mother; Hyperlipidemia in her mother; Hypertension in her mother. Mother had MI Grandmother had MI, CABG, and post op AF.  ROS:   Please see the history of present illness.     All other systems reviewed and are negative.  EKGs/Labs/Other Studies Reviewed:    The following studies were reviewed today:  EKG:   05/22/21: SR 66 WNL  Cardiac Event Monitoring: Date: 11/14/2020 Results: 14 episodes of SVT, longest lasting 15 beats No significant arrhythmias  Recent Labs: 05/15/2021: ALT 18; BUN 12; Creatinine, Ser 0.80; Hemoglobin 15.0; Platelets 286.0; Potassium 4.2; Sodium 140; TSH 0.58  Recent Lipid Panel    Component Value Date/Time   CHOL 208 (H) 05/15/2021 0852   TRIG 85.0 05/15/2021 0852   HDL 72.20 05/15/2021 0852   CHOLHDL 3 05/15/2021 0852   VLDL 17.0 05/15/2021 0852   LDLCALC 119 (H) 05/15/2021 0852     Risk Assessment/Calculations:    CHA2DS2-VASc Score = 1   This indicates a 0.6% annual risk of stroke. The patient's score is based upon: CHF History: 0 HTN History: 0 Diabetes History: 0 Stroke History: 0 Vascular Disease History: 0 Age Score: 0 Gender Score: 1         Physical Exam:    VS:  BP 116/78    Pulse 71    Ht 5\' 6"  (1.676 m)    Wt 77.1 kg    LMP 07/22/2013    SpO2 96%    BMI 27.44 kg/m     Wt Readings from Last 3 Encounters:  08/23/21 77.1 kg  05/22/21 76 kg  05/15/21 76 kg     Gen: No  distress  Neck: No JVD Cardiac: No Rubs or Gallops, no murmur, regular rhythm, +2 radial pulses Respiratory: Clear to auscultation bilaterally, normal effort, normal  respiratory rate GI: Soft, nontender, non-distended  MS: No  edema;  moves all extremities Integument: Skin feels warm Neuro:  At time of evaluation, alert and oriented  to person/place/time/situation  Psych: Normal affect, patient feels well   ASSESSMENT:    1. Mixed hyperlipidemia   2. Paroxysmal atrial fibrillation (HCC)   3. Paroxysmal supraventricular tachycardia (HCC)     PLAN:    HLD P-SVT and  PAF, non further s/p ablation on only ASA seeing Dr. Lovena Le continue 240 mg PO Daily Cardizem and PRN - ASCVD risk of 3.5% but with FH early disease in mother - Zero CAC, LDL goal < 100; patient has concerns on medications, given her newly retired status, she is planning to make some lifestyle changes  Offered PRN f/u; patient would prefer one year f/u with me which is reasonable    Medication Adjustments/Labs and Tests Ordered: Current medicines are reviewed at length with the patient today.  Concerns regarding medicines are outlined above.  No orders of the defined types were placed in this encounter.   No orders of the defined types were placed in this encounter.    Patient Instructions  Medication Instructions:  Your physician recommends that you continue on your current medications as directed. Please refer to the Current Medication list given to you today.  *If you need a refill on your cardiac medications before your next appointment, please call your pharmacy*   Lab Work: NONE If you have labs (blood work) drawn today and your tests are completely normal, you will receive your results only by: [place] (if you have MyChart) OR A paper copy in the mail If you have any lab test that is abnormal or we need to change your treatment, we will call you to review the  results.   Testing/Procedures: NONE   Follow-Up: At [facility], you and your health needs are our priority.  As part of our continuing mission to provide you with exceptional heart care, we have created designated Provider Care Teams.  These Care Teams include your primary Cardiologist (physician) and Advanced Practice Providers (APPs -  Physician Assistants and Nurse Practitioners) who all work together to provide you with the care you need, when you need it.    Your next appointment:   12 month(s)  The format for your next appointment:   In Person  Provider:   Werner Lean, MD       Signed, Werner Lean, MD  08/23/2021 9:58 AM    [place]

## 2021-08-23 ENCOUNTER — Ambulatory Visit: Payer: BC Managed Care – PPO | Admitting: Internal Medicine

## 2021-08-23 ENCOUNTER — Encounter: Payer: Self-pay | Admitting: Internal Medicine

## 2021-08-23 ENCOUNTER — Other Ambulatory Visit: Payer: Self-pay

## 2021-08-23 VITALS — BP 116/78 | HR 71 | Ht 66.0 in | Wt 170.0 lb

## 2021-08-23 DIAGNOSIS — I471 Supraventricular tachycardia: Secondary | ICD-10-CM | POA: Diagnosis not present

## 2021-08-23 DIAGNOSIS — E782 Mixed hyperlipidemia: Secondary | ICD-10-CM | POA: Diagnosis not present

## 2021-08-23 DIAGNOSIS — I480 Paroxysmal atrial fibrillation: Secondary | ICD-10-CM

## 2021-08-23 NOTE — Patient Instructions (Signed)
Medication Instructions:  Your physician recommends that you continue on your current medications as directed. Please refer to the Current Medication list given to you today.  *If you need a refill on your cardiac medications before your next appointment, please call your pharmacy*   Lab Work: NONE If you have labs (blood work) drawn today and your tests are completely normal, you will receive your results only by: [place] (if you have MyChart) OR A paper copy in the mail If you have any lab test that is abnormal or we need to change your treatment, we will call you to review the results.   Testing/Procedures: NONE   Follow-Up: At [facility], you and your health needs are our priority.  As part of our continuing mission to provide you with exceptional heart care, we have created designated Provider Care Teams.  These Care Teams include your primary Cardiologist (physician) and Advanced Practice Providers (APPs -  Physician Assistants and Nurse Practitioners) who all work together to provide you with the care you need, when you need it.    Your next appointment:   12 month(s)  The format for your next appointment:   In Person  Provider:   Werner Lean, MD

## 2021-12-29 ENCOUNTER — Other Ambulatory Visit: Payer: Self-pay | Admitting: Internal Medicine

## 2022-01-07 ENCOUNTER — Other Ambulatory Visit: Payer: Self-pay | Admitting: Family Medicine

## 2022-01-07 DIAGNOSIS — J4530 Mild persistent asthma, uncomplicated: Secondary | ICD-10-CM

## 2022-01-26 ENCOUNTER — Other Ambulatory Visit: Payer: Self-pay | Admitting: Internal Medicine

## 2022-02-04 ENCOUNTER — Other Ambulatory Visit: Payer: Self-pay | Admitting: Family Medicine

## 2022-02-04 DIAGNOSIS — J4530 Mild persistent asthma, uncomplicated: Secondary | ICD-10-CM

## 2022-02-27 ENCOUNTER — Other Ambulatory Visit: Payer: Self-pay | Admitting: Family Medicine

## 2022-02-27 DIAGNOSIS — J4530 Mild persistent asthma, uncomplicated: Secondary | ICD-10-CM

## 2022-06-01 ENCOUNTER — Other Ambulatory Visit: Payer: Self-pay | Admitting: Family Medicine

## 2022-06-01 DIAGNOSIS — J4530 Mild persistent asthma, uncomplicated: Secondary | ICD-10-CM

## 2022-09-01 ENCOUNTER — Other Ambulatory Visit: Payer: Self-pay | Admitting: Family Medicine

## 2022-09-01 DIAGNOSIS — J4530 Mild persistent asthma, uncomplicated: Secondary | ICD-10-CM

## 2022-09-03 ENCOUNTER — Encounter: Payer: Self-pay | Admitting: *Deleted

## 2022-09-19 ENCOUNTER — Encounter: Payer: Self-pay | Admitting: *Deleted

## 2022-09-21 ENCOUNTER — Encounter: Payer: Self-pay | Admitting: Obstetrics and Gynecology

## 2022-09-21 ENCOUNTER — Other Ambulatory Visit (HOSPITAL_COMMUNITY)
Admission: RE | Admit: 2022-09-21 | Discharge: 2022-09-21 | Disposition: A | Discharge status: Home / Self Care | Payer: BC Managed Care – PPO | Source: Ambulatory Visit | Attending: Obstetrics and Gynecology | Admitting: Obstetrics and Gynecology

## 2022-09-21 ENCOUNTER — Ambulatory Visit (INDEPENDENT_AMBULATORY_CARE_PROVIDER_SITE_OTHER): Payer: BC Managed Care – PPO | Admitting: Obstetrics and Gynecology

## 2022-09-21 ENCOUNTER — Telehealth: Payer: Self-pay

## 2022-09-21 VITALS — BP 122/82 | HR 76 | Ht 65.5 in | Wt 171.0 lb

## 2022-09-21 DIAGNOSIS — Z01419 Encounter for gynecological examination (general) (routine) without abnormal findings: Secondary | ICD-10-CM | POA: Diagnosis not present

## 2022-09-21 DIAGNOSIS — Z1211 Encounter for screening for malignant neoplasm of colon: Secondary | ICD-10-CM | POA: Insufficient documentation

## 2022-09-21 DIAGNOSIS — E2839 Other primary ovarian failure: Secondary | ICD-10-CM | POA: Diagnosis not present

## 2022-09-21 DIAGNOSIS — Z124 Encounter for screening for malignant neoplasm of cervix: Secondary | ICD-10-CM | POA: Insufficient documentation

## 2022-09-21 DIAGNOSIS — K219 Gastro-esophageal reflux disease without esophagitis: Secondary | ICD-10-CM | POA: Insufficient documentation

## 2022-09-21 DIAGNOSIS — M8580 Other specified disorders of bone density and structure, unspecified site: Secondary | ICD-10-CM | POA: Diagnosis not present

## 2022-09-21 NOTE — Progress Notes (Signed)
62 y.o. G27P2002 Married Other or two or more races Not Hispanic or Latino female here for annual exam.  No vaginal bleeding, no dyspareunia.  Mild GSI, small amounts. Typically wears a liner. Leaks a few drops. Rare urge incontinence, with a full bladder. Up 1 x a night to void, has trouble falling back asleep.   She takes stool softeners and has normal BMs qd.    H/O osteoporosis, previously didn't tolerate oral bisphosphonate. Last counseled in 5/18 and declined treatment.  Last DEXA with Endocrinology in 12/22/19 (care everywhere)  T score -2.4, FRAX 10.3/1.6   Patient's last menstrual period was 07/22/2013.          Sexually active: Yes.    The current method of family planning is post menopausal status.    Exercising: Yes.     Walking  Smoker:  no  Health Maintenance: Pap:  10/12/15 WNL Hr HPV neg, 09/22/13 WNL  History of abnormal Pap:  no MMG:  08/2022 normal per patient [facility].  BMD:   12/10/16 osteoporosis, T score -2.5 Colonoscopy: 07/19/14, f/u 10 years TDaP:  10/12/15 Gardasil: n/a   reports that she has quit smoking. Her smoking use included cigarettes. She has never used smokeless tobacco. She reports current drug use. Frequency: 1.00 time per week. Drug: Marijuana. She reports that she does not drink alcohol. Retired, she was a Network engineer at [employer]. She has 2 daughters, 2 granddaughters. Everyone is local.   Past Medical History:  Diagnosis Date   Asthma    adult onset, confirmed with pre- and post-bronchodialator PFTs 06/13/2017 at PCP.   Menorrhagia 2014   Osteoporosis    Paroxysmal atrial fibrillation (HCC)    Paroxysmal SVT (supraventricular tachycardia)     Past Surgical History:  Procedure Laterality Date   SVT ABLATION N/A 07/04/2020   Procedure: SVT ABLATION;  Surgeon: Evans Lance, MD;  Location: [place] [facility];  Service: Cardiovascular;  Laterality: N/A;    Current Outpatient Medications  Medication Sig Dispense Refill   albuterol (VENTOLIN  HFA) 108 (90 Base) MCG/ACT inhaler 2 PUFF EVERY 4 HRS AS NEEDED 25.5 each 1   Albuterol Sulfate (PROAIR RESPICLICK) 123XX123 (90 Base) MCG/ACT AEPB Inhale 2 puffs into the lungs every 4 (four) hours as needed. 3 each 3   budesonide-formoterol (SYMBICORT) 160-4.5 MCG/ACT inhaler INHALE 2 PUFFS INTO THE LUNGS TWICE A DAY 10.2 each 2   CALCIUM-VITAMIN D PO Take by mouth.     diltiazem (CARDIZEM CD) 240 MG 24 hr capsule TAKE 1 CAPSULE (240 MG TOTAL) BY MOUTH AT BEDTIME. 90 capsule 3   diltiazem (CARDIZEM) 30 MG tablet Take 1 tablet (30 mg total) by mouth 3 (three) times daily as needed. At onset of fast heart rate 90 tablet 3   docusate sodium (COLACE) 100 MG capsule Take 100 mg by mouth at bedtime.     EQ ASPIRIN ADULT LOW DOSE 81 MG EC tablet Take 1 tablet by mouth once daily 90 tablet 2   meloxicam (MOBIC) 7.5 MG tablet Take 7.5 mg by mouth daily as needed for pain.      No current facility-administered medications for this visit.    Family History  Problem Relation Age of Onset   Hypertension Mother    Heart disease Mother        stent placement   Hyperlipidemia Mother    Diabetes Maternal Aunt    Heart disease Maternal Grandmother     Review of Systems  All other systems  reviewed and are negative.   Exam:   BP 122/82   Pulse 76   Ht 5' 5.5" (1.664 m)   Wt 171 lb (77.6 kg)   LMP 07/22/2013   SpO2 100%   BMI 28.02 kg/m   Weight change: @WEIGHTCHANGE$ @ Height:   Height: 5' 5.5" (166.4 cm)  Ht Readings from Last 3 Encounters:  09/21/22 5' 5.5" (1.664 m)  08/23/21 5' 6"$  (1.676 m)  05/22/21 5' 6"$  (1.676 m)    General appearance: alert, cooperative and appears stated age Head: Normocephalic, without obvious abnormality, atraumatic Neck: no adenopathy, supple, symmetrical, trachea midline and thyroid normal to inspection and palpation Lungs: clear to auscultation bilaterally Cardiovascular: regular rate and rhythm Breasts: normal appearance, no masses or tenderness Abdomen: soft,  non-tender; non distended,  no masses,  no organomegaly Extremities: extremities normal, atraumatic, no cyanosis or edema Skin: Skin color, texture, turgor normal. No rashes or lesions Lymph nodes: Cervical, supraclavicular, and axillary nodes normal. No abnormal inguinal nodes palpated Neurologic: Grossly normal   Pelvic: External genitalia:  no lesions              Urethra:  normal appearing urethra with no masses, tenderness or lesions              Bartholins and Skenes: normal                 Vagina: normal appearing vagina with normal color and discharge, no lesions              Cervix: no lesions               Bimanual Exam:  Uterus:   no masses or tenderness              Adnexa: no mass, fullness, tenderness               Rectovaginal: Confirms               Anus:  normal sphincter tone, no lesions  Santiago Glad, CMA chaperoned for the exam.  1. Well woman exam Discussed breast self exam Discussed calcium and vit D intake Labs with primary Mammogram UTD (per patient) Colonoscopy due in 12/25  2. Screening for cervical cancer - Cytology - PAP  3. Osteopenia, unspecified location Will order a DEXA at [facility]  4. Hypoestrogenism

## 2022-09-21 NOTE — Telephone Encounter (Signed)
-----   Message from Salvadore Dom, MD sent at 09/21/2022 10:27 AM EST ----- Please set her up for a DEXA at [facility] (where she had her last one). H/O osteopenia, hypo estrogen. Thanks, Sharee Pimple

## 2022-09-21 NOTE — Patient Instructions (Signed)
EXERCISE   We recommended that you start or continue a regular exercise program for good health. Physical activity is anything that gets your body moving, some is better than none. The CDC recommends 150 minutes per week of Moderate-Intensity Aerobic Activity and 2 or more days of Muscle Strengthening Activity.  Benefits of exercise are limitless: helps weight loss/weight maintenance, improves mood and energy, helps with depression and anxiety, improves sleep, tones and strengthens muscles, improves balance, improves bone density, protects from chronic conditions such as heart disease, high blood pressure and diabetes and so much more. To learn more visit: https://www.cdc.gov/physicalactivity/index.html  DIET: Good nutrition starts with a healthy diet of fruits, vegetables, whole grains, and lean protein sources. Drink plenty of water for hydration. Minimize empty calories, sodium, sweets. For more information about dietary recommendations visit: https://health.gov/our-work/nutrition-physical-activity/dietary-guidelines and https://www.myplate.gov/  ALCOHOL:  Women should limit their alcohol intake to no more than 7 drinks/beers/glasses of wine (combined, not each!) per week. Moderation of alcohol intake to this level decreases your risk of breast cancer and liver damage.  If you are concerned that you may have a problem, or your friends have told you they are concerned about your drinking, there are many resources to help. A well-known program that is free, effective, and available to all people all over the nation is Alcoholics Anonymous.  Check out this site to learn more: https://www.aa.org/   CALCIUM AND VITAMIN D:  Adequate intake of calcium and Vitamin D are recommended for bone health.  You should be getting between 1000-1200 mg of calcium and 800 units of Vitamin D daily between diet and supplements  PAP SMEARS:  Pap smears, to check for cervical cancer or precancers,  have traditionally been  done yearly, scientific advances have shown that most women can have pap smears less often.  However, every woman still should have a physical exam from her gynecologist every year. It will include a breast check, inspection of the vulva and vagina to check for abnormal growths or skin changes, a visual exam of the cervix, and then an exam to evaluate the size and shape of the uterus and ovaries. We will also provide age appropriate advice regarding health maintenance, like when you should have certain vaccines, screening for sexually transmitted diseases, bone density testing, colonoscopy, mammograms, etc.   MAMMOGRAMS:  All women over 40 years old should have a routine mammogram.   COLON CANCER SCREENING: Now recommend starting at age 45. At this time colonoscopy is not covered for routine screening until 50. There are take home tests that can be done between 45-49.   COLONOSCOPY:  Colonoscopy to screen for colon cancer is recommended for all women at age 50.  We know, you hate the idea of the prep.  We agree, BUT, having colon cancer and not knowing it is worse!!  Colon cancer so often starts as a polyp that can be seen and removed at colonscopy, which can quite literally save your life!  And if your first colonoscopy is normal and you have no family history of colon cancer, most women don't have to have it again for 10 years.  Once every ten years, you can do something that may end up saving your life, right?  We will be happy to help you get it scheduled when you are ready.  Be sure to check your insurance coverage so you understand how much it will cost.  It may be covered as a preventative service at no cost, but you should check   your particular policy.      Breast Self-Awareness Breast self-awareness means being familiar with how your breasts look and feel. It involves checking your breasts regularly and reporting any changes to your health care provider. Practicing breast self-awareness is  important. A change in your breasts can be a sign of a serious medical problem. Being familiar with how your breasts look and feel allows you to find any problems early, when treatment is more likely to be successful. All women should practice breast self-awareness, including women who have had breast implants. How to do a breast self-exam One way to learn what is normal for your breasts and whether your breasts are changing is to do a breast self-exam. To do a breast self-exam: Look for Changes  Remove all the clothing above your waist. Stand in front of a mirror in a room with good lighting. Put your hands on your hips. Push your hands firmly downward. Compare your breasts in the mirror. Look for differences between them (asymmetry), such as: Differences in shape. Differences in size. Puckers, dips, and bumps in one breast and not the other. Look at each breast for changes in your skin, such as: Redness. Scaly areas. Look for changes in your nipples, such as: Discharge. Bleeding. Dimpling. Redness. A change in position. Feel for Changes Carefully feel your breasts for lumps and changes. It is best to do this while lying on your back on the floor and again while sitting or standing in the shower or tub with soapy water on your skin. Feel each breast in the following way: Place the arm on the side of the breast you are examining above your head. Feel your breast with the other hand. Start in the nipple area and make  inch (2 cm) overlapping circles to feel your breast. Use the pads of your three middle fingers to do this. Apply light pressure, then medium pressure, then firm pressure. The light pressure will allow you to feel the tissue closest to the skin. The medium pressure will allow you to feel the tissue that is a little deeper. The firm pressure will allow you to feel the tissue close to the ribs. Continue the overlapping circles, moving downward over the breast until you feel your  ribs below your breast. Move one finger-width toward the center of the body. Continue to use the  inch (2 cm) overlapping circles to feel your breast as you move slowly up toward your collarbone. Continue the up and down exam using all three pressures until you reach your armpit.  Write Down What You Find  Write down what is normal for each breast and any changes that you find. Keep a written record with breast changes or normal findings for each breast. By writing this information down, you do not need to depend only on memory for size, tenderness, or location. Write down where you are in your menstrual cycle, if you are still menstruating. If you are having trouble noticing differences in your breasts, do not get discouraged. With time you will become more familiar with the variations in your breasts and more comfortable with the exam. How often should I examine my breasts? Examine your breasts every month. If you are breastfeeding, the best time to examine your breasts is after a feeding or after using a breast pump. If you menstruate, the best time to examine your breasts is 5-7 days after your period is over. During your period, your breasts are lumpier, and it may be more   difficult to notice changes. When should I see my health care provider? See your health care provider if you notice: A change in shape or size of your breasts or nipples. A change in the skin of your breast or nipples, such as a reddened or scaly area. Unusual discharge from your nipples. A lump or thick area that was not there before. Pain in your breasts. Anything that concerns you. Kegel Exercises  Kegel exercises can help strengthen your pelvic floor muscles. The pelvic floor is a group of muscles that support your rectum, small intestine, and bladder. In females, pelvic floor muscles also help support the uterus. These muscles help you control the flow of urine and stool (feces). Kegel exercises are painless and  simple. They do not require any equipment. Your provider may suggest Kegel exercises to: Improve bladder and bowel control. Improve sexual response. Improve weak pelvic floor muscles after surgery to remove the uterus (hysterectomy) or after pregnancy, in females. Improve weak pelvic floor muscles after prostate gland removal or surgery, in males. Kegel exercises involve squeezing your pelvic floor muscles. These are the same muscles you squeeze when you try to stop the flow of urine or keep from passing gas. The exercises can be done while sitting, standing, or lying down, but it is best to vary your position. Ask your health care provider which exercises are safe for you. Do exercises exactly as told by your health care provider and adjust them as directed. Do not begin these exercises until told by your health care provider. Exercises How to do Kegel exercises: Squeeze your pelvic floor muscles tight. You should feel a tight lift in your rectal area. If you are a female, you should also feel a tightness in your vaginal area. Keep your stomach, buttocks, and legs relaxed. Hold the muscles tight for up to 10 seconds. Breathe normally. Relax your muscles for up to 10 seconds. Repeat as told by your health care provider. Repeat this exercise daily as told by your health care provider. Continue to do this exercise for at least 4-6 weeks, or for as long as told by your health care provider. You may be referred to a physical therapist who can help you learn more about how to do Kegel exercises. Depending on your condition, your health care provider may recommend: Varying how long you squeeze your muscles. Doing several sets of exercises every day. Doing exercises for several weeks. Making Kegel exercises a part of your regular exercise routine. This information is not intended to replace advice given to you by your health care provider. Make sure you discuss any questions you have with your health  care provider. Document Revised: 12/08/2020 Document Reviewed: 12/08/2020 Elsevier Patient Education  [employer].

## 2022-09-24 NOTE — Telephone Encounter (Signed)
Order signed and faxed successfully on 09/21/2022.

## 2022-09-25 LAB — CYTOLOGY - PAP
Comment: NEGATIVE
Diagnosis: NEGATIVE
High risk HPV: NEGATIVE

## 2022-10-08 NOTE — Telephone Encounter (Signed)
Per [facility] @ [facility], they have made 3 attempts to reach pt to schedule and have not been successful so they took order out of active requests for now but can place back if pt calls to schedule.   Left detailed msg on VM per DPR inquiring about DEXA order and pt's plans to schedule.

## 2022-10-17 ENCOUNTER — Other Ambulatory Visit: Payer: Self-pay | Admitting: Family Medicine

## 2022-10-17 DIAGNOSIS — J4530 Mild persistent asthma, uncomplicated: Secondary | ICD-10-CM

## 2022-10-25 ENCOUNTER — Encounter: Payer: Self-pay | Admitting: Obstetrics and Gynecology

## 2022-11-08 NOTE — Telephone Encounter (Signed)
DEXA scan performed on 10/25/22. Results in care everywhere. Will forward to provider for final review and close encounter.

## 2022-11-19 NOTE — Progress Notes (Unsigned)
[facility] [address] [place], [place] [address] [phone]  Date:  11/21/2022   Name:  Alexandra Grant   DOB:  07-04-1961   MRN:  [id number]  PCP:  Pearline Cables, MD    Chief Complaint: No chief complaint on file.   History of Present Illness:  Alexandra Grant is a 62 y.o. 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  She is married to Page-they have 2 adult daughters and 2 grandchildren  She saw her gynecologist, Dr. Oscar La just recently in February.  Dr. Oscar La is retiring however Most recent visit with cardiology January 2023: PAF, non further s/p ablation on only ASA seeing Dr. Ladona Ridgel continue 240 mg PO Daily Cardizem and PRN - ASCVD risk of 3.5% but with FH early disease in mother - Zero CAC, LDL goal < 100; patient has concerns on medications, given her newly retired status, she is planning to make some lifestyle changes  Former smoker with about a 15-pack-year history Recommend COVID booster Mammogram Colon cancer screening is due next year Pap is up-to-date Most recent labs on chart from October 2022 She also had a coronary calcium score of 0 October 2022  Diltiazem CD 240 at bedtime Immediate release as needed Low-dose  aspirin Patient Active Problem List   Diagnosis Date Noted   Colon cancer screening 09/21/2022   Gastroesophageal reflux disease 09/21/2022   Mixed hyperlipidemia 08/23/2021   Pure hypercholesterolemia 05/22/2021   Elevated blood pressure reading 05/22/2021   Pruritus of vulva 04/21/2021   Insomnia 09/15/2020   Palpitations 06/17/2020   Family history of osteoporosis 07/30/2019   Increased body mass index 07/30/2019    Anxiety 07/13/2019   Menopausal flushing 07/13/2019   Vitamin D deficiency 07/13/2019   Paroxysmal supraventricular tachycardia 09/11/2017   Paroxysmal atrial fibrillation 09/11/2017   Asthma in adult, moderate persistent, uncomplicated 06/13/2017   Osteoporosis without current pathological fracture 12/24/2016   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   Menorrhagia 2014   Osteoporosis    Paroxysmal atrial fibrillation (HCC)    Paroxysmal SVT (supraventricular tachycardia)     Past Surgical History:  Procedure Laterality Date   SVT ABLATION N/A 07/04/2020   Procedure: SVT ABLATION;  Surgeon: Marinus Maw, MD;  Location: [facility];  Service: Cardiovascular;  Laterality: N/A;    Social History   Tobacco Use   Smoking status: Former    Years: 20    Types: Cigarettes   Smokeless tobacco: Never   Tobacco comments:    QUIT 2015  Vaping Use   Vaping Use: Never used  Substance Use Topics   Alcohol use: No   Drug use: Yes    Frequency: 1.0 times per week    Types: Marijuana    Family History  Problem Relation Age of Onset   Hypertension Mother    Heart disease Mother        stent placement   Hyperlipidemia Mother    Diabetes Maternal Aunt    Heart disease  Maternal Grandmother     No Known Allergies  Medication list has been reviewed and updated.  Current Outpatient Medications on File Prior to Visit  Medication Sig Dispense Refill   albuterol (VENTOLIN HFA) 108 (90 Base) MCG/ACT inhaler 2 PUFF EVERY 4 HRS AS NEEDED 25.5 each 1   Albuterol Sulfate (PROAIR RESPICLICK) 108 (90 Base) MCG/ACT AEPB Inhale 2 puffs into the lungs every 4 (four) hours as needed. 3 each 3   budesonide-formoterol (SYMBICORT) 160-4.5 MCG/ACT inhaler INHALE 2 PUFFS INTO THE LUNGS TWICE A DAY 10.2 each 2    CALCIUM-VITAMIN D PO Take by mouth.     diltiazem (CARDIZEM CD) 240 MG 24 hr capsule TAKE 1 CAPSULE (240 MG TOTAL) BY MOUTH AT BEDTIME. 90 capsule 3   diltiazem (CARDIZEM) 30 MG tablet Take 1 tablet (30 mg total) by mouth 3 (three) times daily as needed. At onset of fast heart rate 90 tablet 3   docusate sodium (COLACE) 100 MG capsule Take 100 mg by mouth at bedtime.     EQ ASPIRIN ADULT LOW DOSE 81 MG EC tablet Take 1 tablet by mouth once daily 90 tablet 2   meloxicam (MOBIC) 7.5 MG tablet Take 7.5 mg by mouth daily as needed for pain.      No current facility-administered medications on file prior to visit.    Review of Systems:  As per HPI- otherwise negative.   Physical Examination: There were no vitals filed for this visit. There were no vitals filed for this visit. There is no height or weight on file to calculate BMI. Ideal Body Weight:    GEN: no acute distress. HEENT: Atraumatic, Normocephalic.  Ears and Nose: No external deformity. CV: RRR, No M/G/R. No JVD. No thrill. No extra heart sounds. PULM: CTA B, no wheezes, crackles, rhonchi. No retractions. No resp. distress. No accessory muscle use. ABD: S, NT, ND, +BS. No rebound. No HSM. EXTR: No c/c/e PSYCH: Normally interactive. Conversant.    Assessment and Plan: *** Physical exam.  Encouraged healthy diet exercise routine Will plan further follow- up pending labs.  Signed Abbe Amsterdam, MD

## 2022-11-19 NOTE — Patient Instructions (Signed)
It was good to see you again today, I will be in touch with your labs soon as possible Please touch base with [facility] as I think you may be due for a mammogram

## 2022-11-21 ENCOUNTER — Ambulatory Visit (INDEPENDENT_AMBULATORY_CARE_PROVIDER_SITE_OTHER): Payer: BC Managed Care – PPO | Admitting: Family Medicine

## 2022-11-21 ENCOUNTER — Other Ambulatory Visit: Payer: Self-pay | Admitting: Family Medicine

## 2022-11-21 ENCOUNTER — Encounter: Payer: Self-pay | Admitting: Family Medicine

## 2022-11-21 VITALS — BP 118/62 | HR 61 | Temp 97.6°F | Resp 18 | Ht 66.0 in | Wt 169.8 lb

## 2022-11-21 DIAGNOSIS — I471 Supraventricular tachycardia, unspecified: Secondary | ICD-10-CM | POA: Diagnosis not present

## 2022-11-21 DIAGNOSIS — Z0000 Encounter for general adult medical examination without abnormal findings: Secondary | ICD-10-CM | POA: Diagnosis not present

## 2022-11-21 DIAGNOSIS — J4530 Mild persistent asthma, uncomplicated: Secondary | ICD-10-CM | POA: Diagnosis not present

## 2022-11-21 DIAGNOSIS — E559 Vitamin D deficiency, unspecified: Secondary | ICD-10-CM

## 2022-11-21 DIAGNOSIS — M2550 Pain in unspecified joint: Secondary | ICD-10-CM

## 2022-11-21 DIAGNOSIS — Z13220 Encounter for screening for lipoid disorders: Secondary | ICD-10-CM | POA: Diagnosis not present

## 2022-11-21 DIAGNOSIS — Z131 Encounter for screening for diabetes mellitus: Secondary | ICD-10-CM | POA: Diagnosis not present

## 2022-11-21 DIAGNOSIS — Z1329 Encounter for screening for other suspected endocrine disorder: Secondary | ICD-10-CM | POA: Diagnosis not present

## 2022-11-21 DIAGNOSIS — Z130 Encounter for screening for diseases of the blood and blood-forming organs and certain disorders involving the immune mechanism: Secondary | ICD-10-CM

## 2022-11-21 DIAGNOSIS — Z87891 Personal history of nicotine dependence: Secondary | ICD-10-CM

## 2022-11-21 LAB — CBC
HCT: 43.4 % (ref 36.0–46.0)
Hemoglobin: 14.7 g/dL (ref 12.0–15.0)
MCHC: 34 g/dL (ref 30.0–36.0)
MCV: 94.2 fl (ref 78.0–100.0)
Platelets: 308 10*3/uL (ref 150.0–400.0)
RBC: 4.6 Mil/uL (ref 3.87–5.11)
RDW: 12.6 % (ref 11.5–15.5)
WBC: 6.2 10*3/uL (ref 4.0–10.5)

## 2022-11-21 LAB — VITAMIN D 25 HYDROXY (VIT D DEFICIENCY, FRACTURES): VITD: 28.91 ng/mL — ABNORMAL LOW (ref 30.00–100.00)

## 2022-11-21 LAB — COMPREHENSIVE METABOLIC PANEL
ALT: 18 U/L (ref 0–35)
AST: 15 U/L (ref 0–37)
Albumin: 4.6 g/dL (ref 3.5–5.2)
Alkaline Phosphatase: 80 U/L (ref 39–117)
BUN: 14 mg/dL (ref 6–23)
CO2: 28 mEq/L (ref 19–32)
Calcium: 9.6 mg/dL (ref 8.4–10.5)
Chloride: 104 mEq/L (ref 96–112)
Creatinine, Ser: 0.77 mg/dL (ref 0.40–1.20)
GFR: 83.79 mL/min (ref >60.00)
Glucose, Bld: 95 mg/dL (ref 70–99)
Potassium: 4 mEq/L (ref 3.5–5.1)
Sodium: 141 mEq/L (ref 135–145)
Total Bilirubin: 0.9 mg/dL (ref 0.2–1.2)
Total Protein: 7.1 g/dL (ref 6.0–8.3)

## 2022-11-21 LAB — LIPID PANEL
Cholesterol: 232 mg/dL — ABNORMAL HIGH (ref 0–200)
HDL: 69.9 mg/dL (ref >39.00)
LDL Cholesterol: 141 mg/dL — ABNORMAL HIGH (ref 0–99)
NonHDL: 162.14
Total CHOL/HDL Ratio: 3
Triglycerides: 105 mg/dL (ref 0.0–149.0)
VLDL: 21 mg/dL (ref 0.0–40.0)

## 2022-11-21 LAB — TSH: TSH: 0.68 u[IU]/mL (ref 0.35–5.50)

## 2022-11-21 LAB — HEMOGLOBIN A1C: Hgb A1c MFr Bld: 5.6 % (ref 4.6–6.5)

## 2022-11-21 MED ORDER — PROAIR RESPICLICK 108 (90 BASE) MCG/ACT IN AEPB: 2.0000 | INHALATION_SPRAY | RESPIRATORY_TRACT | 3 refills | Status: DC | PRN

## 2022-11-21 MED ORDER — ALBUTEROL SULFATE HFA 108 (90 BASE) MCG/ACT IN AERS
1.0000 | INHALATION_SPRAY | RESPIRATORY_TRACT | 0 refills | Status: DC | PRN
Start: 2022-11-21 — End: 2022-12-14

## 2022-11-21 MED ORDER — MELOXICAM 7.5 MG PO TABS
7.5000 mg | ORAL_TABLET | Freq: Every day | ORAL | 2 refills | Status: AC | PRN
Start: 2022-11-21 — End: ?

## 2022-11-21 NOTE — Telephone Encounter (Signed)
Patient/husband called. They state Respi-click inhaler not covered by insurance and doesn't work as well. They would like Ventolin (old RX in chart) instead. RX sent.

## 2022-11-22 ENCOUNTER — Encounter: Payer: Self-pay | Admitting: Family Medicine

## 2022-12-13 ENCOUNTER — Encounter: Payer: Self-pay | Admitting: Family Medicine

## 2022-12-13 ENCOUNTER — Ambulatory Visit: Payer: BC Managed Care – PPO | Admitting: Family Medicine

## 2022-12-13 VITALS — BP 123/79 | HR 69 | Ht 66.0 in | Wt 170.8 lb

## 2022-12-13 DIAGNOSIS — J029 Acute pharyngitis, unspecified: Secondary | ICD-10-CM

## 2022-12-13 MED ORDER — AMOXICILLIN-POT CLAVULANATE 875-125 MG PO TABS
1.0000 | ORAL_TABLET | Freq: Two times a day (BID) | ORAL | 0 refills | Status: AC
Start: 2022-12-13 — End: 2022-12-20

## 2022-12-13 MED ORDER — PREDNISONE 20 MG PO TABS
40.0000 mg | ORAL_TABLET | Freq: Every day | ORAL | 0 refills | Status: AC
Start: 2022-12-13 — End: 2022-12-18

## 2022-12-13 NOTE — Patient Instructions (Signed)
Strep test negative, but given presentation, let's start with some prednisone. If not helping within 3 days or if you start a running a fever, go ahead and start the antibiotics. Stay well hydrated.   Please contact office for follow-up if symptoms do not improve or worsen. Seek emergency care if symptoms become severe.

## 2022-12-13 NOTE — Progress Notes (Signed)
Acute Office Visit  Subjective:     Patient ID: Alexandra Grant, female    DOB: Jun 14, 1961, 62 y.o.   MRN: [id number]  Chief Complaint  Patient presents with   Sore Throat    Onset 12/09/2022  Hurts worse at night  Had a cold the week before      Patient is in today for sore throat.  Patient reports she has had 3-4 days of sore throat. No other associated symptoms - she denies fevers, chills body aches, rhinorrhea, headaches, cough, sneezing, GI/GU symptoms, ear pain. She has gotten some relief with warm liquids and meloxicam. Pain is described as sharp 5/10 during the day and up to 7-8/10 at night.  No known sick contacts.     ROS All review of systems negative except what is listed in the HPI      Objective:    BP 123/79 (BP Location: Left Arm, Patient Position: Sitting, Cuff Size: Normal)   Pulse 69   Ht 5\' 6"  (1.676 m)   Wt 170 lb 12.8 oz (77.5 kg)   LMP 07/22/2013   SpO2 97%   BMI 27.57 kg/m    Physical Exam Vitals reviewed.  Constitutional:      Appearance: Normal appearance.  HENT:     Right Ear: Tympanic membrane normal.     Left Ear: Tympanic membrane normal.     Nose: No congestion or rhinorrhea.     Mouth/Throat:     Pharynx: Posterior oropharyngeal erythema present.     Tonsils: No tonsillar exudate or tonsillar abscesses.  Eyes:     Conjunctiva/sclera: Conjunctivae normal.  Cardiovascular:     Rate and Rhythm: Normal rate and regular rhythm.     Pulses: Normal pulses.     Heart sounds: Normal heart sounds.  Pulmonary:     Effort: Pulmonary effort is normal.     Breath sounds: Normal breath sounds.  Lymphadenopathy:     Cervical: Cervical adenopathy present.  Skin:    General: Skin is warm and dry.  Neurological:     Mental Status: She is alert and oriented to person, place, and time.  Psychiatric:        Mood and Affect: Mood normal.        Behavior: Behavior normal.        Thought Content: Thought content normal.        Judgment:  Judgment normal.     No results found for any visits on 12/13/22.      Assessment & Plan:   Problem List Items Addressed This Visit   None Visit Diagnoses     Sore throat    -  Primary   Relevant Medications   predniSONE (DELTASONE) 20 MG tablet   amoxicillin-clavulanate (AUGMENTIN) 875-125 MG tablet     Strep test negative, but given presentation, let's start with some prednisone. If not helping within 3 days or if you start a running a fever, go ahead and start the antibiotics. Stay well hydrated.  Patient aware of signs/symptoms requiring further/urgent evaluation.    Meds ordered this encounter  Medications   predniSONE (DELTASONE) 20 MG tablet    Sig: Take 2 tablets (40 mg total) by mouth daily with breakfast for 5 days.    Dispense:  10 tablet    Refill:  0    Order Specific Question:   Supervising Provider    Answer:   Danise Edge A [[id number]]   amoxicillin-clavulanate (AUGMENTIN) 875-125 MG tablet  Sig: Take 1 tablet by mouth 2 (two) times daily for 7 days.    Dispense:  14 tablet    Refill:  0    Order Specific Question:   Supervising Provider    Answer:   Danise Edge A [[id number]]    Return if symptoms worsen or fail to improve.  Clayborne Dana, NP

## 2022-12-14 ENCOUNTER — Other Ambulatory Visit: Payer: Self-pay | Admitting: Family Medicine

## 2022-12-14 DIAGNOSIS — J4530 Mild persistent asthma, uncomplicated: Secondary | ICD-10-CM

## 2022-12-19 ENCOUNTER — Other Ambulatory Visit: Payer: Self-pay | Admitting: Internal Medicine

## 2023-01-01 ENCOUNTER — Encounter: Payer: Self-pay | Admitting: Family Medicine

## 2023-01-01 ENCOUNTER — Telehealth: Payer: BC Managed Care – PPO | Admitting: Family Medicine

## 2023-01-01 VITALS — Ht 66.0 in | Wt 171.0 lb

## 2023-01-01 DIAGNOSIS — J029 Acute pharyngitis, unspecified: Secondary | ICD-10-CM | POA: Diagnosis not present

## 2023-01-01 MED ORDER — NYSTATIN 100000 UNIT/ML MT SUSP
5.0000 mL | Freq: Four times a day (QID) | OROMUCOSAL | 0 refills | Status: DC
Start: 2023-01-01 — End: 2023-03-07

## 2023-01-01 NOTE — Progress Notes (Signed)
Virtual Video Visit via MyChart Note  I connected with  Alexandra Grant on 01/01/23 at 10:20 AM EDT by the video enabled telemedicine application for MyChart, and verified that I am speaking with the correct person using two identifiers.   I introduced myself as a Publishing rights manager with the practice. We discussed the limitations of evaluation and management by telemedicine and the availability of in person appointments. The patient expressed understanding and agreed to proceed.  Participating parties in this visit include: The patient and the nurse practitioner listed. Her husband briefly came on the call as well.  The patient is: At home I am: In the office - [facility]  Subjective:    CC: sore throat  HPI: Alexandra Grant is a 62 y.o. year old female presenting today via MyChart today for sore throat.  Patient was seen in the office on 12/13/22 for sore throat and started on amoxicillin and prednisone due to presentation. States she finished and amoxicillin, but not the prednisone. Reprots she did notice some improvement, but sore throat was still noticeable. Throat pain tends to be worse at night; she spends a lot of her day outside in the yard. Today is the best she's felt, but her husband looked at her throat during video visit and reported still seeing some white spots. She denies any dyspnea, chest pain, fevers, chills, body aches, GI/GU symptoms, rhinorrhea, sneezing, coughing, headache, sinus pain, reflux. States she is usually pretty good at rinsing her mouth out after using Symbicort.      Past medical history, Surgical history, Family history not pertinant except as noted below, Social history, Allergies, and medications have been entered into the medical record, reviewed, and corrections made.   Review of Systems:  All review of systems negative except what is listed in the HPI   Objective:    General:  Speaking clearly in complete  sentences. Absent shortness of breath noted.   Alert and oriented x3.   Normal judgment.  Absent acute distress. Attempted to look at throat/tongue via video but hard to see. Possibly some white patches to soft palate and tongue.   Impression and Recommendations:    1. Sore throat - nystatin (MYCOSTATIN) 100000 UNIT/ML suspension; Take 5 mLs (500,000 Units total) by mouth 4 (four) times daily.  Dispense: 60 mL; Refill: 0  Feeling better today, but husband is still able to see white spots to throat/oropharynx/tongue. Partially visualized on video. Will try nystatin to see if there is a fungal component. Advised she try a daily antihistamine as well to see if any postnasal drainage related to allergies may be causing her throat irritation. Continue supportive measures - rest, OTC analgesics, warm/cold liquids, etc.  Patient aware of signs/symptoms requiring further/urgent evaluation.     Follow-up if symptoms worsen or fail to improve.    I discussed the assessment and treatment plan with the patient. The patient was provided an opportunity to ask questions and all were answered. The patient agreed with the plan and demonstrated an understanding of the instructions.   The patient was advised to call back or seek an in-person evaluation if the symptoms worsen or if the condition fails to improve as anticipated.    Clayborne Dana, NP

## 2023-01-08 ENCOUNTER — Other Ambulatory Visit: Payer: Self-pay | Admitting: Family Medicine

## 2023-01-08 DIAGNOSIS — J4530 Mild persistent asthma, uncomplicated: Secondary | ICD-10-CM

## 2023-02-08 ENCOUNTER — Other Ambulatory Visit: Payer: Self-pay | Admitting: Internal Medicine

## 2023-02-13 ENCOUNTER — Encounter: Payer: Self-pay | Admitting: Obstetrics and Gynecology

## 2023-02-13 DIAGNOSIS — N762 Acute vulvitis: Secondary | ICD-10-CM

## 2023-02-13 MED ORDER — NYSTATIN-TRIAMCINOLONE 100000-0.1 UNIT/GM-% EX CREA
TOPICAL_CREAM | CUTANEOUS | 4 refills | Status: DC
Start: 2023-02-13 — End: 2023-10-17

## 2023-02-13 NOTE — Telephone Encounter (Signed)
AEX UTD. Please advise.

## 2023-02-13 NOTE — Telephone Encounter (Signed)
Per ML: "Yes, can send her Mycolog cream with 4 refills. Dr. Elbert Ewings"   Rx sent. Will route to provider for final review and close.

## 2023-03-07 ENCOUNTER — Other Ambulatory Visit: Payer: Self-pay | Admitting: Family Medicine

## 2023-03-07 DIAGNOSIS — J029 Acute pharyngitis, unspecified: Secondary | ICD-10-CM

## 2023-03-08 MED ORDER — NYSTATIN 100000 UNIT/ML MT SUSP: 5.0000 mL | Freq: Four times a day (QID) | OROMUCOSAL | 0 refills | Status: DC

## 2023-03-12 ENCOUNTER — Encounter: Payer: Self-pay | Admitting: Internal Medicine

## 2023-03-12 ENCOUNTER — Ambulatory Visit: Payer: BC Managed Care – PPO | Admitting: Internal Medicine

## 2023-03-12 ENCOUNTER — Ambulatory Visit: Payer: BC Managed Care – PPO | Attending: Internal Medicine | Admitting: Internal Medicine

## 2023-03-12 VITALS — BP 120/80 | HR 75 | Ht 66.0 in | Wt 165.0 lb

## 2023-03-12 DIAGNOSIS — E782 Mixed hyperlipidemia: Secondary | ICD-10-CM

## 2023-03-12 DIAGNOSIS — I471 Supraventricular tachycardia, unspecified: Secondary | ICD-10-CM | POA: Diagnosis not present

## 2023-03-12 DIAGNOSIS — I480 Paroxysmal atrial fibrillation: Secondary | ICD-10-CM

## 2023-03-12 NOTE — Progress Notes (Signed)
Cardiology Office Note:    Date:  03/12/2023   ID:  Alexandra Grant, DOB 05-11-1961, MRN [id number]  PCP:  Pearline Cables, MD   [facility] Providers Cardiologist:  Christell Constant, MD     Referring MD: Pearline Cables, MD   CC:  F/u HLD and PSVT  History of Present Illness:    Alexandra Grant is a 62 y.o. female with a hx of P-SVT and AT, PAF NOS with prior ablation. HLD managed by Dr. Ladona Ridgel who presented 10/22.   2022: In interim of this visit, patient had negative CAC.   2023: Retired. No further ectopy.  Patient notes that she is doing well.   Since last visit notes rare palpitations (2 times) . There are no interval hospital/ED visit.   EKG showed SR. She notes that when she doesn't eat, she will have later have SVT. Notes dry mouth.  No chest pain or pressure .  No SOB/DOE and no PND/Orthopnea.  No weight gain or leg swelling.    Past Medical History:  Diagnosis Date   Asthma    adult onset, confirmed with pre- and post-bronchodialator PFTs 06/13/2017 at PCP.   Menorrhagia 2014   Osteoporosis    Paroxysmal atrial fibrillation (HCC)    Paroxysmal SVT (supraventricular tachycardia)     Past Surgical History:  Procedure Laterality Date   SVT ABLATION N/A 07/04/2020   Procedure: SVT ABLATION;  Surgeon: Marinus Maw, MD;  [facility];  Service: Cardiovascular;  Laterality: N/A;    Current Medications: Current Meds  Medication Sig   albuterol (VENTOLIN HFA) 108 (90 Base) MCG/ACT inhaler Inhale 1-2 puffs into the lungs every 4 (four) hours as needed for wheezing or shortness of breath.   budesonide-formoterol (SYMBICORT) 160-4.5 MCG/ACT inhaler INHALE 2 PUFFS INTO THE LUNGS TWICE A DAY   CALCIUM-VITAMIN D PO Take by mouth.   diltiazem (CARDIZEM CD) 240 MG 24 hr capsule TAKE 1 CAPSULE (240 MG TOTAL) BY MOUTH AT BEDTIME.   diltiazem (CARDIZEM) 30 MG tablet Take 1 tablet (30 mg total) by mouth 3 (three) times daily as needed. At  onset of fast heart rate   docusate sodium (COLACE) 100 MG capsule Take 100 mg by mouth at bedtime.   EQ ASPIRIN ADULT LOW DOSE 81 MG EC tablet Take 1 tablet by mouth once daily   meloxicam (MOBIC) 7.5 MG tablet Take 1 tablet (7.5 mg total) by mouth daily as needed for pain.   nystatin (MYCOSTATIN) 100000 UNIT/ML suspension Take 5 mLs (500,000 Units total) by mouth 4 (four) times daily.   nystatin-triamcinolone (MYCOLOG II) cream Apply externally BID prn for itching.     Allergies:   Patient has no known allergies.   Social History   Socioeconomic History   Marital status: Married    Spouse name: Not on file   Number of children: Not on file   Years of education: Not on file   Highest education level: Not on file  Occupational History   Occupation: Diplomatic Services operational officer    Comment: [employer] for 25 years  Tobacco Use   Smoking status: Former    Types: Cigarettes   Smokeless tobacco: Never   Tobacco comments:    QUIT 2015  Vaping Use   Vaping status: Never Used  Substance and Sexual Activity   Alcohol use: No   Drug use: Yes    Frequency: 1.0 times per week    Types: Marijuana   Sexual activity: Yes  Partners: Male    Birth control/protection: Other-see comments    Comment: vasectomy, INTERCOURSE AGE 17SEXUAL PARTNERS LESS THAN 5  Other Topics Concern   Not on file  Social History Narrative   Married since 1985. Has 2 daughter and 2 granddaughters. Husband is retired Radiation protection practitioner.   Grew up in [place]. Her dad died before she was born. Her stepdad adopted her, had a great childhood. No h/o abuse. Dad was a used Quarry manager. Mom was an Engineer, structural.       Caffeine-none now.   Legal- none   Religious-Christian   Social Determinants of Health   Financial Resource Strain: Low Risk  (10/28/2020)   Overall Financial Resource Strain (CARDIA)    Difficulty of Paying Living Expenses: Not hard at all  Food Insecurity: No Food Insecurity (10/28/2020)   Hunger Vital Sign    Worried About Running  Out of Food in the Last Year: Never true    Ran Out of Food in the Last Year: Never true  Transportation Needs: No Transportation Needs (10/28/2020)   PRAPARE - Administrator, Civil Service (Medical): No    Lack of Transportation (Non-Medical): No  Physical Activity: Inactive (10/28/2020)   Exercise Vital Sign    Days of Exercise per Week: 0 days    Minutes of Exercise per Session: 0 min  Stress: Stress Concern Present (10/28/2020)   [employer] of Occupational Health - Occupational Stress Questionnaire    Feeling of Stress : Rather much  Social Connections: Moderately Integrated (10/28/2020)   Social Connection and Isolation Panel [NHANES]    Frequency of Communication with Friends and Family: More than three times a week    Frequency of Social Gatherings with Friends and Family: More than three times a week    Attends Religious Services: More than 4 times per year    Active Member of [employer] or Organizations: No    Attends Banker Meetings: Never    Marital Status: Married    Social: retired- comes with husband  Family History: The patient's family history includes Diabetes in her maternal aunt; Heart disease in her maternal grandmother and mother; Hyperlipidemia in her mother; Hypertension in her mother. Mother had MI Grandmother had MI, CABG, and post op AF.  ROS:   Please see the history of present illness.     EKGs/Labs/Other Studies Reviewed:    The following studies were reviewed today:  Cardiac Studies & Procedures         MONITORS  LONG TERM MONITOR (3-14 DAYS) 11/24/2020  Narrative  14 episodes of SVT, longest lasting 15 beats  No significant arrhythmias   Patch Wear Time:  13 days and 20 hours (2022-03-26T14:05:37-0400 to 2022-04-09T10:41:58-0400)  Patient had a min HR of 39 bpm, max HR of 174 bpm, and avg HR of 72 bpm. Predominant underlying rhythm was Sinus Rhythm. 14 Supraventricular Tachycardia runs occurred, the run with the  fastest interval lasting 5 beats with a max rate of 174 bpm, the longest lasting 15 beats with an avg rate of 158 bpm. Isolated SVEs were rare (<1.0%), SVE Couplets were rare (<1.0%), and SVE Triplets were rare (<1.0%). Isolated VEs were rare (<1.0%, 101), VE Triplets were rare (<1.0%, 2), and no VE Couplets were present.   CT SCANS  CT CARDIAC SCORING (SELF PAY ONLY) 06/12/2021  Addendum 06/12/2021  1:55 PM ADDENDUM REPORT: 06/12/2021 13:53  CLINICAL DATA:  Cardiovascular Disease Risk stratification  EXAM: Coronary Calcium Score  TECHNIQUE: A gated,  non-contrast computed tomography scan of the heart was performed using 3mm slice thickness. Axial images were analyzed on a dedicated workstation. Calcium scoring of the coronary arteries was performed using the Agatston method.  FINDINGS: Coronary arteries: Normal origins.  Coronary Calcium Score:  Left main: 0  Left anterior descending artery: 0  Left circumflex artery: 0  Right coronary artery: 0  Total: 0  Pericardium: Normal.  Ascending Aorta: Normal caliber.  Non-cardiac: See separate report from [facility].  IMPRESSION: Coronary calcium score of 0 Agatston units. This suggests low risk for future cardiac events.  RECOMMENDATIONS: Coronary artery calcium (CAC) score is a strong predictor of incident coronary heart disease (CHD) and provides predictive information beyond traditional risk factors. CAC scoring is reasonable to use in the decision to withhold, postpone, or initiate statin therapy in intermediate-risk or selected borderline-risk asymptomatic adults (age 40-75 years and LDL-C >=70 to <190 mg/dL) who do not have diabetes or established atherosclerotic cardiovascular disease (ASCVD).* In intermediate-risk (10-year ASCVD risk >=7.5% to <20%) adults or selected borderline-risk (10-year ASCVD risk >=5% to <7.5%) adults in whom a CAC score is measured for the purpose of making a treatment  decision the following recommendations have been made:  If CAC=0, it is reasonable to withhold statin therapy and reassess in 5 to 10 years, as long as higher risk conditions are absent (diabetes mellitus, family history of premature CHD in first degree relatives (males <55 years; females <65 years), cigarette smoking, or LDL >=190 mg/dL).  If CAC is 1 to 99, it is reasonable to initiate statin therapy for patients >=29 years of age.  If CAC is >=100 or >=75th percentile, it is reasonable to initiate statin therapy at any age.  Cardiology referral should be considered for patients with CAC scores >=400 or >=75th percentile.  *2018 AHA/ACC/AACVPR/AAPA/ABC/ACPM/ADA/AGS/APhA/ASPC/NLA/PCNA Guideline on the Management of Blood Cholesterol: A Report of the American College of Cardiology/American Heart Association Task Force on Clinical Practice Guidelines. J Am Coll Cardiol. 2019;73(24):3168-3209.  Marca Ancona, MD   Electronically Signed By: Marca Ancona M.D. On: 06/12/2021 13:53  Narrative EXAM: OVER-READ INTERPRETATION  CT CHEST  The following report is an over-read performed by radiologist Dr. Trudie Reed of [facility] on 06/12/2021. This over-read does not include interpretation of cardiac or coronary anatomy or pathology. The coronary calcium score interpretation by the cardiologist is attached.  COMPARISON:  None.  FINDINGS: Within the visualized portions of the thorax there are no suspicious appearing pulmonary nodules or masses, there is no acute consolidative airspace disease, no pleural effusions, no pneumothorax and no lymphadenopathy. Visualized portions of the upper abdomen are unremarkable. There are no aggressive appearing lytic or blastic lesions noted in the visualized portions of the skeleton.  IMPRESSION: 1. No significant incidental noncardiac findings are noted.  Electronically Signed: By: Trudie Reed M.D. On:  06/12/2021 09:38           Recent Labs: 11/21/2022: ALT 18; BUN 14; Creatinine, Ser 0.77; Hemoglobin 14.7; Platelets 308.0; Potassium 4.0; Sodium 141; TSH 0.68  Recent Lipid Panel    Component Value Date/Time   CHOL 232 (H) 11/21/2022 0954   TRIG 105.0 11/21/2022 0954   HDL 69.90 11/21/2022 0954   CHOLHDL 3 11/21/2022 0954   VLDL 21.0 11/21/2022 0954   LDLCALC 141 (H) 11/21/2022 0954        Physical Exam:    VS:  BP 120/80   Pulse 75   Ht 5\' 6"  (1.676 m)   Wt 165 lb (  74.8 kg)   LMP 07/22/2013   SpO2 95%   BMI 26.63 kg/m     Wt Readings from Last 3 Encounters:  03/12/23 165 lb (74.8 kg)  01/01/23 171 lb (77.6 kg)  12/13/22 170 lb 12.8 oz (77.5 kg)    Gen: No distress  Neck: No JVD Cardiac: No Rubs or Gallops, no murmur, regular rhythm, +2 radial pulses Respiratory: Clear to auscultation bilaterally, normal effort, normal  respiratory rate GI: Soft, nontender, non-distended  MS: No  edema;  moves all extremities Integument: Skin feels warm Neuro:  At time of evaluation, alert and oriented to person/place/time/situation  Psych: Normal affect, patient feels well   ASSESSMENT:    1. Paroxysmal atrial fibrillation (HCC)   2. Paroxysmal supraventricular tachycardia   3. Mixed hyperlipidemia     PLAN:    HLD - LDL still above goal (LDL < 100) - goal of 150 minutes on treadmill - will make diet changes - lipids and Lp(a) in 3 months and if still above goal will get rosustatin  Diet Prescription Type: Mediterranean Calorie goal: 1800 Weight loss goal if applicable: NA Limitations: her kitchen flooring is being remodelled Meal plan: Created one day meal plan and gave online resources for weekly planning; patient is amenable to diet created   P-SVT  PAF, non further s/p ablation on only ASA seeing Dr. Ladona Ridgel - continue 240 mg PO Daily Cardizem and PRN  One year f/u    Medication Adjustments/Labs and Tests Ordered: Current medicines are reviewed at length  with the patient today.  Concerns regarding medicines are outlined above.  Orders Placed This Encounter  Procedures   Lipid panel   Lipoprotein A (LPA)   EKG 12-Lead    No orders of the defined types were placed in this encounter.    Patient Instructions  Medication Instructions:  Your physician recommends that you continue on your current medications as directed. Please refer to the Current Medication list given to you today.  *If you need a refill on your cardiac medications before your next appointment, please call your pharmacy*   Lab Work: IN 3-4 MONTHS: FLP, Lipoprotein A (Nothing to eat or drink 8 hours prior except water)  If you have labs (blood work) drawn today and your tests are completely normal, you will receive your results only by: MyChart Message (if you have MyChart) OR A paper copy in the mail If you have any lab test that is abnormal or we need to change your treatment, we will call you to review the results.   Testing/Procedures: NONE   Follow-Up: At [facility], you and your health needs are our priority.  As part of our continuing mission to provide you with exceptional heart care, we have created designated Provider Care Teams.  These Care Teams include your primary Cardiologist (physician) and Advanced Practice Providers (APPs -  Physician Assistants and Nurse Practitioners) who all work together to provide you with the care you need, when you need it.  We recommend signing up for the patient portal called "MyChart".  Sign up information is provided on this After Visit Summary.  MyChart is used to connect with patients for Virtual Visits (Telemedicine).  Patients are able to view lab/test results, encounter notes, upcoming appointments, etc.  Non-urgent messages can be sent to your provider as well.   To learn more about what you can do with MyChart, go to [email or web address].    Your next appointment:   1 year(s)  Provider:  Riley Lam, MD       Signed, Christell Constant, MD  03/12/2023 1:47 PM    [facility]

## 2023-03-12 NOTE — Patient Instructions (Signed)
Medication Instructions:  Your physician recommends that you continue on your current medications as directed. Please refer to the Current Medication list given to you today.  *If you need a refill on your cardiac medications before your next appointment, please call your pharmacy*   Lab Work: IN 3-4 MONTHS: FLP, Lipoprotein A (Nothing to eat or drink 8 hours prior except water)  If you have labs (blood work) drawn today and your tests are completely normal, you will receive your results only by: MyChart Message (if you have MyChart) OR A paper copy in the mail If you have any lab test that is abnormal or we need to change your treatment, we will call you to review the results.   Testing/Procedures: NONE   Follow-Up: At [facility], you and your health needs are our priority.  As part of our continuing mission to provide you with exceptional heart care, we have created designated Provider Care Teams.  These Care Teams include your primary Cardiologist (physician) and Advanced Practice Providers (APPs -  Physician Assistants and Nurse Practitioners) who all work together to provide you with the care you need, when you need it.  We recommend signing up for the patient portal called "MyChart".  Sign up information is provided on this After Visit Summary.  MyChart is used to connect with patients for Virtual Visits (Telemedicine).  Patients are able to view lab/test results, encounter notes, upcoming appointments, etc.  Non-urgent messages can be sent to your provider as well.   To learn more about what you can do with MyChart, go to [email or web address].    Your next appointment:   1 year(s)  Provider:   Riley Lam, MD

## 2023-03-18 ENCOUNTER — Other Ambulatory Visit: Payer: Self-pay | Admitting: Internal Medicine

## 2023-03-28 ENCOUNTER — Other Ambulatory Visit: Payer: Self-pay | Admitting: Internal Medicine

## 2023-03-29 NOTE — Progress Notes (Unsigned)
[facility] [address] [place], [place] [address] [phone]  Date:  04/03/2023   Name:  Alexandra Grant   DOB:  Dec 04, 1960   MRN:  [id number]  PCP:  Pearline Cables, MD    Chief Complaint: No chief complaint on file.   History of Present Illness:  Alexandra Grant is a 62 y.o. very pleasant female patient who presents with the following:  Patient seen today virtually to discuss potentially seeing a rheumatologist.  Patient location is home, provider location is office.  Patient identity confirmed with 2 factors, she gives consent for virtual visit today The patient and myself are present on the call today  I saw her last in April History of SVT status post ablation with some occasional recurrences  Retired from her long-term work at [employer]  Today pt notes some issues with "arthritis pain for quite a long time" -perhaps 6 years or longer She used to have buttock pain when she was working and sitting a lot; this is actually partially why she retired.  Getting up and moving around helps quite a bit  She may notice intermittent wrist pain and swelling for the last couple of months-occurs on and off, can be in either wrist.  She also notes intermittent shoulder pain Her body overall feels more stiff and sore than it did previously  She notes she tends to get a lot of tick bites -wonders if she could have a screening test for Lyme disease  She is using meloxicam which helps some with her pain - She notes her GM had severe OA but she does not think there is any family history of rheumatoid arthritis  No fever or other systemic symptoms  Finally, she notes a dry mouth for the last couple of weeks.  No changes in her medications or routine Patient Active Problem List   Diagnosis Date Noted   Colon cancer screening 09/21/2022   Gastroesophageal reflux disease 09/21/2022   Mixed hyperlipidemia 08/23/2021   Elevated  blood pressure reading 05/22/2021   Pruritus of vulva 04/21/2021   Insomnia 09/15/2020   Family history of osteoporosis 07/30/2019   Increased body mass index 07/30/2019   Anxiety 07/13/2019   Vitamin D deficiency 07/13/2019   Paroxysmal supraventricular tachycardia 09/11/2017   Paroxysmal atrial fibrillation (HCC) 09/11/2017   Asthma in adult, moderate persistent, uncomplicated 06/13/2017   Osteoporosis without current pathological fracture 12/24/2016   DUB (dysfunctional uterine bleeding) 04/27/2013    Past Medical History:  Diagnosis Date   Asthma    adult onset, confirmed with pre- and post-bronchodialator PFTs 06/13/2017 at PCP.   Menorrhagia 2014   Osteoporosis    Paroxysmal atrial fibrillation (HCC)    Paroxysmal SVT (supraventricular tachycardia)     Past Surgical History:  Procedure Laterality Date   SVT ABLATION N/A 07/04/2020   Procedure: SVT ABLATION;  Surgeon: Marinus Maw, MD;  Location: [facility];  Service: Cardiovascular;  Laterality: N/A;    Social History   Tobacco Use   Smoking status: Former    Types: Cigarettes   Smokeless tobacco: Never   Tobacco comments:    QUIT 2015  Vaping Use   Vaping status: Never Used  Substance Use Topics   Alcohol use: No   Drug use: Yes    Frequency: 1.0 times per week    Types: Marijuana    Family History  Problem Relation Age of Onset  Hypertension Mother    Heart disease Mother        stent placement   Hyperlipidemia Mother    Diabetes Maternal Aunt    Heart disease Maternal Grandmother     No Known Allergies  Medication list has been reviewed and updated.  Current Outpatient Medications on File Prior to Visit  Medication Sig Dispense Refill   albuterol (VENTOLIN HFA) 108 (90 Base) MCG/ACT inhaler Inhale 1-2 puffs into the lungs every 4 (four) hours as needed for wheezing or shortness of breath. 18 g 5   budesonide-formoterol (SYMBICORT) 160-4.5 MCG/ACT inhaler INHALE 2 PUFFS INTO THE LUNGS  TWICE A DAY 10.2 each 2   CALCIUM-VITAMIN D PO Take by mouth.     diltiazem (CARDIZEM CD) 240 MG 24 hr capsule TAKE 1 CAPSULE (240 MG TOTAL) BY MOUTH AT BEDTIME. 90 capsule 1   diltiazem (CARDIZEM) 30 MG tablet Take 1 tablet (30 mg total) by mouth 3 (three) times daily as needed. At onset of fast heart rate 90 tablet 3   docusate sodium (COLACE) 100 MG capsule Take 100 mg by mouth at bedtime.     EQ ASPIRIN ADULT LOW DOSE 81 MG EC tablet Take 1 tablet by mouth once daily 90 tablet 2   meloxicam (MOBIC) 7.5 MG tablet Take 1 tablet (7.5 mg total) by mouth daily as needed for pain. 90 tablet 2   nystatin (MYCOSTATIN) 100000 UNIT/ML suspension Take 5 mLs (500,000 Units total) by mouth 4 (four) times daily. 60 mL 0   nystatin-triamcinolone (MYCOLOG II) cream Apply externally BID prn for itching. 30 g 4   No current facility-administered medications on file prior to visit.    Review of Systems:  As per HPI- otherwise negative.   Physical Examination: There were no vitals filed for this visit. There were no vitals filed for this visit. There is no height or weight on file to calculate BMI. Ideal Body Weight:     Patient observed via video monitor, she looks well No shortness of breath or distress is noted Assessment and Plan: Dry mouth - Plan: Basic metabolic panel  Pain in joint, multiple sites - Plan: B. burgdorfi antibodies, Sedimentation rate, C-reactive protein, Rheumatoid Factor, Cyclic citrul peptide antibody, IgG (QUEST)  Patient seen today virtually for a couple of concerns.  As above, she notes dry mouth.  We did an A1c for her in April which was normal.  No changes in her medications were routine.  Will check her renal function and blood sugar  Also, she notes multiple joint pains, mostly her shoulders and wrists.  She is concerned about possible rheumatoid arthritis.  Will obtain blood work as above and get back with her, we will also do a screening blood test for Lyme disease.   Suggested that she try adding Voltaren gel to her current regimen  Will plan further follow- up pending labs.  She would like to get her labs at [facility], [place]

## 2023-04-03 ENCOUNTER — Telehealth: Payer: BC Managed Care – PPO | Admitting: Family Medicine

## 2023-04-03 ENCOUNTER — Encounter: Payer: Self-pay | Admitting: Family Medicine

## 2023-04-03 ENCOUNTER — Other Ambulatory Visit: Payer: Self-pay | Admitting: Family Medicine

## 2023-04-03 DIAGNOSIS — J4530 Mild persistent asthma, uncomplicated: Secondary | ICD-10-CM

## 2023-04-03 DIAGNOSIS — M2550 Pain in unspecified joint: Secondary | ICD-10-CM | POA: Diagnosis not present

## 2023-04-03 DIAGNOSIS — R682 Dry mouth, unspecified: Secondary | ICD-10-CM

## 2023-04-03 NOTE — Addendum Note (Signed)
Addended by: Mertha Finders on: 04/03/2023 01:26 PM   Modules accepted: Orders

## 2023-04-04 NOTE — Addendum Note (Signed)
Addended by: Rosita Kea on: 04/04/2023 12:41 PM   Modules accepted: Orders

## 2023-04-05 ENCOUNTER — Other Ambulatory Visit (INDEPENDENT_AMBULATORY_CARE_PROVIDER_SITE_OTHER): Payer: BC Managed Care – PPO

## 2023-04-05 DIAGNOSIS — R682 Dry mouth, unspecified: Secondary | ICD-10-CM | POA: Diagnosis not present

## 2023-04-05 DIAGNOSIS — M2550 Pain in unspecified joint: Secondary | ICD-10-CM

## 2023-04-05 LAB — BASIC METABOLIC PANEL
BUN: 17 mg/dL (ref 6–23)
CO2: 26 mEq/L (ref 19–32)
Calcium: 9.5 mg/dL (ref 8.4–10.5)
Chloride: 102 mEq/L (ref 96–112)
Creatinine, Ser: 1.02 mg/dL (ref 0.40–1.20)
GFR: 59.64 mL/min — ABNORMAL LOW (ref >60.00)
Glucose, Bld: 120 mg/dL — ABNORMAL HIGH (ref 70–99)
Potassium: 3.5 mEq/L (ref 3.5–5.1)
Sodium: 138 mEq/L (ref 135–145)

## 2023-04-05 LAB — SEDIMENTATION RATE: Sed Rate: 26 mm/hr (ref 0–30)

## 2023-04-05 LAB — C-REACTIVE PROTEIN: CRP: <1 mg/dL (ref 0.5–20.0)

## 2023-04-07 ENCOUNTER — Encounter: Payer: Self-pay | Admitting: Family Medicine

## 2023-04-07 DIAGNOSIS — Z122 Encounter for screening for malignant neoplasm of respiratory organs: Secondary | ICD-10-CM

## 2023-04-07 DIAGNOSIS — D8989 Other specified disorders involving the immune mechanism, not elsewhere classified: Secondary | ICD-10-CM

## 2023-04-07 DIAGNOSIS — Z5320 Procedure and treatment not carried out because of patient's decision for unspecified reasons: Secondary | ICD-10-CM

## 2023-04-07 DIAGNOSIS — M2550 Pain in unspecified joint: Secondary | ICD-10-CM

## 2023-04-07 DIAGNOSIS — Z1211 Encounter for screening for malignant neoplasm of colon: Secondary | ICD-10-CM

## 2023-04-07 LAB — CYCLIC CITRUL PEPTIDE ANTIBODY, IGG: Cyclic Citrullin Peptide Ab: 74 U — ABNORMAL HIGH

## 2023-04-07 LAB — RHEUMATOID FACTOR: Rheumatoid fact SerPl-aCnc: 113 [IU]/mL — ABNORMAL HIGH (ref <14)

## 2023-04-07 LAB — B. BURGDORFI ANTIBODIES: B burgdorferi Ab IgG+IgM: <0.9 {index}

## 2023-04-08 MED ORDER — PREDNISONE 20 MG PO TABS: ORAL_TABLET | ORAL | 0 refills | Status: DC

## 2023-04-08 NOTE — Addendum Note (Signed)
Addended by: Abbe Amsterdam C on: 04/08/2023 01:59 PM   Modules accepted: Orders

## 2023-05-15 ENCOUNTER — Other Ambulatory Visit: Payer: Self-pay | Admitting: Family Medicine

## 2023-05-15 DIAGNOSIS — M2550 Pain in unspecified joint: Secondary | ICD-10-CM

## 2023-05-20 ENCOUNTER — Ambulatory Visit: Payer: BC Managed Care – PPO | Admitting: Physician Assistant

## 2023-05-25 MED ORDER — PREDNISONE 20 MG PO TABS
ORAL_TABLET | ORAL | 0 refills | Status: DC
Start: 2023-05-25 — End: 2023-06-11

## 2023-05-25 NOTE — Addendum Note (Signed)
Addended by: Abbe Amsterdam C on: 05/25/2023 06:34 PM   Modules accepted: Orders

## 2023-06-10 NOTE — Progress Notes (Signed)
Office Visit Note  Patient: Alexandra Grant             Date of Birth: 1961/07/27           MRN: [id number]             PCP: Pearline Cables, MD Referring: Pearline Cables, MD Visit Date: 06/11/2023 Occupation: @GUAROCC @  Subjective:  Pain in multiple joints  History of Present Illness: Alexandra Grant is a 62 y.o. female seen in consultation per request of Dr. Patsy Lager.  According the patient she has had discomfort in her feet for the last 35 years for which she has seen a podiatrist in the past and also had orthotics.  She uses foot massager off and on.  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  She also had a stand-up desk which helped.  She retired about 2 years ago and had been doing a lot of yard work.  1 year ago she started experiencing increased discomfort in her joints after doing yard work.  She complains of discomfort in her shoulders, elbows, wrist and her hands.  She has occasional discomfort in her feet.  She takes meloxicam on a as needed basis.  She states for the last 1 year she has been having episodic swelling in her wrist, hands and her feet.  She had labs done by Dr. Patsy Lager which showed positive rheumatoid factor and positive anti-CCP in August.  At the time she was given a prednisone taper for 10 days.  Patient noted improvement in her symptoms.  She usually does well on meloxicam.  In October 2024 she went to the beach at the time she was having discomfort in her hips and her wrist and her elbows after playing with her dog.  She was given another prednisone taper lasting for about 10 days starting at 40 mg.  She states the symptoms improved.  Today she has some discomfort in her hips and her feet.  None of the joints are swollen today.  She has been struggling with dry mouth for the last 6 months.  She states she has to constantly drink water.  There is no family history of rheumatoid  arthritis.  He is gravida 2, para 2.  No history of preeclampsia or DVTs.    Activities of Daily Living:  Patient reports morning stiffness for 0 minutes.   Patient Denies nocturnal pain.  Difficulty dressing/grooming: Denies Difficulty climbing stairs: Denies Difficulty getting out of chair: Denies Difficulty using hands for taps, buttons, cutlery, and/or writing: Denies  Review of Systems  Constitutional:  Negative for fatigue.  HENT:  Positive for mouth dryness. Negative for mouth sores.   Eyes:  Negative for dryness.  Respiratory:  Negative for shortness of breath.   Cardiovascular:  Positive for palpitations. Negative for chest pain.  Gastrointestinal:  Negative for blood in stool, constipation and diarrhea.  Endocrine: Negative for increased urination.  Genitourinary:  Negative for involuntary urination.  Musculoskeletal:  Positive for joint pain, joint pain and joint swelling. Negative for gait problem, myalgias, muscle weakness, morning stiffness, muscle tenderness and myalgias.  Skin:  Negative for color change, rash, hair loss and sensitivity to sunlight.  Allergic/Immunologic: Negative for susceptible to infections.  Neurological:  Negative for dizziness and headaches.  Hematological:  Negative for swollen glands.  Psychiatric/Behavioral:  Negative for depressed mood and sleep disturbance. The patient is  nervous/anxious.     PMFS History:  Patient Active Problem List   Diagnosis Date Noted   Colon cancer screening 09/21/2022   Gastroesophageal reflux disease 09/21/2022   Mixed hyperlipidemia 08/23/2021   Elevated blood pressure reading 05/22/2021   Pruritus of vulva 04/21/2021   Insomnia 09/15/2020   Family history of osteoporosis 07/30/2019   Increased body mass index 07/30/2019   Anxiety 07/13/2019   Vitamin D deficiency 07/13/2019   Paroxysmal supraventricular tachycardia (HCC) 09/11/2017   Paroxysmal atrial fibrillation (HCC) 09/11/2017   Asthma in adult,  moderate persistent, uncomplicated 06/13/2017   Osteoporosis without current pathological fracture 12/24/2016   DUB (dysfunctional uterine bleeding) 04/27/2013    Past Medical History:  Diagnosis Date   Asthma    adult onset, confirmed with pre- and post-bronchodialator PFTs 06/13/2017 at PCP.   Menorrhagia 2014   Osteoporosis    Paroxysmal atrial fibrillation (HCC)    Paroxysmal SVT (supraventricular tachycardia) (HCC)     Family History  Problem Relation Age of Onset   Hypertension Mother    Heart disease Mother        stent placement   Hyperlipidemia Mother    Asthma Father    Other Father        motor vehicle accident- age 58   Healthy Brother    Diabetes Maternal Aunt    Heart disease Maternal Grandmother    Healthy Daughter    Healthy Daughter    Past Surgical History:  Procedure Laterality Date   SVT ABLATION N/A 07/04/2020   Procedure: SVT ABLATION;  Surgeon: Marinus Maw, MD;  Location: [facility];  Service: Cardiovascular;  Laterality: N/A;   Social History   Social History Narrative   Married since 1985. Has 2 daughter and 2 granddaughters. Husband is retired Radiation protection practitioner.   Grew up in [place]. Her dad died before she was born. Her stepdad adopted her, had a great childhood. No h/o abuse. Dad was a used Quarry manager. Mom was an Engineer, structural.       Caffeine-none now.   Legal- none   Religious-Christian   Immunization History  Administered Date(s) Administered   Influenza Inj Mdck Quad With Preservative 05/14/2019   Influenza Split 06/27/2020   Influenza,inj,Quad PF,6+ Mos 04/23/2014, 04/10/2016, 05/15/2021   Influenza-Unspecified 04/23/2014, 04/10/2016, 04/13/2021   Moderna Sars-Covid-2 Vaccination 11/09/2019, 12/07/2019   Tdap 10/12/2015   Zoster Recombinant(Shingrix) 01/25/2020, 05/15/2021     Objective: Vital Signs: BP 123/82 (BP Location: Right Arm, Patient Position: Sitting, Cuff Size: Normal)   Pulse 64   Resp 15   Ht 5\' 6"  (1.676 m)    Wt 163 lb 3.2 oz (74 kg)   LMP 07/22/2013   BMI 26.34 kg/m    Physical Exam Vitals and nursing note reviewed.  Constitutional:      Appearance: She is well-developed.  HENT:     Head: Normocephalic and atraumatic.  Eyes:     Conjunctiva/sclera: Conjunctivae normal.  Cardiovascular:     Rate and Rhythm: Normal rate and regular rhythm.     Heart sounds: Normal heart sounds.  Pulmonary:     Effort: Pulmonary effort is normal.     Breath sounds: Normal breath sounds.  Abdominal:     General: Bowel sounds are normal.     Palpations: Abdomen is soft.  Musculoskeletal:     Cervical back: Normal range of motion.  Lymphadenopathy:     Cervical: No cervical adenopathy.  Skin:    General: Skin is warm and dry.  Capillary Refill: Capillary refill takes less than 2 seconds.  Neurological:     Mental Status: She is alert and oriented to person, place, and time.  Psychiatric:        Behavior: Behavior normal.      Musculoskeletal Exam: Cervical, thoracic and lumbar spine 1 good range of motion.  Shoulder joints, elbow joints, wrist joints with good range of motion with no synovitis.  She has mild synovial thickening over bilateral second and third MCP joints.  PIP and DIP prominence with no synovitis was noted.  Hip joints and knee joints were in good range of motion.  No warmth swelling or effusion was noted over knee joints.  Ankles and MTPs were in good range of motion without tenderness.  She had thickening of bilateral first MTP joints.  CDAI Exam: CDAI Score: -- Patient Global: --; Provider Global: -- Swollen: --; Tender: -- Joint Exam 06/11/2023   No joint exam has been documented for this visit   There is currently no information documented on the homunculus. Go to the Rheumatology activity and complete the homunculus joint exam.  Investigation: No additional findings.  Imaging: No results found.  Recent Labs: Lab Results  Component Value Date   WBC 6.2 11/21/2022    HGB 14.7 11/21/2022   PLT 308.0 11/21/2022   NA 138 04/05/2023   K 3.5 04/05/2023   CL 102 04/05/2023   CO2 26 04/05/2023   GLUCOSE 120 (H) 04/05/2023   BUN 17 04/05/2023   CREATININE 1.02 04/05/2023   BILITOT 0.9 11/21/2022   ALKPHOS 80 11/21/2022   AST 15 11/21/2022   ALT 18 11/21/2022   PROT 7.1 11/21/2022   ALBUMIN 4.6 11/21/2022   CALCIUM 9.5 04/05/2023   GFRAA 86 07/01/2020   April 05, 2023 RF 113, anti-CCP 74, CRP <1.0, ESR 26, Lyme negative,  Speciality Comments: No specialty comments available.  Procedures:  No procedures performed Allergies: Patient has no known allergies.   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  She gives episodes of swelling in her wrists, hands and her feet.  Patient had 2 courses of prednisone recently.  The last course was about a week ago.  She notices improvement in her symptoms while she is on prednisone.  She also takes meloxicam in between for pain relief.  Patient has positive rheumatoid factor, positive anti-CCP and history of recurrent swelling.  Detailed counsel regarding rheumatoid arthritis was provided.  Different treatment options and their side effects were discussed.  As patient symptoms appear to be mild we will start her on hydroxychloroquine.  A handout was given and side effects were discussed.  Consent was obtained.  Will start her on hydroxychloroquine 200 mg p.o. twice daily Monday to Friday.  Plan: hydroxychloroquine (PLAQUENIL) 200 MG tablet  Patient was counseled on the purpose, proper use, and adverse effects of hydroxychloroquine including nausea/diarrhea, skin rash, headaches, and sun sensitivity.  Advised patient to wear sunscreen once starting hydroxychloroquine to reduce risk of rash associated with sun  sensitivity.  Discussed importance of annual eye exams while on hydroxychloroquine to monitor to ocular toxicity and discussed importance of frequent laboratory monitoring.  Provided patient with eye exam form for baseline ophthalmologic exam.  Provided patient with educational materials on hydroxychloroquine and answered all questions.  Patient consented  to hydroxychloroquine. Will upload consent in the media tab.    Reviewed risk for QTC prolongation when used in combination with other QTc prolonging agents (including but not limited to antiarrhythmics, macrolide antibiotics, flouroquinolone antibiotics, haloperidol, quetiapine, olanzapine, risperidone, droperidol, ziprasidone, amitriptyline, citalopram, ondansetron, migraine triptans, and methadone).   High risk medication use - Plan: hydroxychloroquine (PLAQUENIL) 200 MG tablet, CBC with Differential/Platelet, COMPLETE METABOLIC PANEL WITH GFR, CK, Hepatitis B core antibody, IgM, Hepatitis B surface antigen, Hepatitis C antibody, QuantiFERON-TB Gold Plus, Serum protein electrophoresis with reflex, IgG, IgA, IgM, Glucose 6 phosphate dehydrogenase.  Immunization information was placed in the AVS.  Chronic pain of both shoulders -she has been complaining of pain and discomfort in the bilateral shoulders.  She had good range of motion of bilateral shoulders without any discomfort.  Plan: XR Shoulder Right, XR Shoulder Left.  X-rays of bilateral shoulder joints were unremarkable.  Pain in both hands -she complains of pain and discomfort in bilateral hands with intermittent swelling.  Synovial thickening was noted over bilateral second and third MCP joints.  Plan: XR Hand 2 View Right, XR Hand 2 View Left.  X-rays of bilateral hands were obtained today generalized osteopenia was noted.  CMC, PIP and DIP thickening consistent with osteoarthritis was noted.  Pain in both feet -she complains of intermittent swelling in her feet.  No synovitis was noted.  She  had bilateral first MTP thickening..  X-rays were consistent with osteoarthritis.  Plan: XR Foot 2 Views Right, XR Foot 2 Views Left  Sicca syndrome (HCC) -she complains of dry mouth.  She states the dry mouth symptoms have been getting worse in the last 6 months.  I will obtain additional labs today.  Plan: ANA, Anti-scleroderma antibody, RNP Antibody, Anti-Smith antibody, Sjogrens syndrome-A extractable nuclear antibody, Sjogrens syndrome-B extractable nuclear antibody, Anti-DNA antibody, double-stranded, C3 and C4  Other fatigue -she has history of fatigue.  Plan: CK  Age-related osteoporosis without current pathological fracture - October 25, 2022 DEXA scan left femoral neck T-score -2.6, BMD 0.670, she is on calcium and vitamin D.  DEXA scan results were reviewed with the patient.  She does not want to start on any treatment at this point.  Vitamin D deficiency-she has history of vitamin D deficiency.  Vitamin D was low at 28.91 in April 2024.  She was advised to take vitamin D 2000 units daily.  Other medical problems are listed as follows:  Paroxysmal supraventricular tachycardia (HCC)  Paroxysmal atrial fibrillation (HCC)  Mixed hyperlipidemia  Gastroesophageal reflux disease without esophagitis  Anxiety  Primary insomnia  Family history of osteoporosis  Increased body mass index  Former smoker - 1PPD x 25 years. She quit smoking 2019.  Orders: Orders Placed This Encounter  Procedures   XR Hand 2 View Right   XR Hand 2 View Left   XR Foot 2 Views Right   XR Foot 2 Views Left   XR Shoulder Right   XR Shoulder Left   CBC with Differential/Platelet   COMPLETE METABOLIC PANEL WITH GFR   ANA   Anti-scleroderma antibody   RNP Antibody   Anti-Smith antibody   Sjogrens syndrome-A extractable nuclear antibody   Sjogrens syndrome-B extractable nuclear antibody   Anti-DNA antibody, double-stranded   C3 and C4   CK   Hepatitis B core antibody, IgM   Hepatitis B surface  antigen   Hepatitis C antibody   QuantiFERON-TB Gold Plus   Serum protein electrophoresis with reflex   IgG, IgA, IgM  Glucose 6 phosphate dehydrogenase   Meds ordered this encounter  Medications   hydroxychloroquine (PLAQUENIL) 200 MG tablet    Sig: Take one tablet by mouth twice daily Mondays through Fridays only. DO NOT TAKE ON SATURDAY AND SUNDAY    Dispense:  120 tablet    Refill:  0    Face-to-face time spent with patient was over 60 minutes. Greater than 50% of time was spent in counseling and coordination of care.  Follow-Up Instructions: Return for Rheumatoid arthritis, Osteoarthritis, Osteoporosis.   Pollyann Savoy, MD  Note - This record has been created using Animal nutritionist.  Chart creation errors have been sought, but may not always  have been located. Such creation errors do not reflect on  the standard of medical care.

## 2023-06-11 ENCOUNTER — Ambulatory Visit (INDEPENDENT_AMBULATORY_CARE_PROVIDER_SITE_OTHER): Payer: BC Managed Care – PPO

## 2023-06-11 ENCOUNTER — Encounter: Payer: Self-pay | Admitting: Rheumatology

## 2023-06-11 ENCOUNTER — Ambulatory Visit: Payer: BC Managed Care – PPO

## 2023-06-11 ENCOUNTER — Ambulatory Visit: Payer: BC Managed Care – PPO | Attending: Rheumatology | Admitting: Rheumatology

## 2023-06-11 VITALS — BP 123/82 | HR 64 | Resp 15 | Ht 66.0 in | Wt 163.2 lb

## 2023-06-11 DIAGNOSIS — M810 Age-related osteoporosis without current pathological fracture: Secondary | ICD-10-CM

## 2023-06-11 DIAGNOSIS — E559 Vitamin D deficiency, unspecified: Secondary | ICD-10-CM

## 2023-06-11 DIAGNOSIS — M79672 Pain in left foot: Secondary | ICD-10-CM | POA: Diagnosis not present

## 2023-06-11 DIAGNOSIS — F419 Anxiety disorder, unspecified: Secondary | ICD-10-CM

## 2023-06-11 DIAGNOSIS — M79671 Pain in right foot: Secondary | ICD-10-CM | POA: Diagnosis not present

## 2023-06-11 DIAGNOSIS — I471 Supraventricular tachycardia, unspecified: Secondary | ICD-10-CM

## 2023-06-11 DIAGNOSIS — M0579 Rheumatoid arthritis with rheumatoid factor of multiple sites without organ or systems involvement: Secondary | ICD-10-CM | POA: Diagnosis not present

## 2023-06-11 DIAGNOSIS — Z87891 Personal history of nicotine dependence: Secondary | ICD-10-CM

## 2023-06-11 DIAGNOSIS — F5101 Primary insomnia: Secondary | ICD-10-CM

## 2023-06-11 DIAGNOSIS — M79642 Pain in left hand: Secondary | ICD-10-CM

## 2023-06-11 DIAGNOSIS — R638 Other symptoms and signs concerning food and fluid intake: Secondary | ICD-10-CM

## 2023-06-11 DIAGNOSIS — M25512 Pain in left shoulder: Secondary | ICD-10-CM

## 2023-06-11 DIAGNOSIS — M25511 Pain in right shoulder: Secondary | ICD-10-CM | POA: Diagnosis not present

## 2023-06-11 DIAGNOSIS — G8929 Other chronic pain: Secondary | ICD-10-CM | POA: Diagnosis not present

## 2023-06-11 DIAGNOSIS — M79641 Pain in right hand: Secondary | ICD-10-CM | POA: Diagnosis not present

## 2023-06-11 DIAGNOSIS — M2550 Pain in unspecified joint: Secondary | ICD-10-CM

## 2023-06-11 DIAGNOSIS — M3500 Sicca syndrome, unspecified: Secondary | ICD-10-CM

## 2023-06-11 DIAGNOSIS — E782 Mixed hyperlipidemia: Secondary | ICD-10-CM

## 2023-06-11 DIAGNOSIS — Z79899 Other long term (current) drug therapy: Secondary | ICD-10-CM | POA: Diagnosis not present

## 2023-06-11 DIAGNOSIS — I480 Paroxysmal atrial fibrillation: Secondary | ICD-10-CM

## 2023-06-11 DIAGNOSIS — K219 Gastro-esophageal reflux disease without esophagitis: Secondary | ICD-10-CM

## 2023-06-11 DIAGNOSIS — Z8262 Family history of osteoporosis: Secondary | ICD-10-CM

## 2023-06-11 DIAGNOSIS — R5383 Other fatigue: Secondary | ICD-10-CM

## 2023-06-11 MED ORDER — HYDROXYCHLOROQUINE SULFATE 200 MG PO TABS
ORAL_TABLET | ORAL | 0 refills | Status: DC
Start: 2023-06-11 — End: 2023-08-26

## 2023-06-11 NOTE — Patient Instructions (Addendum)
Rheumatoid Arthritis Rheumatoid arthritis (RA) is a long-term (chronic) disease that causes inflammation in the joints. RA may start slowly. It most often affects the small joints of the hands and feet. Usually, the same joints are affected on both sides of the body. Inflammation from RA can also affect other parts of the body, including the heart, eyes, or lungs. There is no cure for RA, but medicines can help your symptoms and stop or slow down the progression of the disease. What are the causes? RA is an autoimmune disease. When you have an autoimmune disease, your body's defense system (immune system) mistakenly attacks healthy body tissues. The exact cause of RA is not known. What increases the risk? The following factors may make you more likely to develop this condition: Being female. Having a family history of RA or other autoimmune diseases. Having a history of smoking. Being obese. Having been exposed to pollutants or chemicals. What are the signs or symptoms? Symptoms of this condition usually start gradually. They are often worse in the morning. The first symptom may be morning stiffness that lasts longer than 30 minutes. As RA progresses, symptoms may include: Pain, stiffness, swelling, warmth, and tenderness in joints on both sides of your body. Loss of energy. Loss of appetite. Weight loss. Low-grade fever. Dry eyes and dry mouth. Firm lumps (rheumatoid nodules) that grow beneath your skin in areas such as your forearm bones near your elbows and on your hands. Changes in the appearance of joints (deformity) and loss of joint function. Symptoms of this condition vary from person to person. Symptoms of RA often come and go. Sometimes, symptoms get worse for a period of time. These are called flares. How is this diagnosed? This condition is diagnosed based on your symptoms, medical history, and a physical exam. You may have X-rays or an MRI to check for the type of joint  changes that are caused by RA. You may also have blood tests to look for: Proteins (antibodies) that your immune system may make if you have RA. These include rheumatoid factor (RF) and anti-CCP. When blood tests show these proteins, you are said to have "seropositive RA." When blood tests do not show these proteins, you may have "seronegative RA." Inflammation in your blood. A low number of red blood cells (anemia). How is this treated? The goals of treatment are to relieve pain, reduce inflammation, and slow down or stop joint damage and disability. Treatment may include: Lifestyle changes. It is important to rest as needed, eat a healthy diet, and exercise. Medicines. Your health care provider may adjust your medicines every 3 months until treatment goals are reached. Common medicines include: Pain relievers (analgesics). Corticosteroids and NSAIDs, such as ibuprofen, to reduce inflammation. Disease-modifying antirheumatic drugs (DMARDs) to try to slow the course of the disease. Biologic response modifiers to reduce inflammation and damage. Physical therapy and occupational therapy. Surgery, if you have severe joint damage. Joint replacement or fusing of joints may be needed. Your health care provider will work with you to identify the best treatment option for you based on assessment of the overall disease activity in your body. Follow these instructions at home: Managing pain, stiffness, and swelling If directed, apply heat to the affected area as often as told by your health care provider. Use the heat source that your health care provider recommends, such as a moist heat pack or a heating pad. Place a towel between your skin and the heat source. Leave the heat on for  20-30 minutes. Remove the heat if your skin turns bright red. This is especially important if you are unable to feel pain, heat, or cold. You have a greater risk of getting burned.  Activity Return to your normal  activities as told by your health care provider. Ask your health care provider what activities are safe for you. Rest when you are having a flare. Start an exercise program as told by your health care provider. This may include physical therapy exercises to maintain movement and strength in your joints. General instructions Take over-the-counter and prescription medicines only as told by your health care provider. Keep all follow-up visits. This is important. Where to find more information [employer]: rheumatology.org Arthritis Foundation: arthritis.org Contact a health care provider if: You have a flare-up of RA symptoms. You have a fever. You have side effects from your medicines. Get help right away if: You have chest pain. You have trouble breathing. You quickly develop a hot, painful joint that is more severe than your usual joint aches. These symptoms may be an emergency. Get help right away. Call 911. Do not wait to see if the symptoms will go away. Do not drive yourself to the hospital. Summary Rheumatoid arthritis (RA) is a long-term (chronic) disease that causes inflammation in the joints. RA is an autoimmune disease. The goals of treatment are to relieve pain, reduce inflammation, and slow down or stop joint damage and disability. This information is not intended to replace advice given to you by your health care provider. Make sure you discuss any questions you have with your health care provider. Document Revised: 06/01/2021 Document Reviewed: 06/01/2021 Elsevier Patient Education  2024 Elsevier Inc.  Hydroxychloroquine Tablets What is this medication? HYDROXYCHLOROQUINE (hye drox ee KLOR oh kwin) treats autoimmune conditions, such as rheumatoid arthritis and lupus. It works by slowing down an overactive immune system. It may also be used to prevent and treat malaria. It works by killing the parasite that causes malaria. It belongs to a group of  medications called DMARDs. This medicine may be used for other purposes; ask your health care provider or pharmacist if you have questions. COMMON BRAND NAME(S): Plaquenil, Quineprox, SOVUNA What should I tell my care team before I take this medication? They need to know if you have any of these conditions: Diabetes Eye disease, vision problems Frequently drink alcohol G6PD deficiency Heart disease Irregular heartbeat or rhythm Kidney disease Liver disease Porphyria Psoriasis An unusual or allergic reaction to hydroxychloroquine, other medications, foods, dyes, or preservatives Pregnant or trying to get pregnant Breastfeeding How should I use this medication? Take this medication by mouth with water. Take it as directed on the prescription label. Do not cut, crush, or chew this medication. Swallow the tablets whole. Take it with food. Do not take it more than directed. Take all of this medication unless your care team tells you to stop it early. Keep taking it even if you think you are better. Take products with antacids in them at a different time of day than this medication. Take this medication 4 hours before or 4 hours after antacids. Talk to your care team if you have questions. Talk to your care team about the use of this medication in children. While this medication may be prescribed for selected conditions, precautions do apply. Overdosage: If you think you have taken too much of this medicine contact a poison control center or emergency room at once. NOTE: This medicine is only for you. Do  not share this medicine with others. What if I miss a dose? If you miss a dose, take it as soon as you can. If it is almost time for your next dose, take only that dose. Do not take double or extra doses. What may interact with this medication? Do not take this medication with any of the following: Cisapride Dronedarone Pimozide Thioridazine This medication may also interact with the  following: Ampicillin Antacids Cimetidine Cyclosporine Digoxin Kaolin Medications for diabetes, such as insulin, glipizide, glyburide Medications for seizures, such as carbamazepine, phenobarbital, phenytoin Mefloquine Methotrexate Other medications that cause heart rhythm changes Praziquantel This list may not describe all possible interactions. Give your health care provider a list of all the medicines, herbs, non-prescription drugs, or dietary supplements you use. Also tell them if you smoke, drink alcohol, or use illegal drugs. Some items may interact with your medicine. What should I watch for while using this medication? Visit your care team for regular checks on your progress. Tell your care team if your symptoms do not start to get better or if they get worse. You may need blood work done while you are taking this medication. If you take other medications that can affect heart rhythm, you may need more testing. Talk to your care team if you have questions. Your vision may be tested before and during use of this medication. Tell your care team right away if you have any change in your eyesight. This medication may cause serious skin reactions. They can happen weeks to months after starting the medication. Contact your care team right away if you notice fevers or flu-like symptoms with a rash. The rash may be red or purple and then turn into blisters or peeling of the skin. Or, you might notice a red rash with swelling of the face, lips or lymph nodes in your neck or under your arms. If you or your family notice any changes in your behavior, such as new or worsening depression, thoughts of harming yourself, anxiety, or other unusual or disturbing thoughts, or memory loss, call your care team right away. What side effects may I notice from receiving this medication? Side effects that you should report to your care team as soon as possible: Allergic reactions--skin rash, itching, hives,  swelling of the face, lips, tongue, or throat Aplastic anemia--unusual weakness or fatigue, dizziness, headache, trouble breathing, increased bleeding or bruising Change in vision Heart rhythm changes--fast or irregular heartbeat, dizziness, feeling faint or lightheaded, chest pain, trouble breathing Infection--fever, chills, cough, or sore throat Low blood sugar (hypoglycemia)--tremors or shaking, anxiety, sweating, cold or clammy skin, confusion, dizziness, rapid heartbeat Muscle injury--unusual weakness or fatigue, muscle pain, dark yellow or brown urine, decrease in amount of urine Pain, tingling, or numbness in the hands or feet Rash, fever, and swollen lymph nodes Redness, blistering, peeling, or loosening of the skin, including inside the mouth Thoughts of suicide or self-harm, worsening mood, or feelings of depression Unusual bruising or bleeding Side effects that usually do not require medical attention (report to your care team if they continue or are bothersome): Diarrhea Headache Nausea Stomach pain Vomiting This list may not describe all possible side effects. Call your doctor for medical advice about side effects. You may report side effects to FDA at 1-800-FDA-1088. Where should I keep my medication? Keep out of the reach of children and pets. Store at room temperature up to 30 degrees C (86 degrees F). Protect from light. Get rid of any unused medication after  the expiration date. To get rid of medications that are no longer needed or have expired: Take the medication to a medication take-back program. Check with your pharmacy or law enforcement to find a location. If you cannot return the medication, check the label or package insert to see if the medication should be thrown out in the garbage or flushed down the toilet. If you are not sure, ask your care team. If it is safe to put it in the trash, empty the medication out of the container. Mix the medication with cat litter,  dirt, coffee grounds, or other unwanted substance. Seal the mixture in a bag or container. Put it in the trash. NOTE: This sheet is a summary. It may not cover all possible information. If you have questions about this medicine, talk to your doctor, pharmacist, or health care provider.  2024 Elsevier/Gold Standard (2022-02-05 00:00:00)  Standing Labs We placed an order today for your standing lab work.   Please have your standing labs drawn in 12month and every 3 months  Please have your labs drawn 2 weeks prior to your appointment so that the provider can discuss your lab results at your appointment, if possible.  Please note that you may see your imaging and lab results in MyChart before we have reviewed them. We will contact you once all results are reviewed. Please allow our office up to 72 hours to thoroughly review all of the results before contacting the office for clarification of your results.  WALK-IN LAB HOURS  Monday through Thursday from 8:00 am -12:30 pm and 1:00 pm-5:00 pm and Friday from 8:00 am-12:00 pm.  Patients with office visits requiring labs will be seen before walk-in labs.  You may encounter longer than normal wait times. Please allow additional time. Wait times may be shorter on  Monday and Thursday afternoons.  We do not book appointments for walk-in labs. We appreciate your patience and understanding with our staff.   Labs are drawn by [facility]. Please bring your co-pay at the time of your lab draw.  You may receive a bill from [facility] for your lab work.  Please note if you are on Hydroxychloroquine and and an order has been placed for a Hydroxychloroquine level,  you will need to have it drawn 4 hours or more after your last dose.  If you wish to have your labs drawn at another location, please call the office 24 hours in advance so we can fax the orders.  The office is located at [address], [place], [place] [address]   If you have any questions  regarding directions or hours of operation,  please call [phone].   As a reminder, please drink plenty of water prior to coming for your lab work. Thanks!   Vaccines You are taking a medication(s) that can suppress your immune system.  The following immunizations are recommended: Flu annually Covid-19  RSV Td/Tdap (tetanus, diphtheria, pertussis) every 10 years Pneumonia (Prevnar 15 then Pneumovax 23 at least 1 year apart.  Alternatively, can take Prevnar 20 without needing additional dose) Shingrix: 2 doses from 4 weeks to 6 months apart  Please check with your PCP to make sure you are up to date.   If you have signs or symptoms of an infection or start antibiotics: First, call your PCP for workup of your infection. Hold your medication through the infection, until you complete your antibiotics, and until symptoms resolve if you take the following: Injectable medication (Actemra, Benlysta,  Cimzia, Cosentyx, Enbrel, Humira, Kevzara, Orencia, Remicade, Simponi, Stelara, Taltz, Tremfya) Methotrexate Leflunomide (Arava) Mycophenolate (Cellcept) Harriette Ohara, Olumiant, or Rinvoq   Please take calcium 1200 mg daily between dietary and supplement.  Please take vitamin D 2000 units daily.

## 2023-06-11 NOTE — Progress Notes (Signed)
Pharmacy Note  Subjective: Patient presents today to [facility] for follow up office visit.   Patient seen by the pharmacist for counseling on hydroxychloroquine rheumatoid arthritis.    Prior therapy includes:patient states she was on Plaquenil in the past.  Objective: CMP     Component Value Date/Time   NA 138 04/05/2023 1622   NA 143 07/01/2020 1222   K 3.5 04/05/2023 1622   CL 102 04/05/2023 1622   CO2 26 04/05/2023 1622   GLUCOSE 120 (H) 04/05/2023 1622   BUN 17 04/05/2023 1622   BUN 13 07/01/2020 1222   CREATININE 1.02 04/05/2023 1622   CREATININE 0.86 12/25/2016 0844   CALCIUM 9.5 04/05/2023 1622   PROT 7.1 11/21/2022 0954   ALBUMIN 4.6 11/21/2022 0954   AST 15 11/21/2022 0954   ALT 18 11/21/2022 0954   ALKPHOS 80 11/21/2022 0954   BILITOT 0.9 11/21/2022 0954   GFRNONAA 75 07/01/2020 1222   GFRAA 86 07/01/2020 1222    CBC    Component Value Date/Time   WBC 6.2 11/21/2022 0954   RBC 4.60 11/21/2022 0954   HGB 14.7 11/21/2022 0954   HGB 14.7 07/01/2020 1222   HCT 43.4 11/21/2022 0954   HCT 43.2 07/01/2020 1222   PLT 308.0 11/21/2022 0954   PLT 319 07/01/2020 1222   MCV 94.2 11/21/2022 0954   MCV 94 07/01/2020 1222   MCH 32.0 07/01/2020 1222   MCH 32.1 (A) 07/08/2017 1202   MCH 30.8 12/25/2016 0844   MCHC 34.0 11/21/2022 0954   RDW 12.6 11/21/2022 0954   RDW 11.9 07/01/2020 1222   LYMPHSABS 2.2 07/01/2020 1222   MONOABS 396 12/25/2016 0844   EOSABS 0.1 07/01/2020 1222   BASOSABS 0.1 07/01/2020 1222    Assessment/Plan: Patient was counseled on the purpose, proper use, and adverse effects of hydroxychloroquine including nausea/diarrhea, skin rash, headaches, and sun sensitivity.  Advised patient to wear sunscreen once starting hydroxychloroquine to reduce risk of rash associated with sun sensitivity.  Discussed importance of annual eye exams while on hydroxychloroquine to monitor to ocular toxicity and discussed importance of frequent  laboratory monitoring.  Provided patient with eye exam form for baseline ophthalmologic exam.  Provided patient with educational materials on hydroxychloroquine and answered all questions.  Patient consented to hydroxychloroquine. Will upload consent in the media tab.    Reviewed risk for QTC prolongation when used in combination with other QTc prolonging agents (including but not limited to antiarrhythmics, macrolide antibiotics, flouroquinolone antibiotics, haloperidol, quetiapine, olanzapine, risperidone, droperidol, ziprasidone, amitriptyline, citalopram, ondansetron, migraine triptans, and methadone). EKG shows QTc wnl on July 2024  Dose will be Plaquenil 200 mg twice daily Monday through Friday.  Prescription pending lab results.   Sofie Rower, PharmD, [employer] PGY-1

## 2023-06-12 ENCOUNTER — Ambulatory Visit: Payer: BC Managed Care – PPO | Admitting: Cardiology

## 2023-06-12 NOTE — Addendum Note (Signed)
Addended by: Abbe Amsterdam C on: 06/12/2023 07:45 PM   Modules accepted: Orders

## 2023-06-13 ENCOUNTER — Ambulatory Visit: Payer: BC Managed Care – PPO | Attending: Internal Medicine | Admitting: Internal Medicine

## 2023-06-13 ENCOUNTER — Encounter: Payer: Self-pay | Admitting: Internal Medicine

## 2023-06-13 VITALS — BP 118/80 | HR 70 | Ht 66.0 in | Wt 161.8 lb

## 2023-06-13 DIAGNOSIS — I480 Paroxysmal atrial fibrillation: Secondary | ICD-10-CM | POA: Diagnosis not present

## 2023-06-13 DIAGNOSIS — I471 Supraventricular tachycardia, unspecified: Secondary | ICD-10-CM

## 2023-06-13 NOTE — Progress Notes (Signed)
HPI Alexandra Grant returns today for ongoing evaluation and management of palpitations.  She is a very pleasant 62 year old woman with a history of recurrent and documented symptomatic SVT who underwent EP study and catheter ablation over a year ago.  At that time, we could not induce sustained SVT.  She did have echo beats and double echo beats noted.  She had no inducible atrial tachycardia, atrial flutter, atrial fibrillation, or AV reentrant tachycardia.  A slow pathway modification was carried out successfully.  Over the past few months she has had recurrent palpitations.  No Known Allergies   Current Outpatient Medications  Medication Sig Dispense Refill   albuterol (VENTOLIN HFA) 108 (90 Base) MCG/ACT inhaler Inhale 1-2 puffs into the lungs every 4 (four) hours as needed for wheezing or shortness of breath. 18 g 5   budesonide-formoterol (SYMBICORT) 160-4.5 MCG/ACT inhaler INHALE 2 PUFFS INTO THE LUNGS TWICE A DAY 10.2 each 2   CALCIUM-VITAMIN D PO Take by mouth.     diltiazem (CARDIZEM CD) 240 MG 24 hr capsule TAKE 1 CAPSULE (240 MG TOTAL) BY MOUTH AT BEDTIME. 90 capsule 1   diltiazem (CARDIZEM) 30 MG tablet Take 1 tablet (30 mg total) by mouth 3 (three) times daily as needed. At onset of fast heart rate 90 tablet 3   docusate sodium (COLACE) 100 MG capsule Take 100 mg by mouth at bedtime.     hydroxychloroquine (PLAQUENIL) 200 MG tablet Take one tablet by mouth twice daily Mondays through Fridays only. DO NOT TAKE ON SATURDAY AND SUNDAY 120 tablet 0   meloxicam (MOBIC) 7.5 MG tablet TAKE 1 TABLET BY MOUTH DAILY AS NEEDED FOR PAIN 90 tablet 2   nystatin-triamcinolone (MYCOLOG II) cream Apply externally BID prn for itching. 30 g 4   EQ ASPIRIN ADULT LOW DOSE 81 MG EC tablet Take 1 tablet by mouth once daily (Patient not taking: Reported on 06/11/2023) 90 tablet 2   nystatin (MYCOSTATIN) 100000 UNIT/ML suspension Take 5 mLs (500,000 Units total) by mouth 4 (four) times daily. (Patient  not taking: Reported on 06/11/2023) 60 mL 0   No current facility-administered medications for this visit.     Past Medical History:  Diagnosis Date   Asthma    adult onset, confirmed with pre- and post-bronchodialator PFTs 06/13/2017 at PCP.   Menorrhagia 2014   Osteoporosis    Paroxysmal atrial fibrillation (HCC)    Paroxysmal SVT (supraventricular tachycardia) (HCC)     ROS:   All systems reviewed and negative except as noted in the HPI.   Past Surgical History:  Procedure Laterality Date   SVT ABLATION N/A 07/04/2020   Procedure: SVT ABLATION;  Surgeon: Marinus Maw, MD;  Location: [facility] INVASIVE CV LAB;  Service: Cardiovascular;  Laterality: N/A;     Family History  Problem Relation Age of Onset   Hypertension Mother    Heart disease Mother        stent placement   Hyperlipidemia Mother    Asthma Father    Other Father        motor vehicle accident- age 4   Healthy Brother    Diabetes Maternal Aunt    Heart disease Maternal Grandmother    Healthy Daughter    Healthy Daughter      Social History   Socioeconomic History   Marital status: Married    Spouse name: Not on file   Number of children: Not on file   Years of education: Not on  file   Highest education level: Not on file  Occupational History   Occupation: Diplomatic Services operational officer    Comment: [employer] for 25 years  Tobacco Use   Smoking status: Former    Types: Cigarettes    Passive exposure: Past   Smokeless tobacco: Never   Tobacco comments:    QUIT 2015  Vaping Use   Vaping status: Never Used  Substance and Sexual Activity   Alcohol use: No   Drug use: Not Currently   Sexual activity: Yes    Partners: Male    Birth control/protection: Other-see comments    Comment: vasectomy, INTERCOURSE AGE 17SEXUAL PARTNERS LESS THAN 5  Other Topics Concern   Not on file  Social History Narrative   Married since 1985. Has 2 daughter and 2 granddaughters. Husband is retired Radiation protection practitioner.   Grew up in [place]. Her  dad died before she was born. Her stepdad adopted her, had a great childhood. No h/o abuse. Dad was a used Quarry manager. Mom was an Engineer, structural.       Caffeine-none now.   Legal- none   Religious-Christian   Social Determinants of Health   Financial Resource Strain: Low Risk  (10/28/2020)   Overall Financial Resource Strain (CARDIA)    Difficulty of Paying Living Expenses: Not hard at all  Food Insecurity: No Food Insecurity (10/28/2020)   Hunger Vital Sign    Worried About Running Out of Food in the Last Year: Never true    Ran Out of Food in the Last Year: Never true  Transportation Needs: No Transportation Needs (10/28/2020)   PRAPARE - Administrator, Civil Service (Medical): No    Lack of Transportation (Non-Medical): No  Physical Activity: Inactive (10/28/2020)   Exercise Vital Sign    Days of Exercise per Week: 0 days    Minutes of Exercise per Session: 0 min  Stress: Stress Concern Present (10/28/2020)   [employer] of Occupational Health - Occupational Stress Questionnaire    Feeling of Stress : Rather much  Social Connections: Moderately Integrated (10/28/2020)   Social Connection and Isolation Panel [NHANES]    Frequency of Communication with Friends and Family: More than three times a week    Frequency of Social Gatherings with Friends and Family: More than three times a week    Attends Religious Services: More than 4 times per year    Active Member of [employer] or Organizations: No    Attends Engineer, structural: Never    Marital Status: Married  Catering manager Violence: Not on file     BP 118/80 (BP Location: Left Arm, Patient Position: Sitting, Cuff Size: Normal)   Pulse 70   Ht 5\' 6"  (1.676 m)   Wt 161 lb 12.8 oz (73.4 kg)   LMP 07/22/2013   SpO2 96%   BMI 26.12 kg/m   Physical Exam:  Well appearing NAD HEENT: Unremarkable Neck:  No JVD, no thyromegally Lymphatics:  No adenopathy Back:  No CVA tenderness Lungs:  Clear HEART:   Regular rate rhythm, no murmurs, no rubs, no clicks Abd:  soft, positive bowel sounds, no organomegally, no rebound, no guarding Ext:  2 plus pulses, no edema, no cyanosis, no clubbing Skin:  No rashes no nodules Neuro:  CN II through XII intact, motor grossly intact  EKG  DEVICE  Normal device function.  See PaceArt for details.   Assess/Plan:  1. SVT - she is s/p ablation. No recurrent documented sustained arrhythmias. 2. Probable atrial fib -  I have reviewed strips from 2017 where she had SVT degenerate into atrial fib. I suspect that she is now having atrial fib. I discussed the treatment options. No indication for anti-coag as CHADS VASC is 1. We discussed her wearing another monitor but she would like to try her Apple watch to identify the mechanism of her symptoms. Additional treatment will follow the results of the watch.   Sharlot Gowda Ann Groeneveld,MD

## 2023-06-13 NOTE — Patient Instructions (Signed)

## 2023-06-17 LAB — CBC WITH DIFFERENTIAL/PLATELET
Absolute Lymphocytes: 1363 {cells}/uL (ref 850–3900)
Absolute Monocytes: 826 {cells}/uL (ref 200–950)
Basophils Absolute: 38 {cells}/uL (ref 0–200)
Basophils Relative: 0.4 %
Eosinophils Absolute: 173 {cells}/uL (ref 15–500)
Eosinophils Relative: 1.8 %
HCT: 44.8 % (ref 35.0–45.0)
Hemoglobin: 14.8 g/dL (ref 11.7–15.5)
MCH: 31.6 pg (ref 27.0–33.0)
MCHC: 33 g/dL (ref 32.0–36.0)
MCV: 95.5 fL (ref 80.0–100.0)
MPV: 9.6 fL (ref 7.5–12.5)
Monocytes Relative: 8.6 %
Neutro Abs: 7200 {cells}/uL (ref 1500–7800)
Neutrophils Relative %: 75 %
Platelets: 291 10*3/uL (ref 140–400)
RBC: 4.69 10*6/uL (ref 3.80–5.10)
RDW: 12 % (ref 11.0–15.0)
Total Lymphocyte: 14.2 %
WBC: 9.6 10*3/uL (ref 3.8–10.8)

## 2023-06-17 LAB — COMPLETE METABOLIC PANEL WITH GFR
AG Ratio: 2 (calc) (ref 1.0–2.5)
ALT: 18 U/L (ref 6–29)
AST: 13 U/L (ref 10–35)
Albumin: 4.3 g/dL (ref 3.6–5.1)
Alkaline phosphatase (APISO): 67 U/L (ref 37–153)
BUN: 19 mg/dL (ref 7–25)
CO2: 24 mmol/L (ref 20–32)
Calcium: 9.6 mg/dL (ref 8.6–10.4)
Chloride: 105 mmol/L (ref 98–110)
Creat: 0.88 mg/dL (ref 0.50–1.05)
Globulin: 2.2 g/dL (ref 1.9–3.7)
Glucose, Bld: 88 mg/dL (ref 65–99)
Potassium: 4.2 mmol/L (ref 3.5–5.3)
Sodium: 140 mmol/L (ref 135–146)
Total Bilirubin: 0.7 mg/dL (ref 0.2–1.2)
Total Protein: 6.5 g/dL (ref 6.1–8.1)
eGFR: 75 mL/min/{1.73_m2} (ref >=60)

## 2023-06-17 LAB — PROTEIN ELECTROPHORESIS, SERUM, WITH REFLEX
Albumin ELP: 4.1 g/dL (ref 3.8–4.8)
Alpha 1: 0.3 g/dL (ref 0.2–0.3)
Alpha 2: 0.7 g/dL (ref 0.5–0.9)
Beta 2: 0.4 g/dL (ref 0.2–0.5)
Beta Globulin: 0.4 g/dL (ref 0.4–0.6)
Gamma Globulin: 0.7 g/dL — ABNORMAL LOW (ref 0.8–1.7)
Total Protein: 6.6 g/dL (ref 6.1–8.1)

## 2023-06-17 LAB — ANTI-DNA ANTIBODY, DOUBLE-STRANDED: ds DNA Ab: <1 [IU]/mL

## 2023-06-17 LAB — QUANTIFERON-TB GOLD PLUS
Mitogen-NIL: 4.85 [IU]/mL
NIL: 0.02 [IU]/mL
QuantiFERON-TB Gold Plus: NEGATIVE
TB1-NIL: <0 [IU]/mL
TB2-NIL: 0 [IU]/mL

## 2023-06-17 LAB — SJOGRENS SYNDROME-A EXTRACTABLE NUCLEAR ANTIBODY: SSA (Ro) (ENA) Antibody, IgG: <1 AI

## 2023-06-17 LAB — RNP ANTIBODY: Ribonucleic Protein(ENA) Antibody, IgG: <1 AI

## 2023-06-17 LAB — HEPATITIS C ANTIBODY: Hepatitis C Ab: NONREACTIVE

## 2023-06-17 LAB — GLUCOSE 6 PHOSPHATE DEHYDROGENASE: G-6PDH: 15.2 U/g{Hb} (ref 7.0–20.5)

## 2023-06-17 LAB — HEPATITIS B SURFACE ANTIGEN: Hepatitis B Surface Ag: NONREACTIVE

## 2023-06-17 LAB — HEPATITIS B CORE ANTIBODY, IGM: Hep B C IgM: NONREACTIVE

## 2023-06-17 LAB — ANA: Anti Nuclear Antibody (ANA): NEGATIVE

## 2023-06-17 LAB — C3 AND C4
C3 Complement: 150 mg/dL (ref 83–193)
C4 Complement: 26 mg/dL (ref 15–57)

## 2023-06-17 LAB — IFE INTERPRETATION

## 2023-06-17 LAB — IGG, IGA, IGM
IgG (Immunoglobin G), Serum: 759 mg/dL (ref 600–1540)
IgM, Serum: 82 mg/dL (ref 50–300)
Immunoglobulin A: 266 mg/dL (ref 70–320)

## 2023-06-17 LAB — CK: Total CK: 66 U/L (ref 29–143)

## 2023-06-17 LAB — ANTI-SMITH ANTIBODY: ENA SM Ab Ser-aCnc: <1 AI

## 2023-06-17 LAB — SJOGRENS SYNDROME-B EXTRACTABLE NUCLEAR ANTIBODY: SSB (La) (ENA) Antibody, IgG: <1 AI

## 2023-06-17 LAB — ANTI-SCLERODERMA ANTIBODY: Scleroderma (Scl-70) (ENA) Antibody, IgG: <1 AI

## 2023-06-17 NOTE — Progress Notes (Signed)
All the labs are within normal limits.  I will discuss results at the follow-up visit.

## 2023-06-18 ENCOUNTER — Other Ambulatory Visit: Payer: Self-pay | Admitting: Family Medicine

## 2023-06-18 DIAGNOSIS — J4530 Mild persistent asthma, uncomplicated: Secondary | ICD-10-CM

## 2023-06-19 LAB — HM MAMMOGRAPHY

## 2023-06-20 ENCOUNTER — Encounter: Payer: Self-pay | Admitting: Family Medicine

## 2023-06-24 ENCOUNTER — Ambulatory Visit (HOSPITAL_BASED_OUTPATIENT_CLINIC_OR_DEPARTMENT_OTHER)
Admission: RE | Admit: 2023-06-24 | Discharge: 2023-06-24 | Disposition: A | Discharge status: Home / Self Care | Payer: BC Managed Care – PPO | Source: Ambulatory Visit | Attending: Family Medicine | Admitting: Family Medicine

## 2023-06-24 ENCOUNTER — Encounter: Payer: Self-pay | Admitting: Internal Medicine

## 2023-06-24 DIAGNOSIS — Z122 Encounter for screening for malignant neoplasm of respiratory organs: Secondary | ICD-10-CM | POA: Diagnosis present

## 2023-06-25 ENCOUNTER — Ambulatory Visit: Payer: BC Managed Care – PPO | Attending: Internal Medicine

## 2023-06-25 DIAGNOSIS — E782 Mixed hyperlipidemia: Secondary | ICD-10-CM

## 2023-06-25 DIAGNOSIS — I471 Supraventricular tachycardia, unspecified: Secondary | ICD-10-CM

## 2023-06-25 DIAGNOSIS — I480 Paroxysmal atrial fibrillation: Secondary | ICD-10-CM

## 2023-06-26 LAB — LIPOPROTEIN A (LPA): Lipoprotein (a): 19.1 nmol/L (ref <75.0)

## 2023-06-26 LAB — LIPID PANEL
Chol/HDL Ratio: 3 ratio (ref 0.0–4.4)
Cholesterol, Total: 201 mg/dL — ABNORMAL HIGH (ref 100–199)
HDL: 67 mg/dL (ref >39)
LDL Chol Calc (NIH): 119 mg/dL — ABNORMAL HIGH (ref 0–99)
Triglycerides: 82 mg/dL (ref 0–149)
VLDL Cholesterol Cal: 15 mg/dL (ref 5–40)

## 2023-07-18 NOTE — Progress Notes (Signed)
Office Visit Note  Patient: Alexandra Grant             Date of Birth: 1961/05/25           MRN: [id number]             PCP: Pearline Cables, MD Referring: Pearline Cables, MD Visit Date: 08/01/2023 Occupation: @GUAROCC @  Subjective:  Pain in hands  History of Present Illness: Alexandra Grant is a 62 y.o. female with seropositive rheumatoid arthritis.  She was started on hydroxychloroquine on June 11, 2023.  She has been taking hydroxychloroquine 1 tablet twice a day Monday to Friday.  She finished prednisone taper yesterday.  She states since her last visit she has had some discomfort in her shoulders.  She also had an episode 2 weeks ago after using a vacuum cleaner she developed pain and swelling in her right hand to the point she was having difficulty making a fist.  She states the symptoms gradually improved.  None of the other joints are painful.  She continues to have dry mouth symptoms.  She was accompanied by her husband today who is a retired Engineer, civil (consulting).    Activities of Daily Living:  Patient reports morning stiffness for 0 minutes.   Patient Reports nocturnal pain.  Difficulty dressing/grooming: Reports Difficulty climbing stairs: Denies Difficulty getting out of chair: Denies Difficulty using hands for taps, buttons, cutlery, and/or writing: Reports  Review of Systems  Constitutional:  Negative for fatigue.  HENT:  Positive for mouth dryness. Negative for mouth sores.   Eyes:  Negative for dryness.  Respiratory:  Negative for shortness of breath.   Cardiovascular:  Negative for chest pain and palpitations.  Gastrointestinal:  Negative for blood in stool, constipation and diarrhea.  Endocrine: Negative for increased urination.  Genitourinary:  Negative for involuntary urination.  Musculoskeletal:  Positive for joint pain, joint pain, joint swelling and muscle weakness. Negative for gait problem, myalgias, morning stiffness, muscle tenderness and myalgias.  Skin:   Negative for color change, rash, hair loss and sensitivity to sunlight.  Allergic/Immunologic: Negative for susceptible to infections.  Neurological:  Positive for numbness. Negative for dizziness and headaches.  Hematological:  Negative for swollen glands.  Psychiatric/Behavioral:  Negative for depressed mood and sleep disturbance. The patient is not nervous/anxious.     PMFS History:  Patient Active Problem List   Diagnosis Date Noted   Colon cancer screening 09/21/2022   Gastroesophageal reflux disease 09/21/2022   Mixed hyperlipidemia 08/23/2021   Elevated blood pressure reading 05/22/2021   Pruritus of vulva 04/21/2021   Insomnia 09/15/2020   Family history of osteoporosis 07/30/2019   Increased body mass index 07/30/2019   Anxiety 07/13/2019   Vitamin D deficiency 07/13/2019   Paroxysmal supraventricular tachycardia (HCC) 09/11/2017   Paroxysmal atrial fibrillation (HCC) 09/11/2017   Asthma in adult, moderate persistent, uncomplicated 06/13/2017   Osteoporosis without current pathological fracture 12/24/2016   DUB (dysfunctional uterine bleeding) 04/27/2013    Past Medical History:  Diagnosis Date   Asthma    adult onset, confirmed with pre- and post-bronchodialator PFTs 06/13/2017 at PCP.   Menorrhagia 2014   Osteoporosis    Paroxysmal atrial fibrillation (HCC)    Paroxysmal SVT (supraventricular tachycardia) (HCC)     Family History  Problem Relation Age of Onset   Hypertension Mother    Heart disease Mother        stent placement   Hyperlipidemia Mother    Asthma Father    Other  Father        motor vehicle accident- age 59   Healthy Brother    Diabetes Maternal Aunt    Heart disease Maternal Grandmother    Healthy Daughter    Healthy Daughter    Past Surgical History:  Procedure Laterality Date   SVT ABLATION N/A 07/04/2020   Procedure: SVT ABLATION;  Surgeon: Marinus Maw, MD;  Location: [facility] INVASIVE CV LAB;  Service: Cardiovascular;  Laterality: N/A;    Social History   Social History Narrative   Married since 1985. Has 2 daughter and 2 granddaughters. Husband is retired Radiation protection practitioner.   Grew up in [place]. Her dad died before she was born. Her stepdad adopted her, had a great childhood. No h/o abuse. Dad was a used Quarry manager. Mom was an Engineer, structural.       Caffeine-none now.   Legal- none   Religious-Christian   Immunization History  Administered Date(s) Administered   Influenza Inj Mdck Quad With Preservative 05/14/2019   Influenza Split 06/27/2020   Influenza,inj,Quad PF,6+ Mos 04/23/2014, 04/10/2016, 05/15/2021   Influenza-Unspecified 04/23/2014, 04/10/2016, 04/13/2021   Moderna Sars-Covid-2 Vaccination 11/09/2019, 12/07/2019   Tdap 10/12/2015   Zoster Recombinant(Shingrix) 01/25/2020, 05/15/2021     Objective: Vital Signs: BP 125/84 (BP Location: Left Arm, Patient Position: Sitting, Cuff Size: Normal)   Pulse 66   Resp 13   Ht 5\' 6"  (1.676 m)   Wt 160 lb 9.6 oz (72.8 kg)   LMP 07/22/2013   BMI 25.92 kg/m    Physical Exam Vitals and nursing note reviewed.  Constitutional:      Appearance: She is well-developed.  HENT:     Head: Normocephalic and atraumatic.  Eyes:     Conjunctiva/sclera: Conjunctivae normal.  Cardiovascular:     Rate and Rhythm: Normal rate and regular rhythm.     Heart sounds: Normal heart sounds.  Pulmonary:     Effort: Pulmonary effort is normal.     Breath sounds: Normal breath sounds.  Abdominal:     General: Bowel sounds are normal.     Palpations: Abdomen is soft.  Musculoskeletal:     Cervical back: Normal range of motion.  Lymphadenopathy:     Cervical: No cervical adenopathy.  Skin:    General: Skin is warm and dry.     Capillary Refill: Capillary refill takes less than 2 seconds.  Neurological:     Mental Status: She is alert and oriented to person, place, and time.  Psychiatric:        Behavior: Behavior normal.     Musculoskeletal Exam: Cervical, thoracic and lumbar  spine were in good range of motion.  Shoulders, elbow joints, wrist joints, MCPs PIPs and DIPs with good range of motion.  She had mild tenderness over right wrist joint over the flexor tendons.  No synovitis or tenosynovitis was noted.  No synovitis was noted over MCP joints.  PIP and DIP thickening was noted.  Hip joints and knee joints with good range of motion without any warmth swelling or effusion.  There was no tenderness over ankles or MTPs.  CDAI Exam: CDAI Score: -- Patient Global: --; Provider Global: -- Swollen: --; Tender: -- Joint Exam 08/01/2023   No joint exam has been documented for this visit   There is currently no information documented on the homunculus. Go to the Rheumatology activity and complete the homunculus joint exam.  Investigation: No additional findings.  Imaging: No results found.  Recent Labs: Lab Results  Component  Value Date   WBC 9.6 06/11/2023   HGB 14.8 06/11/2023   PLT 291 06/11/2023   NA 140 06/11/2023   K 4.2 06/11/2023   CL 105 06/11/2023   CO2 24 06/11/2023   GLUCOSE 88 06/11/2023   BUN 19 06/11/2023   CREATININE 0.88 06/11/2023   BILITOT 0.7 06/11/2023   ALKPHOS 80 11/21/2022   AST 13 06/11/2023   ALT 18 06/11/2023   PROT 6.5 06/11/2023   PROT 6.6 06/11/2023   ALBUMIN 4.6 11/21/2022   CALCIUM 9.6 06/11/2023   GFRAA 86 07/01/2020   QFTBGOLDPLUS NEGATIVE 06/11/2023   June 11, 2023 IFE normal pattern, immunoglobulins normal, TB Gold negative, hepatitis B nonreactive, hepatitis C nonreactive, G6PD normal, CK 66, ANA negative, ENA (SCL 70, RNP, Smith, SSA, SSB, dsDNA) negative, C3-C4 normal  Speciality Comments: Plaquenil June 11, 2023  Procedures:  No procedures performed Allergies: Patient has no known allergies.   Assessment / Plan:     Visit Diagnoses: Rheumatoid arthritis with rheumatoid factor of multiple sites without organ or systems involvement (HCC) - + RF, + anti-CCP, - ANA: Patient had 2 tapers of prednisone  and was started on Plaquenil at the last visit on June 11, 2023.  Patient states she was doing well on the prednisone taper and Plaquenil combination.  She states about 2 weeks ago she vacuumed her house and developed pain and swelling in her right hand which she describes over the flexor aspect of her right wrist.  She states she was having difficulty making a fist.  And gradually the symptoms improved on meloxicam.  She had been taking hydroxychloroquine 200 mg p.o. twice daily Monday to Friday without any interruption since the last visit.  She denies any side effects of Plaquenil.  She finished prednisone yesterday.  I did detailed discussion with the patient.  We discussed if the Plaquenil is not effective enough then we can try a methotrexate in the future.  She was accompanied by her husband who is a retired Engineer, civil (consulting).  He is aware of the methotrexate and its side effects.  Information regarding methotrexate was given if needed in the future.  She will contact us if her symptoms get worse.  Otherwise she will be return for a follow-up visit in 3 months.  High risk medication use - Plaquenil 200 mg p.o. twice daily Monday to Friday -will check labs today, 3 months and then every 5 months.  Patient plans to get a baseline eye examination in January.  Annual eye examination was advised.  Plan: CBC with Differential/Platelet, COMPLETE METABOLIC PANEL WITH GFR today.  Chronic pain of both shoulders -she was experiencing a lot of discomfort in her shoulders in the past.  She had good range of motion of bilateral shoulders without discomfort.  Xrays of bilateral shoulders obtained at the last visit were unremarkable.  X-rays also discussed with the patient.  Pain in both hands -patient had recent flare of arthritis in her right hand which she describes over the right wrist flexor tendons.  She had mild tenderness over the flexor tendon region.  The symptoms started after vacuumed her house.  The symptoms  resolved after taking my meloxicam.  She gives history of intermittent swelling in the past.  X-rays obtained at the last visit showed generalized osteopenia and osteoarthritic changes.  X-ray findings were discussed with the patient and her husband.  Pain in both feet -she gives history of intermittent swelling.  She has not had any swelling since the last  visit.  X-ray showed osteoarthritic changes.  Sicca syndrome (HCC) - Dry mouth.  SSA negative, SSB negative.  Labs are discussed with the patient.  Over-the-counter products for dry mouth were discussed.  Patient declined pilocarpine.  Age-related osteoporosis without current pathological fracture - October 25, 2022 DEXA scan left femoral neck T-score -2.6, BMD 0.670, she is on calcium and vitamin D.  Patient declined treatment at the last visit and at this visit.  I advised her to review information on Fosamax.  Vitamin D deficiency - Vitamin D 28.94 in April 2024.  She is on vitamin D 2000 units daily.   Other medical problems are listed as follows:  Paroxysmal atrial fibrillation (HCC)  Paroxysmal supraventricular tachycardia (HCC)  Mixed hyperlipidemia  Gastroesophageal reflux disease without esophagitis  Primary insomnia  Anxiety  Increased body mass index  Former smoker  Family history of osteoporosis  Orders: Orders Placed This Encounter  Procedures   CBC with Differential/Platelet   COMPLETE METABOLIC PANEL WITH GFR   No orders of the defined types were placed in this encounter.  Face-to-face time spent patient was 40 minutes.  Greater than 50% time was spent in counseling and coordination of care.  Follow-Up Instructions: Return in about 2 months (around 10/02/2023) for Rheumatoid arthritis, Osteoarthritis.   Pollyann Savoy, MD  Note - This record has been created using Animal nutritionist.  Chart creation errors have been sought, but may not always  have been located. Such creation errors do not reflect on   the standard of medical care.

## 2023-07-19 ENCOUNTER — Encounter: Payer: Self-pay | Admitting: Family Medicine

## 2023-07-20 ENCOUNTER — Encounter: Payer: Self-pay | Admitting: Rheumatology

## 2023-07-22 NOTE — Telephone Encounter (Signed)
It could be rheumatoid arthritis flare.  Patient is an appointment coming up on the 19th.  Please give a short taper of prednisone starting at 20 mg and taper by 5 mg every 2 days.  Side effects of prednisone include elevated blood pressure, elevated blood sugar, increased risk of heart disease, high cholesterol cataracts and osteoporosis.  Will discuss further treatment options at the follow-up visit if the symptoms persist.

## 2023-07-23 MED ORDER — PREDNISONE 5 MG PO TABS: ORAL_TABLET | ORAL | 0 refills | Status: DC

## 2023-08-01 ENCOUNTER — Encounter: Payer: Self-pay | Admitting: Rheumatology

## 2023-08-01 ENCOUNTER — Ambulatory Visit: Payer: BC Managed Care – PPO | Attending: Rheumatology | Admitting: Rheumatology

## 2023-08-01 VITALS — BP 125/84 | HR 66 | Resp 13 | Ht 66.0 in | Wt 160.6 lb

## 2023-08-01 DIAGNOSIS — M25512 Pain in left shoulder: Secondary | ICD-10-CM

## 2023-08-01 DIAGNOSIS — M79642 Pain in left hand: Secondary | ICD-10-CM

## 2023-08-01 DIAGNOSIS — M0579 Rheumatoid arthritis with rheumatoid factor of multiple sites without organ or systems involvement: Secondary | ICD-10-CM

## 2023-08-01 DIAGNOSIS — M79641 Pain in right hand: Secondary | ICD-10-CM

## 2023-08-01 DIAGNOSIS — Z79899 Other long term (current) drug therapy: Secondary | ICD-10-CM

## 2023-08-01 DIAGNOSIS — Z87891 Personal history of nicotine dependence: Secondary | ICD-10-CM

## 2023-08-01 DIAGNOSIS — F5101 Primary insomnia: Secondary | ICD-10-CM

## 2023-08-01 DIAGNOSIS — K219 Gastro-esophageal reflux disease without esophagitis: Secondary | ICD-10-CM

## 2023-08-01 DIAGNOSIS — M79672 Pain in left foot: Secondary | ICD-10-CM

## 2023-08-01 DIAGNOSIS — M3500 Sicca syndrome, unspecified: Secondary | ICD-10-CM

## 2023-08-01 DIAGNOSIS — Z8262 Family history of osteoporosis: Secondary | ICD-10-CM

## 2023-08-01 DIAGNOSIS — M25511 Pain in right shoulder: Secondary | ICD-10-CM

## 2023-08-01 DIAGNOSIS — I471 Supraventricular tachycardia, unspecified: Secondary | ICD-10-CM

## 2023-08-01 DIAGNOSIS — M810 Age-related osteoporosis without current pathological fracture: Secondary | ICD-10-CM

## 2023-08-01 DIAGNOSIS — F419 Anxiety disorder, unspecified: Secondary | ICD-10-CM

## 2023-08-01 DIAGNOSIS — I480 Paroxysmal atrial fibrillation: Secondary | ICD-10-CM

## 2023-08-01 DIAGNOSIS — E559 Vitamin D deficiency, unspecified: Secondary | ICD-10-CM

## 2023-08-01 DIAGNOSIS — R638 Other symptoms and signs concerning food and fluid intake: Secondary | ICD-10-CM

## 2023-08-01 DIAGNOSIS — G8929 Other chronic pain: Secondary | ICD-10-CM

## 2023-08-01 DIAGNOSIS — M79671 Pain in right foot: Secondary | ICD-10-CM

## 2023-08-01 DIAGNOSIS — E782 Mixed hyperlipidemia: Secondary | ICD-10-CM

## 2023-08-01 NOTE — Patient Instructions (Signed)
Methotrexate Tablets What is this medication? METHOTREXATE (METH oh TREX ate) treats autoimmune conditions, such as arthritis and psoriasis. It works by decreasing inflammation, which can reduce pain and prevent long-term injury to the joints and skin. It may also be used to treat some types of cancer. It works by slowing down the growth of cancer cells. This medicine may be used for other purposes; ask your health care provider or pharmacist if you have questions. COMMON BRAND NAME(S): Rheumatrex, Trexall What should I tell my care team before I take this medication? They need to know if you have any of these conditions: Dehydration Diabetes Fluid in the stomach area or lungs Frequently drink alcohol Having surgery, including dental surgery High cholesterol Immune system problems Inflammatory bowel disease, such as ulcerative colitis Kidney disease Liver disease Low blood cell levels (white cells, red cells, and platelets) Lung disease Recent or ongoing radiation Recent or upcoming vaccine Stomach ulcers, other stomach or intestine problems An unusual or allergic reaction to methotrexate, other medications, foods, dyes, or preservatives Pregnant or trying to get pregnant Breastfeeding How should I use this medication? Take this medication by mouth with water. Take it as directed on the prescription label. Do not take extra. Keep taking this medication until your care team tells you to stop. Know why you are taking this medication and how you should take it. To treat conditions such as arthritis and psoriasis, this medication is taken ONCE A WEEK as a single dose or divided into 3 smaller doses taken 12 hours apart (do not take more than 3 doses 12 hours apart each week). This medication is NEVER taken daily to treat conditions other than cancer. Taking this medication more often than directed can cause serious side effects, even death. Talk to your care team about why you are taking this  medication, how often you will take it, and what your dose is. Ask your care team to put the reason you take this medication on the prescription. If you take this medication ONCE A WEEK, choose a day of the week before you start. Ask your pharmacist to include the day of the week on the label. Avoid "Monday", which could be misread as "Morning". Handling this medication may be harmful. Talk to your care team about how to handle this medication. Special instructions may apply. Talk to your care team about the use of this medication in children. While it may be prescribed for selected conditions, precautions do apply. Overdosage: If you think you have taken too much of this medicine contact a poison control center or emergency room at once. NOTE: This medicine is only for you. Do not share this medicine with others. What if I miss a dose? If you miss a dose, talk with your care team. Do not take double or extra doses. What may interact with this medication? Do not take this medication with any of the following: Acitretin Live virus vaccines Probenecid This medication may also interact with the following: Alcohol Aspirin and aspirin-like medications Certain antibiotics, such as penicillin, neomycin, sulfamethoxazole; trimethoprim Certain medications for stomach problems, such as lansoprazole, omeprazole, pantoprazole Clozapine Cyclosporine Dapsone Folic acid Foscarnet NSAIDs, medications for pain and inflammation, such as ibuprofen or naproxen Phenytoin Pyrimethamine Steroid medications, such as prednisone or cortisone Tacrolimus Theophylline This list may not describe all possible interactions. Give your health care provider a list of all the medicines, herbs, non-prescription drugs, or dietary supplements you use. Also tell them if you smoke, drink alcohol, or use  illegal drugs. Some items may interact with your medicine. What should I watch for while using this medication? Visit your  care team for regular checks on your progress. It may be some time before you see the benefit from this medication. You may need blood work done while you are taking this medication. If your care team has also prescribed folic acid, they may instruct you to skip your folic acid dose on the day you take methotrexate. This medication can make you more sensitive to the sun. Keep out of the sun. If you cannot avoid being in the sun, wear protective clothing and sunscreen. Do not use sun lamps, tanning beds, or tanning booths. Check with your care team if you have severe diarrhea, nausea, and vomiting, or if you sweat a lot. The loss of too much body fluid may make it dangerous for you to take this medication. This medication may increase your risk of getting an infection. Call your care team for advice if you get a fever, chills, sore throat, or other symptoms of a cold or flu. Do not treat yourself. Try to avoid being around people who are sick. Talk to your care team about your risk of cancer. You may be more at risk for certain types of cancers if you take this medication. Talk to your care team if you or your partner may be pregnant. Serious birth defects can occur if you take this medication during pregnancy and for 6 months after the last dose. You will need a negative pregnancy test before starting this medication. Contraception is recommended while taking this medication and for 6 months after the last dose. Your care team can help you find the option that works for you. If your partner can get pregnant, use a condom during sex while taking this medication and for 3 months after the last dose. Do not breastfeed while taking this medication and for 1 week after the last dose. This medication may cause infertility. Talk to your care team if you are concerned about your fertility. What side effects may I notice from receiving this medication? Side effects that you should report to your care team as soon  as possible: Allergic reactions--skin rash, itching, hives, swelling of the face, lips, tongue, or throat Dry cough, shortness of breath or trouble breathing Infection--fever, chills, cough, sore throat, wounds that don't heal, pain or trouble when passing urine, general feeling of discomfort or being unwell Kidney injury--decrease in the amount of urine, swelling of the ankles, hands, or feet Liver injury--right upper belly pain, loss of appetite, nausea, light-colored stool, dark yellow or brown urine, yellowing skin or eyes, unusual weakness or fatigue Low red blood cell level--unusual weakness or fatigue, dizziness, headache, trouble breathing Pain, tingling, or numbness in the hands or feet, muscle weakness, change in vision, confusion or trouble speaking, loss of balance or coordination, trouble walking, seizures Redness, blistering, peeling, or loosening of the skin, including inside the mouth Stomach bleeding--bloody or black, tar-like stools, vomiting blood or brown material that looks like coffee grounds Stomach pain that is severe, does not away, or gets worse Unusual bruising or bleeding Side effects that usually do not require medical attention (report these to your care team if they continue or are bothersome): Diarrhea Dizziness Hair loss Nausea Pain, redness, or swelling with sores inside the mouth or throat Skin reactions on sun-exposed areas Vomiting This list may not describe all possible side effects. Call your doctor for medical advice about side effects. You  may report side effects to FDA at 1-800-FDA-1088. Where should I keep my medication? Keep out of the reach of children and pets. Store at room temperature between 20 and 25 degrees C (68 and 77 degrees F). Protect from light. Keep the container tightly closed. Get rid of any unused medication after the expiration date. To get rid of medications that are no longer needed or have expired: Take the medication to a  medication take-back program. Check with your pharmacy or law enforcement to find a location. If you cannot return the medication, ask your pharmacist or care team how to get rid of this medication safely. NOTE: This sheet is a summary. It may not cover all possible information. If you have questions about this medicine, talk to your doctor, pharmacist, or health care provider.  2024 Elsevier/Gold Standard (2022-08-21 00:00:00)

## 2023-08-02 LAB — CBC WITH DIFFERENTIAL/PLATELET
Absolute Lymphocytes: 1879 {cells}/uL (ref 850–3900)
Absolute Monocytes: 739 {cells}/uL (ref 200–950)
Basophils Absolute: 39 {cells}/uL (ref 0–200)
Basophils Relative: 0.5 %
Eosinophils Absolute: 139 {cells}/uL (ref 15–500)
Eosinophils Relative: 1.8 %
HCT: 45.6 % — ABNORMAL HIGH (ref 35.0–45.0)
Hemoglobin: 14.9 g/dL (ref 11.7–15.5)
MCH: 30.7 pg (ref 27.0–33.0)
MCHC: 32.7 g/dL (ref 32.0–36.0)
MCV: 93.8 fL (ref 80.0–100.0)
MPV: 10 fL (ref 7.5–12.5)
Monocytes Relative: 9.6 %
Neutro Abs: 4905 {cells}/uL (ref 1500–7800)
Neutrophils Relative %: 63.7 %
Platelets: 391 10*3/uL (ref 140–400)
RBC: 4.86 10*6/uL (ref 3.80–5.10)
RDW: 11.5 % (ref 11.0–15.0)
Total Lymphocyte: 24.4 %
WBC: 7.7 10*3/uL (ref 3.8–10.8)

## 2023-08-02 LAB — COMPLETE METABOLIC PANEL WITH GFR
AG Ratio: 1.9 (calc) (ref 1.0–2.5)
ALT: 13 U/L (ref 6–29)
AST: 14 U/L (ref 10–35)
Albumin: 4.5 g/dL (ref 3.6–5.1)
Alkaline phosphatase (APISO): 70 U/L (ref 37–153)
BUN: 16 mg/dL (ref 7–25)
CO2: 29 mmol/L (ref 20–32)
Calcium: 10.2 mg/dL (ref 8.6–10.4)
Chloride: 102 mmol/L (ref 98–110)
Creat: 0.88 mg/dL (ref 0.50–1.05)
Globulin: 2.4 g/dL (ref 1.9–3.7)
Glucose, Bld: 91 mg/dL (ref 65–99)
Potassium: 4.6 mmol/L (ref 3.5–5.3)
Sodium: 139 mmol/L (ref 135–146)
Total Bilirubin: 0.6 mg/dL (ref 0.2–1.2)
Total Protein: 6.9 g/dL (ref 6.1–8.1)
eGFR: 75 mL/min/{1.73_m2} (ref >=60)

## 2023-08-02 NOTE — Progress Notes (Signed)
CBC and CMP are normal.

## 2023-08-17 ENCOUNTER — Encounter: Payer: Self-pay | Admitting: Family Medicine

## 2023-08-19 ENCOUNTER — Other Ambulatory Visit: Payer: Self-pay

## 2023-08-19 ENCOUNTER — Encounter: Payer: Self-pay | Admitting: Internal Medicine

## 2023-08-19 MED ORDER — DILTIAZEM HCL 30 MG PO TABS: 30.0000 mg | ORAL_TABLET | Freq: Three times a day (TID) | ORAL | 3 refills | Status: DC | PRN

## 2023-08-26 ENCOUNTER — Other Ambulatory Visit: Payer: Self-pay | Admitting: Rheumatology

## 2023-08-26 ENCOUNTER — Other Ambulatory Visit: Payer: Self-pay | Admitting: Internal Medicine

## 2023-08-26 DIAGNOSIS — M0579 Rheumatoid arthritis with rheumatoid factor of multiple sites without organ or systems involvement: Secondary | ICD-10-CM

## 2023-08-26 DIAGNOSIS — Z79899 Other long term (current) drug therapy: Secondary | ICD-10-CM

## 2023-08-26 NOTE — Telephone Encounter (Signed)
 Last Fill: 06/11/2023  Eye exam: not on file. Scheduled for Jan. 2025   Labs: 08/01/2023 CBC and CMP are normal.   Next Visit: 10/08/2023  Last Visit: 08/01/2023  IK:Myzlfjunpi arthritis with rheumatoid factor of multiple sites without organ or systems involvement   Current Dose per office note 08/01/2023: Plaquenil  200 mg p.o. twice daily Monday to Friday   Okay to refill Plaquenil ?

## 2023-09-11 ENCOUNTER — Encounter: Payer: BC Managed Care – PPO | Admitting: Rheumatology

## 2023-09-24 NOTE — Progress Notes (Signed)
 Office Visit Note  Patient: Alexandra Grant             Date of Birth: [birth year]           MRN: [id number]             PCP: Pearline Cables, MD Referring: Pearline Cables, MD Visit Date: 10/08/2023 Occupation: @GUAROCC @  Subjective:  Medication management.   History of Present Illness: Alexandra Grant is a 63 y.o. female with seropositive rheumatoid arthritis and osteoarthritis.  She states she has been taking hydroxychloroquine 200 mg twice daily Monday to Friday without any interruption since October 2024.  She has not had a flare of rheumatoid arthritis since the last visit.  She had a prednisone taper in December and has been off prednisone since then.  She denies any side effects from hydroxychloroquine.  She states none of her joints are painful except for her right bunion.  She has not noticed any joint swelling.    Activities of Daily Living:  Patient reports morning stiffness for 0 minutes.   Patient Denies nocturnal pain.  Difficulty dressing/grooming: Denies Difficulty climbing stairs: Denies Difficulty getting out of chair: Denies Difficulty using hands for taps, buttons, cutlery, and/or writing: Denies  Review of Systems  Constitutional:  Negative for fatigue.  HENT:  Negative for mouth sores and mouth dryness.   Eyes:  Negative for dryness.  Respiratory:  Negative for shortness of breath and difficulty breathing.   Cardiovascular:  Negative for chest pain and palpitations.  Gastrointestinal:  Negative for blood in stool, constipation and diarrhea.  Endocrine: Negative for increased urination.  Genitourinary:  Negative for involuntary urination.  Musculoskeletal:  Negative for joint pain, gait problem, joint pain, joint swelling, myalgias, muscle weakness, morning stiffness, muscle tenderness and myalgias.  Skin:  Negative for color change, rash, hair loss and sensitivity to sunlight.  Allergic/Immunologic: Negative for susceptible to infections.   Neurological:  Negative for dizziness and headaches.  Hematological:  Negative for swollen glands.  Psychiatric/Behavioral:  Positive for sleep disturbance. Negative for depressed mood. The patient is not nervous/anxious.     PMFS History:  Patient Active Problem List   Diagnosis Date Noted   Rheumatoid arthritis with rheumatoid factor of multiple sites without organ or systems involvement (HCC) 10/08/2023   Colon cancer screening 09/21/2022   Gastroesophageal reflux disease 09/21/2022   Mixed hyperlipidemia 08/23/2021   Elevated blood pressure reading 05/22/2021   Pruritus of vulva 04/21/2021   Insomnia 09/15/2020   Family history of osteoporosis 07/30/2019   Increased body mass index 07/30/2019   Anxiety 07/13/2019   Vitamin D deficiency 07/13/2019   Paroxysmal supraventricular tachycardia (HCC) 09/11/2017   Paroxysmal atrial fibrillation (HCC) 09/11/2017   Asthma in adult, moderate persistent, uncomplicated 06/13/2017   Osteoporosis without current pathological fracture 12/24/2016   DUB (dysfunctional uterine bleeding) 04/27/2013    Past Medical History:  Diagnosis Date   Asthma    adult onset, confirmed with pre- and post-bronchodialator PFTs 06/13/2017 at PCP.   Menorrhagia 2014   Osteoporosis    Paroxysmal atrial fibrillation (HCC)    Paroxysmal SVT (supraventricular tachycardia) (HCC)     Family History  Problem Relation Age of Onset   Hypertension Mother    Heart disease Mother        stent placement   Hyperlipidemia Mother    Asthma Father    Other Father        motor vehicle accident- age 14   Healthy Brother  Diabetes Maternal Aunt    Heart disease Maternal Grandmother    Healthy Daughter    Healthy Daughter    Past Surgical History:  Procedure Laterality Date   SVT ABLATION N/A 07/04/2020   Procedure: SVT ABLATION;  Surgeon: Marinus Maw, MD;  Location: [facility] INVASIVE CV LAB;  Service: Cardiovascular;  Laterality: N/A;   Social History   Social  History Narrative   Married since 1985. Has 2 daughter and 2 granddaughters. Husband is retired Radiation protection practitioner.   Grew up in [place]. Her dad died before she was born. Her stepdad adopted her, had a great childhood. No h/o abuse. Dad was a used Quarry manager. Mom was an Engineer, structural.       Caffeine-none now.   Legal- none   Religious-Christian   Immunization History  Administered Date(s) Administered   Influenza Inj Mdck Quad With Preservative 05/14/2019   Influenza Split 06/27/2020   Influenza,inj,Quad PF,6+ Mos 04/23/2014, 04/10/2016, 05/15/2021   Influenza-Unspecified 04/23/2014, 04/10/2016, 04/13/2021   Moderna Sars-Covid-2 Vaccination 11/09/2019, 12/07/2019   Pfizer(Comirnaty)Fall Seasonal Vaccine 12 years and older 12-15-61   Tdap 10/12/2015   Zoster Recombinant(Shingrix) 01/25/2020, 05/15/2021     Objective: Vital Signs: BP 113/79 (BP Location: Left Arm, Patient Position: Sitting, Cuff Size: Normal)   Pulse 80   Resp 14   Ht 5\' 6"  (1.676 m)   Wt 160 lb 12.8 oz (72.9 kg)   LMP 07/22/2013   BMI 25.95 kg/m    Physical Exam Vitals and nursing note reviewed.  Constitutional:      Appearance: She is well-developed.  HENT:     Head: Normocephalic and atraumatic.  Eyes:     Conjunctiva/sclera: Conjunctivae normal.  Cardiovascular:     Rate and Rhythm: Normal rate and regular rhythm.     Heart sounds: Normal heart sounds.  Pulmonary:     Effort: Pulmonary effort is normal.     Breath sounds: Normal breath sounds.  Abdominal:     General: Bowel sounds are normal.     Palpations: Abdomen is soft.  Musculoskeletal:     Cervical back: Normal range of motion.  Lymphadenopathy:     Cervical: No cervical adenopathy.  Skin:    General: Skin is warm and dry.     Capillary Refill: Capillary refill takes less than 2 seconds.  Neurological:     Mental Status: She is alert and oriented to person, place, and time.  Psychiatric:        Behavior: Behavior normal.       Musculoskeletal Exam: Cervical, thoracic and lumbar spine 1 good range of motion.  Shoulder joints, elbow joints, wrist joints, MCPs PIPs and DIPs were in good range of motion.  She had mild PIP and DIP thickening with no synovitis.  Hip joints and knee joints were in good range of motion.  She had right first MTP bunion.  No synovitis was noted.  CDAI Exam: CDAI Score: -- Patient Global: 0 / 100; Provider Global: 0 / 100 Swollen: --; Tender: -- Joint Exam 10/08/2023   No joint exam has been documented for this visit   There is currently no information documented on the homunculus. Go to the Rheumatology activity and complete the homunculus joint exam.  Investigation: No additional findings.  Imaging: No results found.  Recent Labs: Lab Results  Component Value Date   WBC 7.7 08/01/2023   HGB 14.9 08/01/2023   PLT 391 08/01/2023   NA 139 08/01/2023   K 4.6 08/01/2023   CL  102 08/01/2023   CO2 29 08/01/2023   GLUCOSE 91 08/01/2023   BUN 16 08/01/2023   CREATININE 0.88 08/01/2023   BILITOT 0.6 08/01/2023   ALKPHOS 80 11/21/2022   AST 14 08/01/2023   ALT 13 08/01/2023   PROT 6.9 08/01/2023   ALBUMIN 4.6 11/21/2022   CALCIUM 10.2 08/01/2023   GFRAA 86 07/01/2020   QFTBGOLDPLUS NEGATIVE 06/11/2023    Speciality Comments: Plaquenil June 11, 2023  Procedures:  No procedures performed Allergies: Patient has no known allergies.   Assessment / Plan:     Visit Diagnoses: Rheumatoid arthritis with rheumatoid factor of multiple sites without organ or systems involvement (HCC) - + RF, + anti-CCP, - ANA: Rheumatoid arthritis is well-controlled on hydroxychloroquine 200 mg p.o. twice daily.  She has been taking it since October 2024.  She had a flare in December which responded to prednisone taper.  She denies any joint pain or joint swelling today.  High risk medication use - Plaquenil 200 mg p.o. twice daily Monday to Friday.  Labs from December 2025 were reviewed which  are within normal limits.  She was advised to get repeat labs in March and then every 5 months.  Information on immunization was placed in the AVS.  Patient states that she just received her insurance and will schedule eye examination.  Chronic pain of both shoulders -she is not having discomfort in her shoulders now.  She had good range of motion of bilateral shoulders.  Xrays of bilateral shoulders obtained were unremarkable.  Pain in both hands -she denies any discomfort in her hands.  Bilateral PIP and DIP thickening with no synovitis was noted.  X-rays obtained showed generalized osteopenia and osteoarthritic changes.  Pain in both feet -she continue to have some discomfort in her right first MTP.  No synovitis was noted.  Proper fitting shoes were emphasized.  X-ray obtained initial showed osteoarthritic changes.  Sicca syndrome (HCC) - Dry mouth.  SSA negative, SSB negative.  Over-the-counter products were discussed.  Age-related osteoporosis without current pathological fracture - October 25, 2022 DEXA scan left femoral neck T-score -2.6, BMD 0.670. Patient declined treatment.  Use of calcium rich diet and vitamin D was advised.  She walks on the treadmill.  Resistive exercises were advised.  Vitamin D deficiency-she takes vitamin D 2000 units daily.  Other medical problems listed as follows:  Paroxysmal atrial fibrillation (HCC)  Paroxysmal supraventricular tachycardia (HCC)  Mixed hyperlipidemia  Primary insomnia  Gastroesophageal reflux disease without esophagitis  Anxiety  Increased body mass index  Former smoker  Family history of osteoporosis  Orders: No orders of the defined types were placed in this encounter.  No orders of the defined types were placed in this encounter.    Follow-Up Instructions: Return in about 5 months (around 03/06/2024) for Rheumatoid arthritis, Osteoarthritis.   Pollyann Savoy, MD  Note - This record has been created using [employer].  Chart creation errors have been sought, but may not always  have been located. Such creation errors do not reflect on  the standard of medical care.

## 2023-10-08 ENCOUNTER — Encounter: Payer: Self-pay | Admitting: Rheumatology

## 2023-10-08 ENCOUNTER — Ambulatory Visit: Payer: 59 | Attending: Rheumatology | Admitting: Rheumatology

## 2023-10-08 ENCOUNTER — Ambulatory Visit: Payer: BC Managed Care – PPO | Admitting: Rheumatology

## 2023-10-08 VITALS — BP 113/79 | HR 80 | Resp 14 | Ht 66.0 in | Wt 160.8 lb

## 2023-10-08 DIAGNOSIS — Z8262 Family history of osteoporosis: Secondary | ICD-10-CM

## 2023-10-08 DIAGNOSIS — Z79899 Other long term (current) drug therapy: Secondary | ICD-10-CM | POA: Diagnosis not present

## 2023-10-08 DIAGNOSIS — M79641 Pain in right hand: Secondary | ICD-10-CM

## 2023-10-08 DIAGNOSIS — M25512 Pain in left shoulder: Secondary | ICD-10-CM

## 2023-10-08 DIAGNOSIS — M79642 Pain in left hand: Secondary | ICD-10-CM

## 2023-10-08 DIAGNOSIS — M79672 Pain in left foot: Secondary | ICD-10-CM

## 2023-10-08 DIAGNOSIS — M810 Age-related osteoporosis without current pathological fracture: Secondary | ICD-10-CM

## 2023-10-08 DIAGNOSIS — M3500 Sicca syndrome, unspecified: Secondary | ICD-10-CM

## 2023-10-08 DIAGNOSIS — R638 Other symptoms and signs concerning food and fluid intake: Secondary | ICD-10-CM

## 2023-10-08 DIAGNOSIS — M25511 Pain in right shoulder: Secondary | ICD-10-CM

## 2023-10-08 DIAGNOSIS — I480 Paroxysmal atrial fibrillation: Secondary | ICD-10-CM

## 2023-10-08 DIAGNOSIS — Z87891 Personal history of nicotine dependence: Secondary | ICD-10-CM

## 2023-10-08 DIAGNOSIS — F419 Anxiety disorder, unspecified: Secondary | ICD-10-CM

## 2023-10-08 DIAGNOSIS — K219 Gastro-esophageal reflux disease without esophagitis: Secondary | ICD-10-CM

## 2023-10-08 DIAGNOSIS — I471 Supraventricular tachycardia, unspecified: Secondary | ICD-10-CM

## 2023-10-08 DIAGNOSIS — E559 Vitamin D deficiency, unspecified: Secondary | ICD-10-CM

## 2023-10-08 DIAGNOSIS — M0579 Rheumatoid arthritis with rheumatoid factor of multiple sites without organ or systems involvement: Secondary | ICD-10-CM | POA: Diagnosis not present

## 2023-10-08 DIAGNOSIS — G8929 Other chronic pain: Secondary | ICD-10-CM

## 2023-10-08 DIAGNOSIS — F5101 Primary insomnia: Secondary | ICD-10-CM

## 2023-10-08 DIAGNOSIS — E782 Mixed hyperlipidemia: Secondary | ICD-10-CM

## 2023-10-08 DIAGNOSIS — M79671 Pain in right foot: Secondary | ICD-10-CM

## 2023-10-08 NOTE — Patient Instructions (Signed)
 Standing Labs We placed an order today for your standing lab work.   Please have your standing labs drawn in March and every 5 months  Please have your labs drawn 2 weeks prior to your appointment so that the provider can discuss your lab results at your appointment, if possible.  Please note that you may see your imaging and lab results in MyChart before we have reviewed them. We will contact you once all results are reviewed. Please allow our office up to 72 hours to thoroughly review all of the results before contacting the office for clarification of your results.  WALK-IN LAB HOURS  Monday through Thursday from 8:00 am -12:30 pm and 1:00 pm-5:00 pm and Friday from 8:00 am-12:00 pm.  Patients with office visits requiring labs will be seen before walk-in labs.  You may encounter longer than normal wait times. Please allow additional time. Wait times may be shorter on  Monday and Thursday afternoons.  We do not book appointments for walk-in labs. We appreciate your patience and understanding with our staff.   Labs are drawn by [facility]. Please bring your co-pay at the time of your lab draw.  You may receive a bill from [facility] for your lab work.  Please note if you are on Hydroxychloroquine and and an order has been placed for a Hydroxychloroquine level,  you will need to have it drawn 4 hours or more after your last dose.  If you wish to have your labs drawn at another location, please call the office 24 hours in advance so we can fax the orders.  The office is located at [address], [address], [place], [place] [address]   If you have any questions regarding directions or hours of operation,  please call [phone].   As a reminder, please drink plenty of water prior to coming for your lab work. Thanks!   Vaccines You are taking a medication(s) that can suppress your immune system.  The following immunizations are recommended: Flu annually Covid-19 RSV  Td/Tdap (tetanus,  diphtheria, pertussis) every 10 years Pneumonia (Prevnar 15 then Pneumovax 23 at least 1 year apart.  Alternatively, can take Prevnar 20 without needing additional dose) Shingrix: 2 doses from 4 weeks to 6 months apart  Please check with your PCP to make sure you are up to date.

## 2023-10-09 ENCOUNTER — Encounter: Payer: Self-pay | Admitting: Internal Medicine

## 2023-10-09 ENCOUNTER — Telehealth: Payer: Self-pay | Admitting: Internal Medicine

## 2023-10-09 MED ORDER — METOPROLOL SUCCINATE ER 25 MG PO TB24: 25.0000 mg | ORAL_TABLET | Freq: Every day | ORAL | 3 refills | Status: AC

## 2023-10-09 NOTE — Telephone Encounter (Signed)
 Strips received and sent to Dr Ladona Ridgel.

## 2023-10-09 NOTE — Telephone Encounter (Signed)
 I called the pt and she reports that her HR is 78 now.. her husband is a Engineer, civil (consulting)... currently she feels well and says she is in NSR.... she only takes the Flecainide PRN per her PCP... she took one yesterday 100 mg but has not taken any for several weeks. She says she thinks they are expired. They were given to her ion 2022 by Shanda Bumps Copland.

## 2023-10-09 NOTE — Telephone Encounter (Signed)
 Per Dr Eden Emms the DOD pt to start Toprol 25 mg a day. I will send to Dr Ladona Ridgel for his  review. Pt says she has not had any difficulties with her asthma recently.   Pt to continue to monitor.   Her last EP note:  No indication for anti-coag as CHADS VASC is 1.

## 2023-10-09 NOTE — Telephone Encounter (Signed)
 Pt says she has been having more frequent palpitations and lasting longer each time... she had a "bad" day yesterday and had to lay down most of the day.. she did get an apple watch reading that she will send through [facility] Chart when she gets home later today.   She says she feels tired and denies dizziness and SOB but just makes her feel "bad".   She has been taking her Diltiazem nit has nt had much relief when taking it.   I will send a message to Dr Ladona Ridgel... not sure when we can get her into the office but wait to see what Dr Ladona Ridgel recommends.   Will watch for her My Chart message.

## 2023-10-09 NOTE — Telephone Encounter (Signed)
 Patient c/o Palpitations:  STAT if patient reporting lightheadedness, shortness of breath, or chest pain  How long have you had palpitations/irregular HR/ Afib? Are you having the symptoms now? Not in Afib at this time- it is happening more frequent and lasting longer  Are you currently experiencing lightheadedness, SOB or CP? no  Do you have a history of afib (atrial fibrillation) or irregular heart rhythm?   Have you checked your BP or HR? (document readings if available):   Are you experiencing any other symptoms? Scared, heart races more- patient wants to be seen. She was offered an appointment for 10-23-23. Patient wanted to be seen sooner if possible.

## 2023-10-10 ENCOUNTER — Telehealth: Payer: Self-pay | Admitting: Internal Medicine

## 2023-10-10 NOTE — Telephone Encounter (Signed)
 Returned pt call. Pt is very anxious about her afib (new), and symptomatic (not currently).  She would really like to see someone to talk about this as soon as possible. Reviewed w/ Yevette Edwards, PA. Pt aware I am forwarding this to afib clinic to call her and arrange visit next week to further discuss plan of care. Advised to call the office b/t now and then if she becomes symptomatic.  Aware I will not send in for the Flecainide at this time (not on her list and Dr. Ladona Ridgel has not responded) and that she can discuss this w/ the afib clinic next week when she sees them. Patient verbalized understanding and agreeable to plan.

## 2023-10-10 NOTE — Telephone Encounter (Signed)
 Patient stated she was returning RN's call.

## 2023-10-14 NOTE — Telephone Encounter (Signed)
 Sent to scheduling per staff message

## 2023-10-16 NOTE — Telephone Encounter (Signed)
 Agree with Dr. Eden Emms. Also, I would like her to get a new bottle of the flecainide to be used as pill in the pocket.

## 2023-10-17 ENCOUNTER — Ambulatory Visit (HOSPITAL_COMMUNITY)
Admission: RE | Admit: 2023-10-17 | Discharge: 2023-10-17 | Disposition: A | Discharge status: Home / Self Care | Payer: 59 | Source: Ambulatory Visit | Attending: Internal Medicine | Admitting: Internal Medicine

## 2023-10-17 VITALS — BP 122/76 | HR 56 | Ht 66.0 in | Wt 161.2 lb

## 2023-10-17 DIAGNOSIS — I480 Paroxysmal atrial fibrillation: Secondary | ICD-10-CM | POA: Diagnosis present

## 2023-10-17 DIAGNOSIS — Z79899 Other long term (current) drug therapy: Secondary | ICD-10-CM | POA: Insufficient documentation

## 2023-10-17 NOTE — Progress Notes (Signed)
 Primary Care Physician: Pearline Cables, MD Primary Cardiologist: Christell Constant, MD Electrophysiologist: Dr. Ladona Ridgel    Referring Physician: Dr. Freda Munro Alexandra Grant is a 64 y.o. female with a history of SVT s/p ablation, RA, and paroxysmal atrial fibrillation who presents for consultation in the [facility]. Recent telephone notes show increased PAF episodes; cardiologist started Toprol 25 mg daily. Historically, it appears she used flecainide pill in pocket strategy. Patient not on OAC; she has a CHADS2VASC score of 1.   On evaluation today, she is currently in NSR. She does show me on phone recent episodes of Afib that appear to be increased in February. She notes some fatigue since starting the Toprol but it has only been ~8 days. Not on OAC due to low risk score. She feels poorly when out of rhythm.   Today, she denies symptoms of chest pain, shortness of breath, orthopnea, PND, lower extremity edema, dizziness, presyncope, syncope, snoring, daytime somnolence, bleeding, or neurologic sequela. The patient is tolerating medications without difficulties and is otherwise without complaint today.   she has a BMI of Body mass index is 26.02 kg/m.Marland Kitchen Filed Weights   10/17/23 0829  Weight: 73.1 kg    Current Outpatient Medications  Medication Sig Dispense Refill   albuterol (VENTOLIN HFA) 108 (90 Base) MCG/ACT inhaler INHALE 1-2 PUFFS INTO THE LUNGS EVERY 4 HOURS AS NEEDED FOR WHEEZING OR SHORTNESS OF BREATH. 18 each 5   budesonide-formoterol (SYMBICORT) 160-4.5 MCG/ACT inhaler INHALE 2 PUFFS INTO THE LUNGS TWICE A DAY 10.2 each 2   CALCIUM-VITAMIN D PO Taking 1-2 chewables by mouth daily     diltiazem (CARDIZEM CD) 240 MG 24 hr capsule TAKE 1 CAPSULE (240 MG TOTAL) BY MOUTH AT BEDTIME. 90 capsule 1   diltiazem (CARDIZEM) 30 MG tablet Take 1 tablet (30 mg total) by mouth 3 (three) times daily as needed. At onset of fast heart rate 90 tablet 3    docusate sodium (COLACE) 100 MG capsule Take 100 mg by mouth at bedtime.     EQ ASPIRIN ADULT LOW DOSE 81 MG EC tablet Take 1 tablet by mouth once daily (Patient taking differently: Take 81 mg by mouth once a week.) 90 tablet 2   flecainide (TAMBOCOR) 100 MG tablet Take 100 mg by mouth as needed.     hydroxychloroquine (PLAQUENIL) 200 MG tablet TAKE ONE TABLET TWICE DAILY MONDAYS THROUGH FRIDAYS ONLY. DO NOT TAKE ON SATURDAY AND SUNDAY 120 tablet 0   meloxicam (MOBIC) 7.5 MG tablet TAKE 1 TABLET BY MOUTH DAILY AS NEEDED FOR PAIN 90 tablet 2   metoprolol succinate (TOPROL XL) 25 MG 24 hr tablet Take 1 tablet (25 mg total) by mouth daily. 90 tablet 3   No current facility-administered medications for this encounter.    Atrial Fibrillation Management history:  Previous antiarrhythmic drugs: flecainide Previous cardioversions: none Previous ablations: none Anticoagulation history: none   ROS- All systems are reviewed and negative except as per the HPI above.  Physical Exam: BP 122/76   Pulse (!) 56   Ht 5\' 6"  (1.676 m)   Wt 73.1 kg   LMP 07/22/2013   BMI 26.02 kg/m   GEN: Well nourished, well developed in no acute distress NECK: No JVD; No carotid bruits CARDIAC: Regular rate and rhythm, no murmurs, rubs, gallops RESPIRATORY:  Clear to auscultation without rales, wheezing or rhonchi  ABDOMEN: Soft, non-tender, non-distended EXTREMITIES:  No edema; No deformity  EKG today demonstrates  Vent. rate 56 BPM PR interval 136 ms QRS duration 88 ms QT/QTcB 440/424 ms P-R-T axes 56 27 74 Sinus bradycardia Cannot rule out Anterior infarct , age undetermined Abnormal ECG When compared with ECG of 13-Jun-2023 10:43, PREVIOUS ECG IS PRESENT  Echo N/A  ASSESSMENT & PLAN CHA2DS2-VASc Score = 1  The patient's score is based upon: CHF History: 0 HTN History: 0 Diabetes History: 0 Stroke History: 0 Vascular Disease History: 0 Age Score: 0 Gender Score: 1       ASSESSMENT  AND PLAN: Paroxysmal Atrial Fibrillation (ICD10:  I48.0) The patient's CHA2DS2-VASc score is 1, indicating a 0.6% annual risk of stroke.    She is currently in NSR. We discussed increased episodes of Afib recently and potential treatment options. We discussed beginning flecainide as a maintenance medication instead of pill in pocket strategy. We discussed cutting metoprolol in half to see if helps with fatigue. We discussed ablation as an option (if Dr. Ladona Ridgel agrees) considering her degree of symptoms when in Afib, recent increase in episodes, and hesitation to be on maintenance AAD therapy. After discussion, she will elect to continue metoprolol for several more days and determine if wants to reduce to half tablet. She will use the next couple of weeks as data for upcoming visit with Dr. Ladona Ridgel.      Follow up Afib clinic prn.    Lake Bells, PA-C  [facility] [address] [place], [place] [address] [phone]

## 2023-10-22 ENCOUNTER — Other Ambulatory Visit: Payer: Self-pay

## 2023-10-22 MED ORDER — FLECAINIDE ACETATE 100 MG PO TABS: 100.0000 mg | ORAL_TABLET | ORAL | 6 refills | Status: DC | PRN

## 2023-10-22 NOTE — Telephone Encounter (Signed)
Flecainide ordered.

## 2023-10-26 ENCOUNTER — Encounter: Payer: Self-pay | Admitting: Family Medicine

## 2023-10-26 DIAGNOSIS — U071 COVID-19: Secondary | ICD-10-CM | POA: Diagnosis not present

## 2023-10-27 MED ORDER — PREDNISONE 20 MG PO TABS: ORAL_TABLET | ORAL | 0 refills | Status: DC

## 2023-10-27 NOTE — Telephone Encounter (Signed)

## 2023-10-31 ENCOUNTER — Telehealth: Payer: Self-pay

## 2023-10-31 ENCOUNTER — Ambulatory Visit (HOSPITAL_BASED_OUTPATIENT_CLINIC_OR_DEPARTMENT_OTHER)
Admission: RE | Admit: 2023-10-31 | Discharge: 2023-10-31 | Disposition: A | Discharge status: Home / Self Care | Source: Ambulatory Visit | Attending: Medical | Admitting: Medical

## 2023-10-31 ENCOUNTER — Encounter: Payer: Self-pay | Admitting: Medical

## 2023-10-31 ENCOUNTER — Ambulatory Visit (INDEPENDENT_AMBULATORY_CARE_PROVIDER_SITE_OTHER): Admitting: Medical

## 2023-10-31 VITALS — BP 128/70 | HR 64 | Temp 97.9°F | Resp 18 | Ht 66.0 in | Wt 161.0 lb

## 2023-10-31 DIAGNOSIS — R059 Cough, unspecified: Secondary | ICD-10-CM

## 2023-10-31 DIAGNOSIS — U071 COVID-19: Secondary | ICD-10-CM

## 2023-10-31 DIAGNOSIS — H6690 Otitis media, unspecified, unspecified ear: Secondary | ICD-10-CM | POA: Diagnosis not present

## 2023-10-31 MED ORDER — BENZONATATE 100 MG PO CAPS: 100.0000 mg | ORAL_CAPSULE | Freq: Three times a day (TID) | ORAL | 0 refills | Status: DC | PRN

## 2023-10-31 MED ORDER — FLUTICASONE PROPIONATE 50 MCG/ACT NA SUSP: 2.0000 | Freq: Every day | NASAL | 1 refills | Status: DC

## 2023-10-31 MED ORDER — NEOMYCIN-POLYMYXIN-HC 3.5-10000-1 OT SOLN: 3.0000 [drp] | Freq: Four times a day (QID) | OTIC | 0 refills | Status: DC

## 2023-10-31 MED ORDER — AMOXICILLIN-POT CLAVULANATE 875-125 MG PO TABS: 1.0000 | ORAL_TABLET | Freq: Two times a day (BID) | ORAL | 0 refills | Status: DC

## 2023-10-31 MED ORDER — METHYLPREDNISOLONE 4 MG PO TABS: ORAL_TABLET | ORAL | 0 refills | Status: DC

## 2023-10-31 NOTE — Patient Instructions (Signed)
 COVID-19 infection Tested positive for COVID-19 with symptoms including nasal congestion, cough, rhinorrhea, and fatigue. Ineligible for Paxlovid due to timing and rheumatoid arthritis(pt concerns on interactions). Prednisone improved wheezing and chest congestion. -flonase for congestion. -benzonatate for cough -- Order chest x-ray to rule out pneumonia.  Acute otitis media Ear fullness and nocturnal otalgia with erythematous tympanic membrane suggest secondary bacterial infection post-COVID-19. Augmentin preferred for treatment. - Prescribe Augmentin. - Monitor for increased pain or postauricular swelling, indicative of mastoiditis. - Consider CT scan if pain worsens below or behind ear. Any severe changes over weekend then be seen in ED. - Schedule follow-up in 7 days, with option to cancel if symptoms resolve.  Asthma Asthma usually well-controlled. Recent wheezing and chest congestion improved with prednisone and Symbicort. Oxygen saturation satisfactory. Chest x-ray ordered to rule out pneumonia. - Continue Symbicort as prescribed. - Consider Medrol dose pack for potential weekend flare-up, advise to hold unless symptoms worsen.   Follow up in one week or sooner if needed.

## 2023-10-31 NOTE — Progress Notes (Signed)
 Subjective:    Patient ID: Alexandra Grant, female    DOB: [birth year], 62 y.o.   MRN: [id number]  HPI Discussed the use of AI scribe software for clinical note transcription with the patient, who gave verbal consent to proceed.  History of Present Illness   Alexandra Grant is a 63 year old female with asthma who presents with COVID-19 symptoms and ear pain.  She began experiencing symptoms of COVID-19 on Thursday last week, initially mistaking them for allergies. By Saturday, she tested positive for COVID-19. Her symptoms included nasal congestion, cough, runny nose, and significant exhaustion. The nasal congestion was particularly bothersome, causing a sensation of fullness and difficulty hearing, which worsened to pain last night, affecting her sleep. The nasal discharge was described as yellowish-green. No muscle aches, but she confirmed nasal congestion, cough, runny nose, and exhaustion. She had a productive cough and chest congestion, particularly on Sunday and Monday nights.  She experienced severe ear pain last night, which radiated down her throat, and noted that lying down exacerbated the pain. The pain was severe enough to prevent sleep, requiring her to sit up. She also mentioned a scratchy throat, though it was not painful. She confirmed ear pain and fullness, with radiation down her throat, and noted that lying down worsened the pain. No sinus pain, but persistent nasal discharge was reported.  She has a history of asthma, which is usually well-controlled. During this illness, she experienced chest congestion and wheezing, particularly on Sunday and Monday nights, which was alleviated by using her rescue inhaler. She has been using Symbicort twice daily and noted improvement in wheezing since starting prednisone on Sunday. Her oxygen saturation was noted to be 96-100% during this period. She was prescribed prednisone, 40 mg daily, started daily for 5 days. She has been taking  each  morning. She also mentioned using over-the-counter medications like DayQuil for symptom relief, which she believes has helped manage her symptoms.               Review of Systems  Constitutional:  Negative for chills, fatigue and fever.  HENT:  Positive for congestion and ear pain. Negative for ear discharge.   Respiratory:  Negative for cough, chest tightness, shortness of breath and wheezing.   Cardiovascular:  Negative for chest pain and palpitations.  Gastrointestinal:  Negative for abdominal pain.  Genitourinary:  Negative for dysuria and flank pain.  Musculoskeletal:  Negative for back pain.  Neurological:  Negative for facial asymmetry and light-headedness.  Hematological:  Negative for adenopathy. Does not bruise/bleed easily.  Psychiatric/Behavioral:  Negative for behavioral problems, decreased concentration and dysphoric mood.     Past Medical History:  Diagnosis Date   Asthma    adult onset, confirmed with pre- and post-bronchodialator PFTs 06/13/2017 at PCP.   Menorrhagia 2014   Osteoporosis    Paroxysmal atrial fibrillation (HCC)    Paroxysmal SVT (supraventricular tachycardia) (HCC)      Social History   Socioeconomic History   Marital status: Married    Spouse name: Not on file   Number of children: Not on file   Years of education: Not on file   Highest education level: Not on file  Occupational History   Occupation: Diplomatic Services operational officer    Comment: [employer] for 25 years  Tobacco Use   Smoking status: Former    Types: Cigarettes    Passive exposure: Past   Smokeless tobacco: Never   Tobacco comments:    QUIT 2015  Vaping Use  Vaping status: Never Used  Substance and Sexual Activity   Alcohol use: No   Drug use: Not Currently   Sexual activity: Yes    Partners: Male    Birth control/protection: Other-see comments    Comment: vasectomy, INTERCOURSE AGE 17SEXUAL PARTNERS LESS THAN 5  Other Topics Concern   Not on file  Social History Narrative   Married  since 1985. Has 2 daughter and 2 granddaughters. Husband is retired Radiation protection practitioner.   Grew up in [place]. Her dad died before she was born. Her stepdad adopted her, had a great childhood. No h/o abuse. Dad was a used Quarry manager. Mom was an Engineer, structural.       Caffeine-none now.   Legal- none   Religious-Christian   Social Drivers of Corporate investment banker Strain: Low Risk  (10/28/2020)   Overall Financial Resource Strain (CARDIA)    Difficulty of Paying Living Expenses: Not hard at all  Food Insecurity: No Food Insecurity (10/28/2020)   Hunger Vital Sign    Worried About Running Out of Food in the Last Year: Never true    Ran Out of Food in the Last Year: Never true  Transportation Needs: No Transportation Needs (10/28/2020)   PRAPARE - Administrator, Civil Service (Medical): No    Lack of Transportation (Non-Medical): No  Physical Activity: Inactive (10/28/2020)   Exercise Vital Sign    Days of Exercise per Week: 0 days    Minutes of Exercise per Session: 0 min  Stress: Stress Concern Present (10/28/2020)   [employer] of Occupational Health - Occupational Stress Questionnaire    Feeling of Stress : Rather much  Social Connections: Moderately Integrated (10/28/2020)   Social Connection and Isolation Panel [NHANES]    Frequency of Communication with Friends and Family: More than three times a week    Frequency of Social Gatherings with Friends and Family: More than three times a week    Attends Religious Services: More than 4 times per year    Active Member of [employer] or Organizations: No    Attends Banker Meetings: Never    Marital Status: Married  Catering manager Violence: Not on file    Past Surgical History:  Procedure Laterality Date   SVT ABLATION N/A 07/04/2020   Procedure: SVT ABLATION;  Surgeon: Marinus Maw, MD;  Location: [facility];  Service: Cardiovascular;  Laterality: N/A;    Family History  Problem Relation Age of Onset    Hypertension Mother    Heart disease Mother        stent placement   Hyperlipidemia Mother    Asthma Father    Other Father        motor vehicle accident- age 28   Healthy Brother    Diabetes Maternal Aunt    Heart disease Maternal Grandmother    Healthy Daughter    Healthy Daughter     No Known Allergies  Current Outpatient Medications on File Prior to Visit  Medication Sig Dispense Refill   albuterol (VENTOLIN HFA) 108 (90 Base) MCG/ACT inhaler INHALE 1-2 PUFFS INTO THE LUNGS EVERY 4 HOURS AS NEEDED FOR WHEEZING OR SHORTNESS OF BREATH. 18 each 5   budesonide-formoterol (SYMBICORT) 160-4.5 MCG/ACT inhaler INHALE 2 PUFFS INTO THE LUNGS TWICE A DAY 10.2 each 2   CALCIUM-VITAMIN D PO Taking 1-2 chewables by mouth daily     diltiazem (CARDIZEM CD) 240 MG 24 hr capsule TAKE 1 CAPSULE (240 MG TOTAL) BY  MOUTH AT BEDTIME. 90 capsule 1   diltiazem (CARDIZEM) 30 MG tablet Take 1 tablet (30 mg total) by mouth 3 (three) times daily as needed. At onset of fast heart rate 90 tablet 3   docusate sodium (COLACE) 100 MG capsule Take 100 mg by mouth at bedtime.     EQ ASPIRIN ADULT LOW DOSE 81 MG EC tablet Take 1 tablet by mouth once daily (Patient taking differently: Take 81 mg by mouth once a week.) 90 tablet 2   flecainide (TAMBOCOR) 100 MG tablet Take 1 tablet (100 mg total) by mouth as needed. 30 tablet 6   hydroxychloroquine (PLAQUENIL) 200 MG tablet TAKE ONE TABLET TWICE DAILY MONDAYS THROUGH FRIDAYS ONLY. DO NOT TAKE ON SATURDAY AND SUNDAY 120 tablet 0   meloxicam (MOBIC) 7.5 MG tablet TAKE 1 TABLET BY MOUTH DAILY AS NEEDED FOR PAIN 90 tablet 2   metoprolol succinate (TOPROL XL) 25 MG 24 hr tablet Take 1 tablet (25 mg total) by mouth daily. 90 tablet 3   No current facility-administered medications on file prior to visit.    BP 128/70   Pulse 64   Temp 97.9 F (36.6 C)   Resp 18   Ht 5\' 6"  (1.676 m)   Wt 161 lb (73 kg)   LMP 07/22/2013   SpO2 96%   BMI 25.99 kg/m          Objective:   Physical Exam  General Mental Status- Alert. General Appearance- Not in acute distress.   Skin General: Color- Normal Color. Moisture- Normal Moisture.  Neck Carotid Arteries- Normal color. Moisture- Normal Moisture. No carotid bruits. No JVD.  Chest and Lung Exam Auscultation: Breath Sounds:even and unlabored. But wheezing/faint rough breath sounds  at lung bases.  Cardiovascular Auscultation:Rythm- RRR Murmurs & Other Heart Sounds:Auscultation of the heart reveals- No Murmurs.  Abdomen Inspection:-Inspeection Normal. Palpation/Percussion:Note:No mass. Palpation and Percussion of the abdomen reveal- Non Tender, Non Distended + BS, no rebound or guarding.   Neurologic Cranial Nerve exam:- CN III-XII intact(No nystagmus), symmetric smile. Strength:- 5/5 equal and symmetric strength both upper and lower extremities.   Heent- mild maxillary sinus pressure left side. Left ear-Faint tender below ear and behind ear lobe. TM left side bright upper 1/3 portion of tm. Rt side canal clear and normal tm.    Assessment & Plan:   Assessment and Plan    COVID-19 infection Tested positive for COVID-19 with symptoms including nasal congestion, cough, rhinorrhea, and fatigue. Ineligible for Paxlovid due to timing and rheumatoid arthritis(pt concerns on interactions). Prednisone improved wheezing and chest congestion. -flonase for congestion. -benzonatate for cough -- Order chest x-ray to rule out pneumonia.  Acute otitis media Ear fullness and nocturnal otalgia with erythematous tympanic membrane suggest secondary bacterial infection post-COVID-19. Augmentin preferred for treatment. - Prescribe Augmentin. - Monitor for increased pain or postauricular swelling, indicative of mastoiditis. - Consider CT scan if pain worsens below or behind ear. Any severe changes over weekend then be seen in ED. - Schedule follow-up in 7 days, with option to cancel if symptoms  resolve.  Asthma Asthma usually well-controlled. Recent wheezing and chest congestion improved with prednisone and Symbicort. Oxygen saturation satisfactory. Chest x-ray ordered to rule out pneumonia. - Continue Symbicort as prescribed. - Consider Medrol dose pack for potential weekend flare-up, advise to hold unless symptoms worsen.   Follow up in one week or sooner if needed.   Esperanza Richters, PA-C

## 2023-10-31 NOTE — Telephone Encounter (Signed)
 Who Is Calling Patient / Member / Family / Caregiver Call Type Triage / Clinical Relationship To Patient Self Return Phone Number [phone] (Primary) Chief Complaint Earache Reason for Call Symptomatic / Request for Health Information Initial Comment Caller States her left ear is clogged, had covid last week, ear pain Translation No Nurse Assessment Nurse: Wyn Quaker, RN, Marylene Land Date/Time (Eastern Time): 10/31/2023 6:07:28 AM Confirm and document reason for call. If symptomatic, describe symptoms. ---Caller stated that she has left ear pain x1 week or so. She had COVID since Friday (almost a week ago).  Final Disposition 10/31/2023 6:16:16 AM Call PCP within 24 Hours Yes Dew, RN, Marylene Land

## 2023-10-31 NOTE — Telephone Encounter (Signed)
 Copied from CRM [id number]. Topic: General - Other >> Oct 31, 2023  7:57 AM Rodman Pickle T wrote: Reason for CRM: patient is still having ear pain and the pain is going down her neck she wanted to see if she can get a antibiotic she would like call back

## 2023-10-31 NOTE — Telephone Encounter (Signed)
 Patient scheduled for today with Edward at 920am to be evaluated.

## 2023-11-04 ENCOUNTER — Telehealth: Payer: Self-pay | Admitting: *Deleted

## 2023-11-04 ENCOUNTER — Ambulatory Visit: Payer: Self-pay

## 2023-11-04 ENCOUNTER — Encounter: Payer: Self-pay | Admitting: Internal Medicine

## 2023-11-04 ENCOUNTER — Ambulatory Visit: Attending: Internal Medicine | Admitting: Internal Medicine

## 2023-11-04 VITALS — BP 116/68 | HR 74 | Ht 66.0 in | Wt 162.2 lb

## 2023-11-04 DIAGNOSIS — R0683 Snoring: Secondary | ICD-10-CM | POA: Diagnosis not present

## 2023-11-04 DIAGNOSIS — I480 Paroxysmal atrial fibrillation: Secondary | ICD-10-CM | POA: Diagnosis not present

## 2023-11-04 MED ORDER — FLECAINIDE ACETATE 50 MG PO TABS
50.0000 mg | ORAL_TABLET | Freq: Two times a day (BID) | ORAL | 3 refills | Status: AC
Start: 2023-11-04 — End: ?

## 2023-11-04 NOTE — Progress Notes (Unsigned)
 [facility] [address] [place], [place] [address] [phone]  Date:  11/06/2023   Name:  Alexandra Grant   DOB:  [birth year]   MRN:  [id number]  PCP:  Pearline Cables, MD    Chief Complaint: ear congestion (Pt says her L hear has felt blocked since having covid about 1.5 weeks ago/)   History of Present Illness:  Alexandra Grant is a 63 y.o. very pleasant female patient who presents with the following:  Pt seen today with concern of her left ear being congested/difficulty hearing from left ear History of RA-treated with Plaquenil She also has history of SVT, see details below Last seen by myself virtually in August  She tested positive for covid recently-her symptoms started 14 days ago  Seen by cardiology earlier this week - Dr Ladona Ridgel Assess/Plan: PAF - we discussed twice daily flecainide as well as catheter ablation. I have recommended 50 mg of flecainide twice daily with 100 mg back up pill in the pocket. We may have to uptitrate. If she fails, we will consider catheter ablation.  Snoring - we will check an outpatient sleep study as she has PAF.  SVT - she is s/p ablation with no recurrent SVT.   She noted sneezing, coughing and clogging of her left ear for one week The ear has felt "totally clogged" for the last week Right ear is ok  Alexandra Grant treated her with augmentin on 3/20- she did not end up taking the prednisolone I did give her pred 40 mg by 5 days on 3/16 when she first contacted [place] regarding COVID diagnosis She tolerated this well and noted that your congestion getting worse after she finished the course No drainage from the ear She feels pressure in her ear but it does not really hurt at this time No fever or chills     Patient Active Problem List   Diagnosis Date Noted   Rheumatoid arthritis with rheumatoid factor of multiple sites without organ or systems involvement (HCC) 10/08/2023   Colon  cancer screening 09/21/2022   Gastroesophageal reflux disease 09/21/2022   Mixed hyperlipidemia 08/23/2021   Elevated blood pressure reading 05/22/2021   Pruritus of vulva 04/21/2021   Insomnia 09/15/2020   Family history of osteoporosis 07/30/2019   Increased body mass index 07/30/2019   Anxiety 07/13/2019   Vitamin D deficiency 07/13/2019   Paroxysmal supraventricular tachycardia (HCC) 09/11/2017   Paroxysmal atrial fibrillation (HCC) 09/11/2017   Asthma in adult, moderate persistent, uncomplicated 06/13/2017   Osteoporosis without current pathological fracture 12/24/2016   DUB (dysfunctional uterine bleeding) 04/27/2013    Past Medical History:  Diagnosis Date   Asthma    adult onset, confirmed with pre- and post-bronchodialator PFTs 06/13/2017 at PCP.   Menorrhagia 2014   Osteoporosis    Paroxysmal atrial fibrillation (HCC)    Paroxysmal SVT (supraventricular tachycardia) (HCC)     Past Surgical History:  Procedure Laterality Date   SVT ABLATION N/A 07/04/2020   Procedure: SVT ABLATION;  Surgeon: Marinus Maw, MD;  Location: [facility] INVASIVE CV LAB;  Service: Cardiovascular;  Laterality: N/A;    Social History   Tobacco Use   Smoking status: Former    Types: Cigarettes    Passive exposure: Past   Smokeless tobacco: Never   Tobacco comments:    QUIT 2015  Vaping Use   Vaping status: Never Used  Substance Use Topics   Alcohol use: No  Drug use: Not Currently    Family History  Problem Relation Age of Onset   Hypertension Mother    Heart disease Mother        stent placement   Hyperlipidemia Mother    Asthma Father    Other Father        motor vehicle accident- age 19   Healthy Brother    Diabetes Maternal Aunt    Heart disease Maternal Grandmother    Healthy Daughter    Healthy Daughter     No Known Allergies  Medication list has been reviewed and updated.  Current Outpatient Medications on File Prior to Visit  Medication Sig Dispense Refill    albuterol (VENTOLIN HFA) 108 (90 Base) MCG/ACT inhaler INHALE 1-2 PUFFS INTO THE LUNGS EVERY 4 HOURS AS NEEDED FOR WHEEZING OR SHORTNESS OF BREATH. 18 each 5   amoxicillin-clavulanate (AUGMENTIN) 875-125 MG tablet Take 1 tablet by mouth 2 (two) times daily. 20 tablet 0   benzonatate (TESSALON) 100 MG capsule Take 1 capsule (100 mg total) by mouth 3 (three) times daily as needed for cough. 30 capsule 0   budesonide-formoterol (SYMBICORT) 160-4.5 MCG/ACT inhaler INHALE 2 PUFFS INTO THE LUNGS TWICE A DAY 10.2 each 2   CALCIUM-VITAMIN D PO Taking 1-2 chewables by mouth daily     diltiazem (CARDIZEM CD) 240 MG 24 hr capsule TAKE 1 CAPSULE (240 MG TOTAL) BY MOUTH AT BEDTIME. 90 capsule 1   diltiazem (CARDIZEM) 30 MG tablet Take 1 tablet (30 mg total) by mouth 3 (three) times daily as needed. At onset of fast heart rate 90 tablet 3   docusate sodium (COLACE) 100 MG capsule Take 100 mg by mouth at bedtime.     EQ ASPIRIN ADULT LOW DOSE 81 MG EC tablet Take 1 tablet by mouth once daily (Patient taking differently: Take 81 mg by mouth once a week.) 90 tablet 2   flecainide (TAMBOCOR) 50 MG tablet Take 1 tablet (50 mg total) by mouth 2 (two) times daily. 180 tablet 3   fluticasone (FLONASE) 50 MCG/ACT nasal spray Place 2 sprays into both nostrils daily. 16 g 1   hydroxychloroquine (PLAQUENIL) 200 MG tablet TAKE ONE TABLET TWICE DAILY MONDAYS THROUGH FRIDAYS ONLY. DO NOT TAKE ON SATURDAY AND SUNDAY 120 tablet 0   meloxicam (MOBIC) 7.5 MG tablet TAKE 1 TABLET BY MOUTH DAILY AS NEEDED FOR PAIN 90 tablet 2   metoprolol succinate (TOPROL XL) 25 MG 24 hr tablet Take 1 tablet (25 mg total) by mouth daily. 90 tablet 3   neomycin-polymyxin-hydrocortisone (CORTISPORIN) OTIC solution Place 3 drops into the left ear 4 (four) times daily. 10 mL 0   No current facility-administered medications on file prior to visit.    Review of Systems:  As per HPI- otherwise negative.   Physical Examination: Vitals:   11/06/23  1253  BP: 122/80  Pulse: 74  Resp: 18  Temp: 98 F (36.7 C)  SpO2: 98%   Vitals:   11/06/23 1253  Weight: 159 lb 3.2 oz (72.2 kg)  Height: 5\' 6"  (1.676 m)   Body mass index is 25.7 kg/m. Ideal Body Weight: Weight in (lb) to have BMI = 25: 154.6  GEN: no acute distress.  Normal weight, looks well HEENT: Atraumatic, Normocephalic.  Ears and Nose: No external deformity. CV: RRR, No M/G/R. No JVD. No thrill. No extra heart sounds. PULM: CTA B, no wheezes, crackles, rhonchi. No retractions. No resp. distress. No accessory muscle use. ABD: S, NT, ND, +BS.  No rebound. No HSM. EXTR: No c/c/e PSYCH: Normally interactive. Conversant.  Left ear canal is normal Minor injection of the left TM compared with the right Right TM and external ear canal are normal.  No redness or tenderness over the mastoid.  Assessment and Plan: Hearing loss of left ear, unspecified hearing loss type - Plan: predniSONE (DELTASONE) 20 MG tablet, Ambulatory referral to ENT  Patient seen today with concern of left-sided hearing loss.  Suspect this is conductive due to eustachian tube dysfunction secondary to recent COVID-19, but also consider sensorineural hearing loss.  She tolerates prednisone well.  Will have her take 60 mg of prednisone for 5 days, then taper over the following 5 days. I put in a referral to ENT in case we need to get her seen.  I have asked her to please let me know how she does over the next few days as far as her symptoms. Signed Abbe Amsterdam, MD

## 2023-11-04 NOTE — Patient Instructions (Addendum)
 Medication Instructions:  Your physician has recommended you make the following change in your medication:  Start flecainide to 50 mg two times daily.  Lab Work: None ordered.  You may go to any [facility] Location for your lab work:  [facility] - [address] ([facility]) - [address] [phone] [address]  [place] - [address]   [place]  - [address]   [place] - [address] - [address] [facility]  - [address] - [address] ([facility]   If you have labs (blood work) drawn today and your tests are completely normal, you will receive your results only by: [employer] (if you have MyChart)  If you have any lab test that is abnormal or we need to change your treatment, we will call you or send a MyChart message to review the results.  Testing/Procedures: Sleep study  Follow-Up: At [facility], you and your health needs are our priority.  As part of our continuing mission to provide you with exceptional heart care, we have created designated Provider Care Teams.  These Care Teams include your primary Cardiologist (physician) and Advanced Practice Providers (APPs -  Physician Assistants and Nurse Practitioners) who all work together to provide you with the care you need, when you need it.  Your next appointment:   2 weeks for EKG 3 months for Follow up with Dr Ladona Ridgel  The format for your next appointment:   In Person  Provider:   Lewayne Bunting, MD{or one of the following Advanced Practice Providers on your designated Care Team:   Francis Dowse, [place] Casimiro Needle "Mardelle Matte" [place], [place] Earnest Rosier, NP  Note: Remote monitoring is used to monitor your Pacemaker/ ICD from home. This monitoring reduces the number of office visits required to check your device to one time per year. It allows [place] to keep an eye on the functioning of your device  to ensure it is working properly.            Valet parking services will be available as well.

## 2023-11-04 NOTE — Progress Notes (Signed)
 HPI [name] returns today for ongoing evaluation and management of palpitations.  She is a very pleasant 63 year old woman with a history of recurrent and documented symptomatic SVT who underwent EP study and catheter ablation over 3 years ago.  At that time, we could not induce sustained SVT.  She did have echo beats and double echo beats noted.  She had no inducible atrial tachycardia, atrial flutter, atrial fibrillation, or AV reentrant tachycardia.  A slow pathway modification was carried out successfully.  Over the past few months she has had recurrent palpitations. She has an Scientist, physiological which clearly shows PAF. She has taken cardizem, toprol and pill in the pocket flecainide but has noticed an increase in the frequency of her symptoms. In addition, she snores at night.   No Known Allergies   Current Outpatient Medications  Medication Sig Dispense Refill   albuterol (VENTOLIN HFA) 108 (90 Base) MCG/ACT inhaler INHALE 1-2 PUFFS INTO THE LUNGS EVERY 4 HOURS AS NEEDED FOR WHEEZING OR SHORTNESS OF BREATH. 18 each 5   amoxicillin-clavulanate (AUGMENTIN) 875-125 MG tablet Take 1 tablet by mouth 2 (two) times daily. 20 tablet 0   benzonatate (TESSALON) 100 MG capsule Take 1 capsule (100 mg total) by mouth 3 (three) times daily as needed for cough. 30 capsule 0   budesonide-formoterol (SYMBICORT) 160-4.5 MCG/ACT inhaler INHALE 2 PUFFS INTO THE LUNGS TWICE A DAY 10.2 each 2   CALCIUM-VITAMIN D PO Taking 1-2 chewables by mouth daily     diltiazem (CARDIZEM CD) 240 MG 24 hr capsule TAKE 1 CAPSULE (240 MG TOTAL) BY MOUTH AT BEDTIME. 90 capsule 1   diltiazem (CARDIZEM) 30 MG tablet Take 1 tablet (30 mg total) by mouth 3 (three) times daily as needed. At onset of fast heart rate 90 tablet 3   docusate sodium (COLACE) 100 MG capsule Take 100 mg by mouth at bedtime.     EQ ASPIRIN ADULT LOW DOSE 81 MG EC tablet Take 1 tablet by mouth once daily (Patient taking differently: Take 81 mg by mouth  once a week.) 90 tablet 2   flecainide (TAMBOCOR) 100 MG tablet Take 1 tablet (100 mg total) by mouth as needed. 30 tablet 6   fluticasone (FLONASE) 50 MCG/ACT nasal spray Place 2 sprays into both nostrils daily. 16 g 1   hydroxychloroquine (PLAQUENIL) 200 MG tablet TAKE ONE TABLET TWICE DAILY MONDAYS THROUGH FRIDAYS ONLY. DO NOT TAKE ON SATURDAY AND SUNDAY 120 tablet 0   meloxicam (MOBIC) 7.5 MG tablet TAKE 1 TABLET BY MOUTH DAILY AS NEEDED FOR PAIN 90 tablet 2   methylPREDNISolone (MEDROL) 4 MG tablet Standard 6 day taper dose 21 tablet 0   metoprolol succinate (TOPROL XL) 25 MG 24 hr tablet Take 1 tablet (25 mg total) by mouth daily. 90 tablet 3   neomycin-polymyxin-hydrocortisone (CORTISPORIN) OTIC solution Place 3 drops into the left ear 4 (four) times daily. 10 mL 0   No current facility-administered medications for this visit.     Past Medical History:  Diagnosis Date   Asthma    adult onset, confirmed with pre- and post-bronchodialator PFTs 06/13/2017 at PCP.   Menorrhagia 2014   Osteoporosis    Paroxysmal atrial fibrillation (HCC)    Paroxysmal SVT (supraventricular tachycardia) (HCC)     ROS:   All systems reviewed and negative except as noted in the HPI.   Past Surgical History:  Procedure Laterality Date   SVT ABLATION N/A 07/04/2020   Procedure: SVT ABLATION;  Surgeon: Marinus Maw, MD;  Location: [facility] INVASIVE CV LAB;  Service: Cardiovascular;  Laterality: N/A;     Family History  Problem Relation Age of Onset   Hypertension Mother    Heart disease Mother        stent placement   Hyperlipidemia Mother    Asthma Father    Other Father        motor vehicle accident- age 19   Healthy Brother    Diabetes Maternal Aunt    Heart disease Maternal Grandmother    Healthy Daughter    Healthy Daughter      Social History   Socioeconomic History   Marital status: Married    Spouse name: Not on file   Number of children: Not on file   Years of education: Not  on file   Highest education level: Not on file  Occupational History   Occupation: Diplomatic Services operational officer    Comment: [employer] for 25 years  Tobacco Use   Smoking status: Former    Types: Cigarettes    Passive exposure: Past   Smokeless tobacco: Never   Tobacco comments:    QUIT 2015  Vaping Use   Vaping status: Never Used  Substance and Sexual Activity   Alcohol use: No   Drug use: Not Currently   Sexual activity: Yes    Partners: Male    Birth control/protection: Other-see comments    Comment: vasectomy, INTERCOURSE AGE 17SEXUAL PARTNERS LESS THAN 5  Other Topics Concern   Not on file  Social History Narrative   Married since 1985. Has 2 daughter and 2 granddaughters. Husband is retired Radiation protection practitioner.   Grew up in [place]. Her dad died before she was born. Her stepdad adopted her, had a great childhood. No h/o abuse. Dad was a used Quarry manager. Mom was an Engineer, structural.       Caffeine-none now.   Legal- none   Religious-Christian   Social Drivers of Corporate investment banker Strain: Low Risk  (10/28/2020)   Overall Financial Resource Strain (CARDIA)    Difficulty of Paying Living Expenses: Not hard at all  Food Insecurity: No Food Insecurity (10/28/2020)   Hunger Vital Sign    Worried About Running Out of Food in the Last Year: Never true    Ran Out of Food in the Last Year: Never true  Transportation Needs: No Transportation Needs (10/28/2020)   PRAPARE - Administrator, Civil Service (Medical): No    Lack of Transportation (Non-Medical): No  Physical Activity: Inactive (10/28/2020)   Exercise Vital Sign    Days of Exercise per Week: 0 days    Minutes of Exercise per Session: 0 min  Stress: Stress Concern Present (10/28/2020)   [employer] of Occupational Health - Occupational Stress Questionnaire    Feeling of Stress : Rather much  Social Connections: Moderately Integrated (10/28/2020)   Social Connection and Isolation Panel [NHANES]    Frequency of Communication with  Friends and Family: More than three times a week    Frequency of Social Gatherings with Friends and Family: More than three times a week    Attends Religious Services: More than 4 times per year    Active Member of [employer] or Organizations: No    Attends Banker Meetings: Never    Marital Status: Married  Catering manager Violence: Not on file     BP 116/68   Pulse 74   Ht 5\' 6"  (1.676 m)   Wt  162 lb 3.2 oz (73.6 kg)   LMP 07/22/2013   SpO2 97%   BMI 26.18 kg/m   Physical Exam:  Well appearing NAD HEENT: Unremarkable Neck:  No JVD, no thyromegally Lymphatics:  No adenopathy Back:  No CVA tenderness Lungs:  Clear with no wheezes HEART:  Regular rate rhythm, no murmurs, no rubs, no clicks Abd:  soft, positive bowel sounds, no organomegally, no rebound, no guarding Ext:  2 plus pulses, no edema, no cyanosis, no clubbing Skin:  No rashes no nodules Neuro:  CN II through XII intact, motor grossly intact  EKG - low atrial rhythm   Assess/Plan: PAF - we discussed twice daily flecainide as well as catheter ablation. I have recommended 50 mg of flecainide twice daily with 100 mg back up pill in the pocket. We may have to uptitrate. If she fails, we will consider catheter ablation.  Snoring - we will check an outpatient sleep study as she has PAF.  SVT - she is s/p ablation with no recurrent SVT.   Sharlot Gowda Rainier Feuerborn,MD

## 2023-11-04 NOTE — Telephone Encounter (Signed)
 Chief Complaint: Left Ear fullness Symptoms: Feels "clogged," affecting hearing Frequency: 1 week constant Pertinent Negatives: Patient denies ear pain  Disposition: [x] Appointment(In office)  Additional Notes: Pt states 3 days after she tested positive for COVID her left ear got clogged and has been like this for one week. Pt scheduled for appt with PCP on Wednesday. This RN educated pt on home care, new-worsening symptoms, when to call back/seek emergent care. Pt verbalized understanding and agrees to plan.   Reason for Disposition  Ear congestion present > 48 hours  Answer Assessment - Initial Assessment Questions 1. LOCATION: "Which ear is involved?"       Left ear 2. SENSATION: "Describe how the ear feels." (e.g. stuffy, full, plugged)."      Clogged 3. ONSET:  "When did the ear symptoms start?"       1 week ago 4. PAIN: "Do you also have an earache?" If Yes, ask: "How bad is it?" (Scale 1-10; or mild, moderate, severe)     Denies  Protocols used: Ear - Congestion-A-AH

## 2023-11-04 NOTE — Telephone Encounter (Signed)
 RN made a first attempt to contact the pt. Pt did not answer, RN LVM with a callback number.

## 2023-11-04 NOTE — Telephone Encounter (Signed)
 DR. Ladona Ridgel ORDERED ITAMAR STUDY.   Patient agreement reviewed and signed on 11/04/2023.  WatchPAT issued to patient on 11/04/2023 by Danielle Rankin, CMA. Patient aware to not open the WatchPAT box until contacted with the activation PIN. Patient profile initialized in CloudPAT on 11/04/2023 by Danielle Rankin, CMA. Device serial number: [id number]  Please list Reason for Call as Advice Only and type "WatchPAT issued to patient" in the comment box.

## 2023-11-06 ENCOUNTER — Ambulatory Visit (INDEPENDENT_AMBULATORY_CARE_PROVIDER_SITE_OTHER): Admitting: Family Medicine

## 2023-11-06 VITALS — BP 122/80 | HR 74 | Temp 98.0°F | Resp 18 | Ht 66.0 in | Wt 159.2 lb

## 2023-11-06 DIAGNOSIS — H9192 Unspecified hearing loss, left ear: Secondary | ICD-10-CM

## 2023-11-06 MED ORDER — PREDNISONE 20 MG PO TABS: ORAL_TABLET | ORAL | 0 refills | Status: DC

## 2023-11-06 NOTE — Patient Instructions (Signed)
 Let's try prednisone for 10 days for your ear.  Please let me know how things are going over the next few days I will go ahead and work on getting a referral to ENT going in case we need it!

## 2023-11-11 ENCOUNTER — Other Ambulatory Visit: Payer: Self-pay | Admitting: Internal Medicine

## 2023-11-11 ENCOUNTER — Other Ambulatory Visit: Payer: Self-pay | Admitting: Rheumatology

## 2023-11-11 ENCOUNTER — Other Ambulatory Visit: Payer: Self-pay | Admitting: Family Medicine

## 2023-11-11 ENCOUNTER — Encounter: Payer: Self-pay | Admitting: Family Medicine

## 2023-11-11 DIAGNOSIS — Z79899 Other long term (current) drug therapy: Secondary | ICD-10-CM

## 2023-11-11 DIAGNOSIS — H9192 Unspecified hearing loss, left ear: Secondary | ICD-10-CM

## 2023-11-11 DIAGNOSIS — J4530 Mild persistent asthma, uncomplicated: Secondary | ICD-10-CM

## 2023-11-11 DIAGNOSIS — M0579 Rheumatoid arthritis with rheumatoid factor of multiple sites without organ or systems involvement: Secondary | ICD-10-CM

## 2023-11-11 MED ORDER — BUDESONIDE-FORMOTEROL FUMARATE 160-4.5 MCG/ACT IN AERO: INHALATION_SPRAY | RESPIRATORY_TRACT | 9 refills | Status: AC

## 2023-11-11 NOTE — Telephone Encounter (Signed)
 Copied from CRM [id number]. Topic: Clinical - Medication Refill >> Nov 11, 2023  3:17 PM Pascal Lux wrote: Most Recent Primary Care Visit:  Provider: Abbe Amsterdam C  Department: [facility]  Visit Type: ACUTE  Date: 11/06/2023  Medication: budesonide-formoterol (SYMBICORT) 160-4.5 MCG/ACT inhaler [[id number]]  Has the patient contacted their pharmacy? Yes (Agent: If no, request that the patient contact the pharmacy for the refill. If patient does not wish to contact the pharmacy document the reason why and proceed with request.) (Agent: If yes, when and what did the pharmacy advise?) call provider  Is this the correct pharmacy for this prescription? Yes If no, delete pharmacy and type the correct one.  This is the patient's preferred pharmacy:  [id number] - [place], [place] - [address] [place] [address] Phone: [phone] Fax: [phone]   Has the prescription been filled recently? No  Is the patient out of the medication? Yes  Has the patient been seen for an appointment in the last year OR does the patient have an upcoming appointment? Yes  Can we respond through MyChart? Yes  Agent: Please be advised that Rx refills may take up to 3 business days. We ask that you follow-up with your pharmacy.

## 2023-11-12 ENCOUNTER — Other Ambulatory Visit: Payer: Self-pay | Admitting: *Deleted

## 2023-11-12 DIAGNOSIS — Z79899 Other long term (current) drug therapy: Secondary | ICD-10-CM

## 2023-11-13 LAB — CBC WITH DIFFERENTIAL/PLATELET
Absolute Lymphocytes: 2997 {cells}/uL (ref 850–3900)
Absolute Monocytes: 1310 {cells}/uL — ABNORMAL HIGH (ref 200–950)
Basophils Absolute: 14 {cells}/uL (ref 0–200)
Basophils Relative: 0.1 %
Eosinophils Absolute: 68 {cells}/uL (ref 15–500)
Eosinophils Relative: 0.5 %
HCT: 38.6 % (ref 35.0–45.0)
Hemoglobin: 13.2 g/dL (ref 11.7–15.5)
MCH: 32 pg (ref 27.0–33.0)
MCHC: 34.2 g/dL (ref 32.0–36.0)
MCV: 93.7 fL (ref 80.0–100.0)
MPV: 10.2 fL (ref 7.5–12.5)
Monocytes Relative: 9.7 %
Neutro Abs: 9113 {cells}/uL — ABNORMAL HIGH (ref 1500–7800)
Neutrophils Relative %: 67.5 %
Platelets: 302 10*3/uL (ref 140–400)
RBC: 4.12 10*6/uL (ref 3.80–5.10)
RDW: 12 % (ref 11.0–15.0)
Total Lymphocyte: 22.2 %
WBC: 13.5 10*3/uL — ABNORMAL HIGH (ref 3.8–10.8)

## 2023-11-13 LAB — COMPREHENSIVE METABOLIC PANEL WITH GFR
AG Ratio: 1.8 (calc) (ref 1.0–2.5)
ALT: 24 U/L (ref 6–29)
AST: 14 U/L (ref 10–35)
Albumin: 4 g/dL (ref 3.6–5.1)
Alkaline phosphatase (APISO): 57 U/L (ref 37–153)
BUN: 15 mg/dL (ref 7–25)
CO2: 30 mmol/L (ref 20–32)
Calcium: 9.6 mg/dL (ref 8.6–10.4)
Chloride: 104 mmol/L (ref 98–110)
Creat: 0.78 mg/dL (ref 0.50–1.05)
Globulin: 2.2 g/dL (ref 1.9–3.7)
Glucose, Bld: 88 mg/dL (ref 65–99)
Potassium: 4.1 mmol/L (ref 3.5–5.3)
Sodium: 143 mmol/L (ref 135–146)
Total Bilirubin: 0.5 mg/dL (ref 0.2–1.2)
Total Protein: 6.2 g/dL (ref 6.1–8.1)
eGFR: 86 mL/min/{1.73_m2} (ref >=60)

## 2023-11-13 NOTE — Progress Notes (Signed)
 CMP is normal.  WBC count is elevated most likely due to recent infection and prednisone use.  Please forward results to her PCP.

## 2023-11-15 ENCOUNTER — Encounter: Payer: Self-pay | Admitting: Internal Medicine

## 2023-11-15 ENCOUNTER — Encounter (INDEPENDENT_AMBULATORY_CARE_PROVIDER_SITE_OTHER): Payer: Self-pay | Admitting: Otolaryngology

## 2023-11-18 ENCOUNTER — Telehealth: Payer: Self-pay | Admitting: Internal Medicine

## 2023-11-18 NOTE — Telephone Encounter (Signed)
 Patient states her father has COVID and she wishes to reschedule nurse visit for next week. Nurse Visit rescheduled for 4/17 at 2:00PM.  Patient verbalized understanding and expressed appreciation for assistance.

## 2023-11-18 NOTE — Telephone Encounter (Signed)
 Pt would like a c/b to reschedule nurse visit. Please advise

## 2023-11-19 ENCOUNTER — Ambulatory Visit

## 2023-11-19 NOTE — Addendum Note (Signed)
 Addended by: Abbe Amsterdam C on: 11/19/2023 06:50 AM   Modules accepted: Orders

## 2023-11-22 ENCOUNTER — Other Ambulatory Visit: Payer: Self-pay | Admitting: Medical

## 2023-11-26 ENCOUNTER — Other Ambulatory Visit: Payer: Self-pay | Admitting: *Deleted

## 2023-11-26 DIAGNOSIS — M0579 Rheumatoid arthritis with rheumatoid factor of multiple sites without organ or systems involvement: Secondary | ICD-10-CM

## 2023-11-26 DIAGNOSIS — Z79899 Other long term (current) drug therapy: Secondary | ICD-10-CM

## 2023-11-26 MED ORDER — HYDROXYCHLOROQUINE SULFATE 200 MG PO TABS: ORAL_TABLET | ORAL | 0 refills | Status: DC

## 2023-11-26 NOTE — Telephone Encounter (Signed)
 Patient contacted the office for a refill on PLQ. Patient advised we had not refill her medication because we need her PLQ eye exam. Patient states "no one told me I needed an eye exam". Patient advised it was noted in her chart that we discussed the need for a PLQ eye exam and she had her baseline eye exam scheduled for January 2025. Patient called her eye doctor and scheduled her eye exam.  Last Fill: 08/26/2023  Eye exam: scheduled for 12/05/2023   Labs: 11/12/2023 CMP is normal.  WBC count is elevated most likely due to recent infection and prednisone use.   Next Visit: 03/04/2024  Last Visit: 10/08/2023  GL:OVFIEPPIRJ arthritis with rheumatoid factor of multiple sites without organ or systems involvemen   Current Dose per office note 10/08/2023: Plaquenil 200 mg p.o. twice daily Monday to Friday.   Okay to refill Plaquenil?

## 2023-11-28 ENCOUNTER — Ambulatory Visit: Attending: Internal Medicine | Admitting: *Deleted

## 2023-11-28 VITALS — BP 120/80 | HR 57 | Ht 66.0 in | Wt 160.0 lb

## 2023-11-28 DIAGNOSIS — I471 Supraventricular tachycardia, unspecified: Secondary | ICD-10-CM | POA: Diagnosis not present

## 2023-11-28 DIAGNOSIS — I480 Paroxysmal atrial fibrillation: Secondary | ICD-10-CM | POA: Diagnosis not present

## 2023-11-28 NOTE — Progress Notes (Signed)
   Nurse Visit   Date of Encounter: 11/28/2023 ID: Alexandra Grant, DOB [birth year], MRN [id number]  PCP:  Kaylee Partridge, MD   [facility] Providers Cardiologist:  Jann Melody, MD      Visit Details   VS:  BP 120/80   Pulse (!) 57   Ht 5' 6 (1.676 m)   Wt 160 lb (72.6 kg)   LMP 07/22/2013   BMI 25.82 kg/m  , BMI Body mass index is 25.82 kg/m.  Wt Readings from Last 3 Encounters:  11/28/23 160 lb (72.6 kg)  11/06/23 159 lb 3.2 oz (72.2 kg)  11/04/23 162 lb 3.2 oz (73.6 kg)     Reason for visit: EKG Performed today: Vitals, EKG, Provider consulted:Dr Carolynne Citron, and Education Changes (medications, testing, etc.) : no change Length of Visit: 25 minutes    Medications Adjustments/Labs and Tests Ordered: Orders Placed This Encounter  Procedures   EKG 12-Lead   No orders of the defined types were placed in this encounter.  Patient here for EKG after starting flecainide .  EKG reviewed by Dr Carolynne Citron.  No changes.  Continue current medications   Signed, Alfonso Ike, RN  11/28/2023 2:48 PM

## 2023-11-28 NOTE — Patient Instructions (Signed)
 Medication Instructions:  Your physician recommends that you continue on your current medications as directed. Please refer to the Current Medication list given to you today.  *If you need a refill on your cardiac medications before your next appointment, please call your pharmacy*  Lab Work: none If you have labs (blood work) drawn today and your tests are completely normal, you will receive your results only by: MyChart Message (if you have MyChart) OR A paper copy in the mail If you have any lab test that is abnormal or we need to change your treatment, we will call you to review the results.  Testing/Procedures: none  Follow-Up: At [facility], you and your health needs are our priority.  As part of our continuing mission to provide you with exceptional heart care, our providers are all part of one team.  This team includes your primary Cardiologist (physician) and Advanced Practice Providers or APPs (Physician Assistants and Nurse Practitioners) who all work together to provide you with the care you need, when you need it.  Your next appointment:   As planned  Provider:   Dr Carolynne Citron    We recommend signing up for the patient portal called "MyChart".  Sign up information is provided on this After Visit Summary.  MyChart is used to connect with patients for Virtual Visits (Telemedicine).  Patients are able to view lab/test results, encounter notes, upcoming appointments, etc.  Non-urgent messages can be sent to your provider as well.   To learn more about what you can do with MyChart, go to [email or web address].   Other Instructions        1st Floor: - Lobby - Registration  - Pharmacy  - Lab - Cafe  2nd Floor: - PV Lab - Diagnostic Testing (echo, CT, nuclear med)  3rd Floor: - Vacant  4th Floor: - TCTS (cardiothoracic surgery) - AFib Clinic - Structural Heart Clinic - Vascular Surgery  - Vascular Ultrasound  5th Floor: - HeartCare Cardiology  (general and EP) - Clinical Pharmacy for coumadin, hypertension, lipid, weight-loss medications, and med management appointments    Valet parking services will be available as well.

## 2023-12-17 ENCOUNTER — Telehealth: Payer: Self-pay

## 2023-12-17 NOTE — Telephone Encounter (Signed)
**Note De-Identified Alexandra Grant Obfuscation** Ordering provider: Dr Carolynne Citron Associated diagnoses: Snoring-R06.83  WatchPAT PA obtained on 12/17/2023 by Sumaiya Arruda, [name], LPN. Authorization: Per the [facility] website, a PA is not required for a [place]-HST (CPT Code: [address]).  Patient notified of PIN (1234) on 12/17/2023 [name] Notification Method: phone.  Phone note routed to covering staff for follow-up.

## 2023-12-19 ENCOUNTER — Encounter (INDEPENDENT_AMBULATORY_CARE_PROVIDER_SITE_OTHER): Payer: Self-pay | Admitting: Cardiology

## 2023-12-19 DIAGNOSIS — G4733 Obstructive sleep apnea (adult) (pediatric): Secondary | ICD-10-CM | POA: Diagnosis not present

## 2023-12-20 ENCOUNTER — Ambulatory Visit: Attending: Internal Medicine

## 2023-12-20 DIAGNOSIS — R0683 Snoring: Secondary | ICD-10-CM

## 2023-12-20 NOTE — Procedures (Signed)
   SLEEP STUDY REPORT Patient Information Study Date: 12/19/2023 Patient Name: Alexandra Grant Patient ID: [id number] Birth Date: [birth year] Age: 63 Gender: Female BMI: 26.2 (W=163 lb, H=5' 6'') Stopbang: 4 Referring Physician: Manya Sells, MD  TEST DESCRIPTION: Home sleep apnea testing was completed using the WatchPat, a Type 1 device, utilizing  peripheral arterial tonometry (PAT), chest movement, actigraphy, pulse oximetry, pulse rate, body position and snore.  AHI was calculated with apnea and hypopnea using valid sleep time as the denominator. RDI includes apneas,  hypopneas, and RERAs. The data acquired and the scoring of sleep and all associated events were performed in  accordance with the recommended standards and specifications as outlined in the AASM Manual for the Scoring of  Sleep and Associated Events 2.2.0 (2015).   FINDINGS:   1. Mild Obstructive Sleep Apnea with AHI 5.1/hr.   2. No Central Sleep Apnea with pAHIc 0.3/hr.   3. Oxygen desaturations as low as 82%.   4. Moderate to severe snoring was present. O2 sats were < 88% for 0.3 min.   5. Total sleep time was 7 hrs and 4 min.   6. 11.3% of total sleep time was spent in REM sleep.   7. Normal sleep onset latency at 13 min.   8. Prolonged REM sleep onset latency at 317 min.   9. Total awakenings were 20.  10. Arrhythmia detection: None  DIAGNOSIS: Mild Obstructive Sleep Apnea (G47.33)  RECOMMENDATIONS: 1. Clinical correlation of these findings is necessary. The decision to treat obstructive sleep apnea (OSA) is usually  based on the presence of apnea symptoms or the presence of associated medical conditions such as Hypertension,  Congestive Heart Failure, Atrial Fibrillation or Obesity. The most common symptoms of OSA are snoring, gasping for  breath while sleeping, daytime sleepiness and fatigue.  2. Initiating apnea therapy is recommended given the presence of symptoms and/or associated conditions.  Recommend  proceeding with one of the following:  a. Auto-CPAP therapy with a pressure range of 5-20cm H2O.  b. An oral appliance (OA) that can be obtained from certain dentists with expertise in sleep medicine. These are  primarily of use in non-obese patients with mild and moderate disease.  c. An ENT consultation which may be useful to look for specific causes of obstruction and possible treatment  options.  d. If patient is intolerant to PAP therapy, consider referral to ENT for evaluation for hypoglossal nerve stimulator.  3. Close follow-up is necessary to ensure success with CPAP or oral appliance therapy for maximum benefit . 4. A follow-up oximetry study on CPAP is recommended to assess the adequacy of therapy and determine the need  for supplemental oxygen or the potential need for Bi-level therapy. An arterial blood gas to determine the adequacy of  baseline ventilation and oxygenation should also be considered. 5. Healthy sleep recommendations include: adequate nightly sleep (normal 7-9 hrs/night), avoidance of caffeine after  noon and alcohol near bedtime, and maintaining a sleep environment that is cool, dark and quiet. 6. Weight loss for overweight patients is recommended. Even modest amounts of weight loss can significantly  improve the severity of sleep apnea. 7. Snoring recommendations include: weight loss where appropriate, side sleeping, and avoidance of alcohol before  bed. 8. Operation of motor vehicle should be avoided when sleepy.  Signature: Gaylyn Keas, MD; [facility]; Diplomat, American Board of Sleep  Medicine Electronically Signed: 12/20/2023 9:29:04 AM

## 2023-12-21 ENCOUNTER — Other Ambulatory Visit: Payer: Self-pay | Admitting: Rheumatology

## 2023-12-21 DIAGNOSIS — Z79899 Other long term (current) drug therapy: Secondary | ICD-10-CM

## 2023-12-21 DIAGNOSIS — M0579 Rheumatoid arthritis with rheumatoid factor of multiple sites without organ or systems involvement: Secondary | ICD-10-CM

## 2023-12-23 NOTE — Telephone Encounter (Signed)
 Last Fill: 11/26/2023 (30 day supply)   Eye exam: 12/05/2023 WNL   Labs: 11/12/2023 CMP is normal.  WBC count is elevated most likely due to recent infection and prednisone  use.    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 twice daily Monday to Friday.    Okay to refill Plaquenil ?

## 2023-12-24 ENCOUNTER — Ambulatory Visit: Payer: Self-pay | Admitting: Internal Medicine

## 2023-12-29 ENCOUNTER — Other Ambulatory Visit: Payer: Self-pay | Admitting: Family Medicine

## 2023-12-29 DIAGNOSIS — J4530 Mild persistent asthma, uncomplicated: Secondary | ICD-10-CM

## 2024-01-08 ENCOUNTER — Telehealth: Payer: Self-pay | Admitting: *Deleted

## 2024-01-08 NOTE — Telephone Encounter (Signed)
-----   Message from Alexandra Grant sent at 12/20/2023  9:30 AM EDT ----- Patient has very mild OSA - set up OV to discuss treatment options.

## 2024-01-08 NOTE — Telephone Encounter (Signed)
 The patient has been notified of the result via her mychart and telephone. Left message to call back.

## 2024-01-17 ENCOUNTER — Other Ambulatory Visit: Payer: Self-pay

## 2024-01-17 MED ORDER — DILTIAZEM HCL ER COATED BEADS 240 MG PO CP24: 240.0000 mg | ORAL_CAPSULE | Freq: Every day | ORAL | 0 refills | Status: DC

## 2024-01-20 ENCOUNTER — Institutional Professional Consult (permissible substitution) (INDEPENDENT_AMBULATORY_CARE_PROVIDER_SITE_OTHER): Admitting: Physician Assistant

## 2024-01-20 ENCOUNTER — Ambulatory Visit (INDEPENDENT_AMBULATORY_CARE_PROVIDER_SITE_OTHER): Admitting: Audiology

## 2024-01-20 NOTE — Progress Notes (Deleted)
  [address] [place], [place] [address] [phone]  Audiological Evaluation    Name: Alexandra Grant     DOB:   Sep 12, 1960      MRN:   [id number]                                                                                     Service Date: 01/20/2024     Accompanied by: ***   Patient comes today after Lorane Rocker, PA-C sent a referral for a hearing evaluation due to concerns with hearing loss.   Symptoms Yes Details  Hearing loss  []    Tinnitus  []    Ear pain/ infections/pressure  []    Balance problems  []    Noise exposure history  []    Previous ear surgeries  []    Family history of hearing loss  []    Amplification  []    Other  []      Otoscopy: Right ear: {otoscopy:31227} Left ear:  {otoscopy:31227}  Tympanometry: Right ear: {tympanometry results:31367}. Left ear: {tympanometry results:31367}.   Pure tone Audiometry: Right ear- *** {hearing loss types:31372::"sensorineural hearing loss"} from 250 Hz - 8000 Hz. Left ear-  *** {hearing loss types:31372::"sensorineural hearing loss"} from 250 Hz - 8000 Hz.  Speech Audiometry: Right ear- Speech Reception Threshold (SRT) was obtained at *** dBHL. Left ear-Speech Reception Threshold (SRT) was obtained at *** dBHL.   Word Recognition Score Tested using NU-6 ordered by difficulty (recorded) Right ear: ***% was obtained at a presentation level of *** dBHL with contralateral masking which is deemed as  {word recognition score:31373}. Left ear: ***% was obtained at a presentation level of *** dBHL with contralateral masking which is deemed as  {word recognition score:31373}.   The hearing test results were completed {transducer options:31388} and results are deemed to be of {test reliability:31390::"good reliability"}. Test technique:  conventional    Impression: {Word recognition Score interpretation:31432::"There is not a significant difference in pure-tone thresholds between ears.","There is not a  significant difference in the word recognition score in between ears. "}   Recommendations: {Audiology Recommendations:31370::"Follow up with ENT as scheduled for today."}   Lemma Tetro MARIE LEROUX-MARTINEZ, AUD

## 2024-01-31 ENCOUNTER — Ambulatory Visit: Attending: Internal Medicine | Admitting: Internal Medicine

## 2024-01-31 ENCOUNTER — Encounter: Payer: Self-pay | Admitting: Internal Medicine

## 2024-01-31 VITALS — BP 110/86 | HR 65 | Ht 66.0 in | Wt 158.0 lb

## 2024-01-31 DIAGNOSIS — I480 Paroxysmal atrial fibrillation: Secondary | ICD-10-CM

## 2024-01-31 NOTE — Progress Notes (Signed)
 HPI Alexandra Grant returns today for ongoing evaluation and management of palpitations.  She is a very pleasant 63 year old woman with a history of recurrent and documented symptomatic SVT who underwent EP study and catheter ablation over 3 years ago.  At that time, we could not induce sustained SVT.  She did have echo beats and double echo beats noted.  She had no inducible atrial tachycardia, atrial flutter, atrial fibrillation, or AV reentrant tachycardia.  A slow pathway modification was carried out successfully.  Over the past few months she has had recurrent palpitations. She has an Scientist, physiological which clearly shows PAF. She has taken cardizem  and toprol . When I saw her last she had worsening symptoms and I started twice daily low dose flecainide  50 mg twice daily. Her symptoms have resolved. She has not had to take any additional flecainide . She notes her bp is normal.  Repeat QRS was 86, unchanged from prior when she was not on flecainide .  No Known Allergies   Current Outpatient Medications  Medication Sig Dispense Refill   albuterol  (VENTOLIN  HFA) 108 (90 Base) MCG/ACT inhaler INHALE 1-2 PUFFS INTO THE LUNGS EVERY 4 HOURS AS NEEDED FOR WHEEZING OR SHORTNESS OF BREATH. 18 each 5   budesonide -formoterol  (SYMBICORT ) 160-4.5 MCG/ACT inhaler INHALE 2 PUFFS INTO THE LUNGS TWICE A DAY 10.2 each 9   CALCIUM-VITAMIN D  PO Taking 1-2 chewables by mouth daily     diltiazem  (CARDIZEM  CD) 240 MG 24 hr capsule Take 1 capsule (240 mg total) by mouth at bedtime. 90 capsule 0   diltiazem  (CARDIZEM ) 30 MG tablet TAKE 1 TABLET (30 MG TOTAL) BY MOUTH 3 (THREE) TIMES DAILY AS NEEDED. AT ONSET OF FAST HEART RATE 270 tablet 0   docusate sodium (COLACE) 100 MG capsule Take 200 mg by mouth at bedtime.     EQ ASPIRIN  ADULT LOW DOSE 81 MG EC tablet Take 1 tablet by mouth once daily 90 tablet 2   flecainide  (TAMBOCOR ) 50 MG tablet Take 1 tablet (50 mg total) by mouth 2 (two) times daily. 180 tablet 3    hydroxychloroquine  (PLAQUENIL ) 200 MG tablet TAKE ONE TABLET TWICE DAILY MONDAYS THROUGH FRIDAYS ONLY. DO NOT TAKE ON SATURDAY AND SUNDAY 40 tablet 2   meloxicam  (MOBIC ) 7.5 MG tablet TAKE 1 TABLET BY MOUTH DAILY AS NEEDED FOR PAIN 90 tablet 2   metoprolol  succinate (TOPROL  XL) 25 MG 24 hr tablet Take 1 tablet (25 mg total) by mouth daily. 90 tablet 3   fluticasone  (FLONASE ) 50 MCG/ACT nasal spray Place 2 sprays into both nostrils daily. (Patient not taking: Reported on 01/31/2024) 48 mL 1   predniSONE  (DELTASONE ) 20 MG tablet Take 60 mg by mouth daily for 5 days, then 40 mg daily for 3 days, then 20 mg daily for 2 days (Patient not taking: Reported on 01/31/2024) 25 tablet 0   No current facility-administered medications for this visit.     Past Medical History:  Diagnosis Date   Asthma    adult onset, confirmed with pre- and post-bronchodialator PFTs 06/13/2017 at PCP.   Menorrhagia 2014   Osteoporosis    Paroxysmal atrial fibrillation (HCC)    Paroxysmal SVT (supraventricular tachycardia) (HCC)     ROS:   All systems reviewed and negative except as noted in the HPI.   Past Surgical History:  Procedure Laterality Date   SVT ABLATION N/A 07/04/2020   Procedure: SVT ABLATION;  Surgeon: Tammie Fall, MD;  Location: [facility] INVASIVE CV LAB;  Service: Cardiovascular;  Laterality: N/A;     Family History  Problem Relation Age of Onset   Hypertension Mother    Heart disease Mother        stent placement   Hyperlipidemia Mother    Asthma Father    Other Father        motor vehicle accident- age 83   Healthy Brother    Diabetes Maternal Aunt    Heart disease Maternal Grandmother    Healthy Daughter    Healthy Daughter      Social History   Socioeconomic History   Marital status: Married    Spouse name: Not on file   Number of children: Not on file   Years of education: Not on file   Highest education level: Not on file  Occupational History   Occupation: Diplomatic Services operational officer     Comment: [employer] for 25 years  Tobacco Use   Smoking status: Former    Types: Cigarettes    Passive exposure: Past   Smokeless tobacco: Never   Tobacco comments:    QUIT 2015  Vaping Use   Vaping status: Never Used  Substance and Sexual Activity   Alcohol use: No   Drug use: Not Currently   Sexual activity: Yes    Partners: Male    Birth control/protection: Other-see comments    Comment: vasectomy, INTERCOURSE AGE 17SEXUAL PARTNERS LESS THAN 5  Other Topics Concern   Not on file  Social History Narrative   Married since 1985. Has 2 daughter and 2 granddaughters. Husband is retired Radiation protection practitioner.   Grew up in [place]. Her dad died before she was born. Her stepdad adopted her, had a great childhood. No h/o abuse. Dad was a used Quarry manager. Mom was an Engineer, structural.       Caffeine-none now.   Legal- none   Religious-Christian   Social Drivers of Corporate investment banker Strain: Low Risk  (10/28/2020)   Overall Financial Resource Strain (CARDIA)    Difficulty of Paying Living Expenses: Not hard at all  Food Insecurity: No Food Insecurity (10/28/2020)   Hunger Vital Sign    Worried About Running Out of Food in the Last Year: Never true    Ran Out of Food in the Last Year: Never true  Transportation Needs: No Transportation Needs (10/28/2020)   PRAPARE - Administrator, Civil Service (Medical): No    Lack of Transportation (Non-Medical): No  Physical Activity: Inactive (10/28/2020)   Exercise Vital Sign    Days of Exercise per Week: 0 days    Minutes of Exercise per Session: 0 min  Stress: Stress Concern Present (10/28/2020)   [employer] of Occupational Health - Occupational Stress Questionnaire    Feeling of Stress : Rather much  Social Connections: Moderately Integrated (10/28/2020)   Social Connection and Isolation Panel    Frequency of Communication with Friends and Family: More than three times a week    Frequency of Social Gatherings with Friends and Family:  More than three times a week    Attends Religious Services: More than 4 times per year    Active Member of [employer] or Organizations: No    Attends Banker Meetings: Never    Marital Status: Married  Catering manager Violence: Not on file     BP 110/86   Pulse 65   Ht 5' 6 (1.676 m)   Wt 158 lb (71.7 kg)   LMP 07/22/2013   SpO2 97%  BMI 25.50 kg/m   Physical Exam:  Well appearing NAD HEENT: Unremarkable Neck:  No JVD, no thyromegally Lymphatics:  No adenopathy Back:  No CVA tenderness Lungs:  Clear with no wheezes HEART:  Regular rate rhythm, no murmurs, no rubs, no clicks Abd:  soft, positive bowel sounds, no organomegally, no rebound, no guarding Ext:  2 plus pulses, no edema, no cyanosis, no clubbing Skin:  No rashes no nodules Neuro:  CN II through XII intact, motor grossly intact  Assess/Plan: PAF - she is doing well and maintaining NSR. I asked her to call us  if her heart racing worsens. When she turns 66, she will need systemic anti-coag. Palpitations - these have all resolved on flecainide . Continue beta blocker. Low bp - if this worsens she would need to wean back her dose of cardizem .  Donnald Fuss

## 2024-01-31 NOTE — Patient Instructions (Signed)
 Medication Instructions:  Your physician recommends that you continue on your current medications as directed. Please refer to the Current Medication list given to you today.  *If you need a refill on your cardiac medications before your next appointment, please call your pharmacy*  Lab Work: None ordered.  You may go to any [facility] Location for your lab work:  [facility] - [address] ([facility]) - [address] [phone] [address]  [place] - [address]   [place]  - [address]   [place] - [address] - [address] [facility]  - [address] ([facility]   If you have labs (blood work) drawn today and your tests are completely normal, you will receive your results only by: [employer] (if you have MyChart)  If you have any lab test that is abnormal or we need to change your treatment, we will call you or send a MyChart message to review the results.  Testing/Procedures: None ordered.  Follow-Up: At [facility], you and your health needs are our priority.  As part of our continuing mission to provide you with exceptional heart care, we have created designated Provider Care Teams.  These Care Teams include your primary Cardiologist (physician) and Advanced Practice Providers (APPs -  Physician Assistants and Nurse Practitioners) who all work together to provide you with the care you need, when you need it.  Your next appointment:   1 year(s)  The format for your next appointment:   In Person  Provider:   Manya Sells, MD{or one of the following Advanced Practice Providers on your designated Care Team:   Mertha Abrahams, [place] [address]" [place], [place] Neda Balk, NP

## 2024-02-19 NOTE — Progress Notes (Unsigned)
 Office Visit Note  Patient: Alexandra Grant             Date of Birth: 07/05/1961           MRN: [id number]             PCP: Watt Harlene BROCKS, MD Referring: Watt Harlene BROCKS, MD Visit Date: 03/04/2024 Occupation: @GUAROCC @  Subjective:  Medication monitoring  History of Present Illness: Cesily TELLY BROBERG is a 63 y.o. female with history of seropositive rheumatoid arthritis.  Patient is taking Plaquenil  200 mg 1 tablet by mouth twice daily Monday through Friday.  She was started on plaquenil  at the end of October 2024.  She has been tolerating Plaquenil  without any side effects and has not had any gaps in therapy.  She has noticed almost 100% improvement in her symptoms while taking Plaquenil .  Patient states that if she performs repetitive or overuse activities while performing yard work she notices some increase soreness in her wrist but has not had any recent flares.  She denies any joint swelling at this time.  She denies any morning stiffness or nocturnal pain.  She has not had any difficulty performing ADLs.  She takes meloxicam  sparingly if she has moderate to severe pain.  She has not needed to take prednisone  recently.     Activities of Daily Living:  Patient denies any morning stiffness  Patient Denies nocturnal pain.  Difficulty dressing/grooming: Denies Difficulty climbing stairs: Denies Difficulty getting out of chair: Denies Difficulty using hands for taps, buttons, cutlery, and/or writing: Denies  Review of Systems  Constitutional:  Negative for fatigue.  HENT:  Negative for mouth sores and mouth dryness.   Eyes:  Negative for dryness.  Respiratory:  Negative for shortness of breath.   Cardiovascular:  Negative for chest pain and palpitations.  Gastrointestinal:  Negative for blood in stool, constipation and diarrhea.  Endocrine: Negative for increased urination.  Genitourinary:  Negative for involuntary urination.  Musculoskeletal:  Positive for joint pain, joint  pain and joint swelling. Negative for gait problem, myalgias, muscle weakness, morning stiffness, muscle tenderness and myalgias.  Skin:  Negative for color change, rash, hair loss and sensitivity to sunlight.  Allergic/Immunologic: Negative for susceptible to infections.  Neurological:  Negative for dizziness and headaches.  Hematological:  Negative for swollen glands.  Psychiatric/Behavioral:  Positive for sleep disturbance. Negative for depressed mood. The patient is not nervous/anxious.     PMFS History:  Patient Active Problem List   Diagnosis Date Noted   Rheumatoid arthritis with rheumatoid factor of multiple sites without organ or systems involvement (HCC) 10/08/2023   Colon cancer screening 09/21/2022   Gastroesophageal reflux disease 09/21/2022   Mixed hyperlipidemia 08/23/2021   Elevated blood pressure reading 05/22/2021   Pruritus of vulva 04/21/2021   Insomnia 09/15/2020   Family history of osteoporosis 07/30/2019   Increased body mass index 07/30/2019   Anxiety 07/13/2019   Vitamin D  deficiency 07/13/2019   Paroxysmal supraventricular tachycardia (HCC) 09/11/2017   Paroxysmal atrial fibrillation (HCC) 09/11/2017   Asthma in adult, moderate persistent, uncomplicated 06/13/2017   Osteoporosis without current pathological fracture 12/24/2016   DUB (dysfunctional uterine bleeding) 04/27/2013    Past Medical History:  Diagnosis Date   Asthma    adult onset, confirmed with pre- and post-bronchodialator PFTs 06/13/2017 at PCP.   Menorrhagia 2014   Osteoporosis    Paroxysmal atrial fibrillation (HCC)    Paroxysmal SVT (supraventricular tachycardia) (HCC)     Family History  Problem  Relation Age of Onset   Hypertension Mother    Heart disease Mother        stent placement   Hyperlipidemia Mother    Asthma Father    Other Father        motor vehicle accident- age 33   Healthy Brother    Diabetes Maternal Aunt    Heart disease Maternal Grandmother    Healthy  Daughter    Healthy Daughter    Past Surgical History:  Procedure Laterality Date   SVT ABLATION N/A 07/04/2020   Procedure: SVT ABLATION;  Surgeon: Waddell Danelle ORN, MD;  Location: [facility];  Service: Cardiovascular;  Laterality: N/A;   Social History   Social History Narrative   Married since 1985. Has 2 daughter and 2 granddaughters. Husband is retired Radiation protection practitioner.   Grew up in [place]. Her dad died before she was born. Her stepdad adopted her, had a great childhood. No h/o abuse. Dad was a used Quarry manager. Mom was an Engineer, structural.       Caffeine-none now.   Legal- none   Religious-Christian   Immunization History  Administered Date(s) Administered   Influenza Inj Mdck Quad With Preservative 05/14/2019   Influenza Split 06/27/2020   Influenza,inj,Quad PF,6+ Mos 04/23/2014, 04/10/2016, 05/15/2021   Influenza-Unspecified 04/23/2014, 04/10/2016, 04/13/2021   Moderna Sars-Covid-2 Vaccination 11/09/2019, 12/07/2019   Pfizer(Comirnaty)Fall Seasonal Vaccine 12 years and older 02/03/1961   Tdap 10/12/2015   Zoster Recombinant(Shingrix) 01/25/2020, 05/15/2021     Objective: Vital Signs: BP 107/71 (BP Location: Left Arm, Patient Position: Sitting, Cuff Size: Normal)   Pulse (!) 55   Resp 16   Ht 5' 6 (1.676 m)   Wt 160 lb (72.6 kg)   LMP 07/22/2013   BMI 25.82 kg/m    Physical Exam Vitals and nursing note reviewed.  Constitutional:      Appearance: She is well-developed.  HENT:     Head: Normocephalic and atraumatic.  Eyes:     Conjunctiva/sclera: Conjunctivae normal.  Cardiovascular:     Rate and Rhythm: Normal rate and regular rhythm.     Heart sounds: Normal heart sounds.  Pulmonary:     Effort: Pulmonary effort is normal.     Breath sounds: Normal breath sounds.  Abdominal:     General: Bowel sounds are normal.     Palpations: Abdomen is soft.  Musculoskeletal:     Cervical back: Normal range of motion.  Lymphadenopathy:     Cervical: No cervical  adenopathy.  Skin:    General: Skin is warm and dry.     Capillary Refill: Capillary refill takes less than 2 seconds.  Neurological:     Mental Status: She is alert and oriented to person, place, and time.  Psychiatric:        Behavior: Behavior normal.      Musculoskeletal Exam: C-spine, thoracic spine, lumbar spine good range of motion.  Shoulder joints, elbow joints, wrist joints, MCPs, PIPs, DIPs have good range of motion with no synovitis.  Complete fist formation bilaterally.  Mild PIP and DIP thickening noted.  Hip joints have good range of motion with no groin pain.  Knee joints have good range of motion no warmth or effusion.  Ankle joints have good range of motion with no tenderness or synovitis.  No tenderness or synovitis over MTP joints.  Bunion formation over the right first MTP.  CDAI Exam: CDAI Score: -- Patient Global: --; Provider Global: -- Swollen: --; Tender: -- Joint Exam  03/04/2024   No joint exam has been documented for this visit   There is currently no information documented on the homunculus. Go to the Rheumatology activity and complete the homunculus joint exam.  Investigation: No additional findings.  Imaging: No results found.  Recent Labs: Lab Results  Component Value Date   WBC 13.5 (H) 11/12/2023   HGB 13.2 11/12/2023   PLT 302 11/12/2023   NA 143 11/12/2023   K 4.1 11/12/2023   CL 104 11/12/2023   CO2 30 11/12/2023   GLUCOSE 88 11/12/2023   BUN 15 11/12/2023   CREATININE 0.78 11/12/2023   BILITOT 0.5 11/12/2023   ALKPHOS 80 11/21/2022   AST 14 11/12/2023   ALT 24 11/12/2023   PROT 6.2 11/12/2023   ALBUMIN 4.6 11/21/2022   CALCIUM 9.6 11/12/2023   GFRAA 86 07/01/2020   QFTBGOLDPLUS NEGATIVE 06/11/2023    Speciality Comments: Plaquenil  June 11, 2023  PLQ Eye Exam 12/05/2023 WNL @ [facility] follow up 1 year  Procedures:  No procedures performed Allergies: Patient has no known allergies.   Assessment / Plan:     Visit  Diagnoses: Rheumatoid arthritis with rheumatoid factor of multiple sites without organ or systems involvement (HCC) - + RF, + anti-CCP, - ANA: She has no tenderness or synovitis on exam.  She has not any signs or symptoms of a rheumatoid arthritis flare.  No morning stiffness, nocturnal pain, or difficulty performing ADLs.  She has noticed almost 100% improvement in her symptoms since initiating Plaquenil .  She has clinically been doing well taking Plaquenil  2 mg 1 tablet by mouth twice daily Monday through Friday.  She is tolerating Plaquenil  without any side effects and has not had any gaps in therapy.  She has been taken meloxicam  sparingly for moderate to severe pain relief.  She has not required prednisone  recently. No medication changes will be made at this time.  She was advised to notify us  if she develops any signs or symptoms of a flare.  She will follow up in 5 months or sooner if needed.    High risk medication use - Plaquenil  200 mg 1 tablet by mouth twice daily Monday to Friday.  PLQ Eye Exam 12/05/2023 WNL @ [facility] follow up 1 year.   CBC and CMP updated on 11/12/2023.  Orders for CBC and CMP released today. She will continue to require updated lab work in 5 months.  - Plan: CBC with Differential/Platelet, Comprehensive metabolic panel with GFR  Chronic pain of both shoulders: X-rays of both shoulders were unremarkable on 06/11/2023.  She has good range of motion of both shoulder joints on examination today.  No tenderness upon palpation.  Primary osteoarthritis of both hands: X-rays of both hands were consistent with osteoarthritis.  No erosive changes.  No synovitis noted.  Primary osteoarthritis of both feet: X-rays of both feet were consistent with osteoarthritis.  No recent changes were noted.  She has no synovitis on examination today.  Sicca syndrome (HCC) - Dry mouth.  SSA negative, SSB negative. Improved since starting plaquenil .   Age-related osteoporosis without current  pathological fracture -10/25/2022 DEXA scan left femoral neck T-score -2.6, BMD 0.670. Patient declined treatment. She is taking vitamin D  2000 units daily.   Vitamin D  deficiency: She is taking vitamin D  2000 units daily.  Other medical conditions are listed as follows:   Paroxysmal atrial fibrillation (HCC)  Paroxysmal supraventricular tachycardia (HCC)  Mixed hyperlipidemia: Lipid panel updated on 06/25/23.  Primary insomnia  Gastroesophageal reflux disease without esophagitis  Anxiety  Former smoker  Family history of osteoporosis  Orders: Orders Placed This Encounter  Procedures   CBC with Differential/Platelet   Comprehensive metabolic panel with GFR   No orders of the defined types were placed in this encounter.   Follow-Up Instructions: Return in about 5 months (around 08/04/2024) for Rheumatoid arthritis.   Waddell CHRISTELLA Craze, PA-C  Note - This record has been created using Dragon software.  Chart creation errors have been sought, but may not always  have been located. Such creation errors do not reflect on  the standard of medical care.

## 2024-03-02 ENCOUNTER — Other Ambulatory Visit (INDEPENDENT_AMBULATORY_CARE_PROVIDER_SITE_OTHER)

## 2024-03-02 ENCOUNTER — Encounter (INDEPENDENT_AMBULATORY_CARE_PROVIDER_SITE_OTHER): Payer: Self-pay | Admitting: Family Medicine

## 2024-03-02 DIAGNOSIS — R300 Dysuria: Secondary | ICD-10-CM

## 2024-03-02 LAB — POC URINALSYSI DIPSTICK (AUTOMATED)
Bilirubin, UA: NEGATIVE
Blood, UA: NEGATIVE
Glucose, UA: NEGATIVE
Ketones, UA: NEGATIVE
Leukocytes, UA: NEGATIVE
Nitrite, UA: NEGATIVE
Protein, UA: NEGATIVE
Spec Grav, UA: 1.01 (ref 1.010–1.025)
Urobilinogen, UA: 0.2 U/dL
pH, UA: 5 (ref 5.0–8.0)

## 2024-03-02 MED ORDER — NITROFURANTOIN MONOHYD MACRO 100 MG PO CAPS: 100.0000 mg | ORAL_CAPSULE | Freq: Two times a day (BID) | ORAL | 0 refills | Status: AC

## 2024-03-02 NOTE — Telephone Encounter (Signed)

## 2024-03-02 NOTE — Addendum Note (Signed)
 Addended by: WELLS LEVORN HERO on: 03/02/2024 01:41 PM   Modules accepted: Orders

## 2024-03-03 LAB — URINE CULTURE
MICRO NUMBER:: 16723819
SPECIMEN QUALITY:: ADEQUATE

## 2024-03-04 ENCOUNTER — Ambulatory Visit: Payer: 59 | Attending: Physician Assistant | Admitting: Physician Assistant

## 2024-03-04 ENCOUNTER — Encounter: Payer: Self-pay | Admitting: Physician Assistant

## 2024-03-04 VITALS — BP 107/71 | HR 55 | Resp 16 | Ht 66.0 in | Wt 160.0 lb

## 2024-03-04 DIAGNOSIS — M810 Age-related osteoporosis without current pathological fracture: Secondary | ICD-10-CM

## 2024-03-04 DIAGNOSIS — M79641 Pain in right hand: Secondary | ICD-10-CM

## 2024-03-04 DIAGNOSIS — E782 Mixed hyperlipidemia: Secondary | ICD-10-CM

## 2024-03-04 DIAGNOSIS — G8929 Other chronic pain: Secondary | ICD-10-CM

## 2024-03-04 DIAGNOSIS — F5101 Primary insomnia: Secondary | ICD-10-CM

## 2024-03-04 DIAGNOSIS — M25511 Pain in right shoulder: Secondary | ICD-10-CM | POA: Diagnosis not present

## 2024-03-04 DIAGNOSIS — Z8262 Family history of osteoporosis: Secondary | ICD-10-CM

## 2024-03-04 DIAGNOSIS — M25512 Pain in left shoulder: Secondary | ICD-10-CM

## 2024-03-04 DIAGNOSIS — M0579 Rheumatoid arthritis with rheumatoid factor of multiple sites without organ or systems involvement: Secondary | ICD-10-CM

## 2024-03-04 DIAGNOSIS — M19041 Primary osteoarthritis, right hand: Secondary | ICD-10-CM

## 2024-03-04 DIAGNOSIS — Z79899 Other long term (current) drug therapy: Secondary | ICD-10-CM

## 2024-03-04 DIAGNOSIS — M3500 Sicca syndrome, unspecified: Secondary | ICD-10-CM

## 2024-03-04 DIAGNOSIS — F419 Anxiety disorder, unspecified: Secondary | ICD-10-CM

## 2024-03-04 DIAGNOSIS — K219 Gastro-esophageal reflux disease without esophagitis: Secondary | ICD-10-CM

## 2024-03-04 DIAGNOSIS — M19042 Primary osteoarthritis, left hand: Secondary | ICD-10-CM

## 2024-03-04 DIAGNOSIS — I471 Supraventricular tachycardia, unspecified: Secondary | ICD-10-CM

## 2024-03-04 DIAGNOSIS — M79671 Pain in right foot: Secondary | ICD-10-CM

## 2024-03-04 DIAGNOSIS — M79672 Pain in left foot: Secondary | ICD-10-CM

## 2024-03-04 DIAGNOSIS — E559 Vitamin D deficiency, unspecified: Secondary | ICD-10-CM

## 2024-03-04 DIAGNOSIS — I480 Paroxysmal atrial fibrillation: Secondary | ICD-10-CM

## 2024-03-04 DIAGNOSIS — Z87891 Personal history of nicotine dependence: Secondary | ICD-10-CM

## 2024-03-04 LAB — COMPREHENSIVE METABOLIC PANEL WITH GFR
AG Ratio: 2 (calc) (ref 1.0–2.5)
ALT: 15 U/L (ref 6–29)
AST: 17 U/L (ref 10–35)
Albumin: 4.3 g/dL (ref 3.6–5.1)
Alkaline phosphatase (APISO): 74 U/L (ref 37–153)
BUN: 13 mg/dL (ref 7–25)
CO2: 28 mmol/L (ref 20–32)
Calcium: 9.7 mg/dL (ref 8.6–10.4)
Chloride: 106 mmol/L (ref 98–110)
Creat: 0.78 mg/dL (ref 0.50–1.05)
Globulin: 2.1 g/dL (ref 1.9–3.7)
Glucose, Bld: 96 mg/dL (ref 65–99)
Potassium: 4 mmol/L (ref 3.5–5.3)
Sodium: 141 mmol/L (ref 135–146)
Total Bilirubin: 0.6 mg/dL (ref 0.2–1.2)
Total Protein: 6.4 g/dL (ref 6.1–8.1)
eGFR: 86 mL/min/1.73m2 (ref >=60)

## 2024-03-04 LAB — CBC WITH DIFFERENTIAL/PLATELET
Absolute Lymphocytes: 1442 {cells}/uL (ref 850–3900)
Absolute Monocytes: 529 {cells}/uL (ref 200–950)
Basophils Absolute: 49 {cells}/uL (ref 0–200)
Basophils Relative: 0.9 %
Eosinophils Absolute: 130 {cells}/uL (ref 15–500)
Eosinophils Relative: 2.4 %
HCT: 41.7 % (ref 35.0–45.0)
Hemoglobin: 14 g/dL (ref 11.7–15.5)
MCH: 32.1 pg (ref 27.0–33.0)
MCHC: 33.6 g/dL (ref 32.0–36.0)
MCV: 95.6 fL (ref 80.0–100.0)
MPV: 9.7 fL (ref 7.5–12.5)
Monocytes Relative: 9.8 %
Neutro Abs: 3251 {cells}/uL (ref 1500–7800)
Neutrophils Relative %: 60.2 %
Platelets: 269 Thousand/uL (ref 140–400)
RBC: 4.36 Million/uL (ref 3.80–5.10)
RDW: 11.9 % (ref 11.0–15.0)
Total Lymphocyte: 26.7 %
WBC: 5.4 Thousand/uL (ref 3.8–10.8)

## 2024-03-05 ENCOUNTER — Ambulatory Visit: Payer: Self-pay | Admitting: Physician Assistant

## 2024-03-05 NOTE — Progress Notes (Signed)
 CBC and CMP WNL

## 2024-03-07 ENCOUNTER — Other Ambulatory Visit: Payer: Self-pay | Admitting: Rheumatology

## 2024-03-07 DIAGNOSIS — Z79899 Other long term (current) drug therapy: Secondary | ICD-10-CM

## 2024-03-07 DIAGNOSIS — M0579 Rheumatoid arthritis with rheumatoid factor of multiple sites without organ or systems involvement: Secondary | ICD-10-CM

## 2024-03-09 NOTE — Telephone Encounter (Signed)
 Last Fill: 12/23/2023  Eye exam: 12/05/2023 WNL    Labs: 03/04/2024 CBC and CMP WNL   Next Visit: 07/27/2024  Last Visit: 03/04/2024  DX: Rheumatoid arthritis with rheumatoid factor of multiple sites without organ or systems involvement   Current Dose per office note 03/04/2024: Plaquenil  200 mg 1 tablet by mouth twice daily Monday to Friday.   Okay to refill Plaquenil ?

## 2024-03-20 ENCOUNTER — Ambulatory Visit (INDEPENDENT_AMBULATORY_CARE_PROVIDER_SITE_OTHER): Admitting: Physician Assistant

## 2024-03-20 ENCOUNTER — Ambulatory Visit (INDEPENDENT_AMBULATORY_CARE_PROVIDER_SITE_OTHER): Admitting: Audiology

## 2024-03-20 ENCOUNTER — Encounter (INDEPENDENT_AMBULATORY_CARE_PROVIDER_SITE_OTHER): Payer: Self-pay | Admitting: Physician Assistant

## 2024-03-20 VITALS — BP 120/79 | HR 52

## 2024-03-20 DIAGNOSIS — H9392 Unspecified disorder of left ear: Secondary | ICD-10-CM

## 2024-03-20 DIAGNOSIS — H903 Sensorineural hearing loss, bilateral: Secondary | ICD-10-CM | POA: Diagnosis not present

## 2024-03-20 DIAGNOSIS — H6992 Unspecified Eustachian tube disorder, left ear: Secondary | ICD-10-CM

## 2024-03-20 NOTE — Progress Notes (Addendum)
  [address], [address] [place], [place] [address] [phone]  Audiological Evaluation    Name: Alexandra Grant     DOB:   1961-03-20      MRN:   [id number]                                                                                     Service Date: 03/20/2024     Accompanied by: husband   Patient comes today after Reyes Cohen, PA-C sent a referral for a hearing evaluation due to concerns with hearing loss.   Symptoms Yes Details  Hearing loss  [x]  Perceived muffled hearing in the left ear since she had COVID 02 October 2023  Tinnitus  []    Ear pain/ infections/pressure  [x]  Pain went away  Balance problems  []    Noise exposure history  []    Previous ear surgeries  []    Family history of hearing loss  []    Amplification  []    Other  []      Otoscopy: Right ear: Clear external ear canal and notable landmarks visualized on the tympanic membrane. Left ear:  Clear external ear canal and notable landmarks visualized on the tympanic membrane.  Tympanometry: Right ear: Type A- Normal external ear canal volume with normal middle ear pressure and tympanic membrane compliance. Left ear: Type A- Normal external ear canal volume with normal middle ear pressure and tympanic membrane compliance.   Pure tone Audiometry: Right ear- Normal hearing from 125- 4000 Hz, then mild presumably sensorineural hearing loss from 6000 Hz - 8000 Hz. Left ear-  Normal hearing from 125- 6000 Hz, then mild presumably sensorineural hearing loss at  8000 Hz.  Speech Audiometry: Right ear- Speech Reception Threshold (SRT) was obtained at 5 dBHL. Left ear-Speech Reception Threshold (SRT) was obtained at 5 dBHL.   Word Recognition Score Tested using NU-6 (recorded) Right ear: 100% was obtained at a presentation level of 55 dBHL with contralateral masking which is deemed as  excellent. Left ear: 100% was obtained at a presentation level of 55 dBHL with contralateral masking which is deemed as   excellent.   The hearing test results were completed under headphones and results are deemed to be of good reliability. Test technique:  conventional    Recommendations: Follow up with ENT as scheduled for today. Return for a hearing evaluation in 2 years, before if concerns with hearing changes arise or per MD recommendation.   Vibhav Waddill MARIE LEROUX-MARTINEZ, AUD

## 2024-03-20 NOTE — Progress Notes (Signed)
 Dear Dr. Watt, Here is my assessment for our mutual patient, Santiana Glidden. Thank you for allowing me the opportunity to care for your patient. Please do not hesitate to contact me should you have any other questions. Sincerely, Chyrl Cohen PA-C  Otolaryngology Clinic Note Referring provider: Dr. Watt HPI:  Alexandra Grant is a 63 y.o. female kindly referred by Dr. Watt   The patient presents today for evaluation of left-sided ear fullness.  The patient notes that in March 2025 approximately 5 months ago she was diagnosed with COVID.  She notes she developed left-sided earache.  She was seen by her primary care and given amoxicillin .  She notes the pain went away but she had persistent fullness and decreased hearing on the left ear.  She notes around the same time she was having clicking and popping in the ear.  She also notes some ringing in the left ear at that time.  She notes since then she has had no episodes of ringing, no dizziness, no pain, she notes a persistent fullness in the left ear.  She denies any history of the same, no history recurrent ear infections.  No history of head or neck surgery.  No significant allergy symptoms.   Independent Review of Additional Tests or Records:  PCP office note on November 06, 2023  Audiological evaluation 03/20/2024   PMH/Meds/All/SocHx/FamHx/ROS:   Past Medical History:  Diagnosis Date   Asthma    adult onset, confirmed with pre- and post-bronchodialator PFTs 06/13/2017 at PCP.   Menorrhagia 2014   Osteoporosis    Paroxysmal atrial fibrillation (HCC)    Paroxysmal SVT (supraventricular tachycardia) (HCC)      Past Surgical History:  Procedure Laterality Date   SVT ABLATION N/A 07/04/2020   Procedure: SVT ABLATION;  Surgeon: Waddell Danelle ORN, MD;  Location: [facility];  Service: Cardiovascular;  Laterality: N/A;    Family History  Problem Relation Age of Onset   Hypertension Mother    Heart disease Mother        stent  placement   Hyperlipidemia Mother    Asthma Father    Other Father        motor vehicle accident- age 59   Healthy Brother    Diabetes Maternal Aunt    Heart disease Maternal Grandmother    Healthy Daughter    Healthy Daughter      Social Connections: Moderately Integrated (10/28/2020)   Social Connection and Isolation Panel    Frequency of Communication with Friends and Family: More than three times a week    Frequency of Social Gatherings with Friends and Family: More than three times a week    Attends Religious Services: More than 4 times per year    Active Member of [employer] or Organizations: No    Attends Engineer, structural: Never    Marital Status: Married      Current Outpatient Medications:    albuterol  (VENTOLIN  HFA) 108 (90 Base) MCG/ACT inhaler, INHALE 1-2 PUFFS INTO THE LUNGS EVERY 4 HOURS AS NEEDED FOR WHEEZING OR SHORTNESS OF BREATH., Disp: 18 each, Rfl: 5   budesonide -formoterol  (SYMBICORT ) 160-4.5 MCG/ACT inhaler, INHALE 2 PUFFS INTO THE LUNGS TWICE A DAY, Disp: 10.2 each, Rfl: 9   CALCIUM-VITAMIN D  PO, Taking 1-2 chewables by mouth daily, Disp: , Rfl:    diltiazem  (CARDIZEM  CD) 240 MG 24 hr capsule, Take 1 capsule (240 mg total) by mouth at bedtime., Disp: 90 capsule, Rfl: 0   diltiazem  (CARDIZEM ) 30  MG tablet, TAKE 1 TABLET (30 MG TOTAL) BY MOUTH 3 (THREE) TIMES DAILY AS NEEDED. AT ONSET OF FAST HEART RATE, Disp: 270 tablet, Rfl: 0   diltiazem  (TIAZAC ) 240 MG 24 hr capsule, 1 capsule., Disp: , Rfl:    docusate sodium (COLACE) 100 MG capsule, Take 200 mg by mouth at bedtime., Disp: , Rfl:    EQ ASPIRIN  ADULT LOW DOSE 81 MG EC tablet, Take 1 tablet by mouth once daily, Disp: 90 tablet, Rfl: 2   flecainide  (TAMBOCOR ) 50 MG tablet, Take 1 tablet (50 mg total) by mouth 2 (two) times daily., Disp: 180 tablet, Rfl: 3   fluticasone  (FLONASE ) 50 MCG/ACT nasal spray, Place 2 sprays into both nostrils daily. (Patient not taking: Reported on 03/04/2024), Disp: 48 mL, Rfl:  1   hydroxychloroquine  (PLAQUENIL ) 200 MG tablet, TAKE ONE TABLET TWICE DAILY MONDAYS THROUGH FRIDAYS ONLY. DO NOT TAKE ON SATURDAY AND SUNDAY, Disp: 40 tablet, Rfl: 2   meloxicam  (MOBIC ) 7.5 MG tablet, TAKE 1 TABLET BY MOUTH DAILY AS NEEDED FOR PAIN, Disp: 90 tablet, Rfl: 2   metoprolol  succinate (TOPROL  XL) 25 MG 24 hr tablet, Take 1 tablet (25 mg total) by mouth daily., Disp: 90 tablet, Rfl: 3   nitrofurantoin , macrocrystal-monohydrate, (MACROBID ) 100 MG capsule, Take 1 capsule (100 mg total) by mouth 2 (two) times daily., Disp: 14 capsule, Rfl: 0   predniSONE  (DELTASONE ) 20 MG tablet, Take 60 mg by mouth daily for 5 days, then 40 mg daily for 3 days, then 20 mg daily for 2 days (Patient not taking: Reported on 03/04/2024), Disp: 25 tablet, Rfl: 0   Physical Exam:   LMP 07/22/2013   Pertinent Findings  CN II-XII intact Bilateral EAC clear and TM intact with well pneumatized middle ear spaces Anterior rhinoscopy: Septum midline; bilateral inferior turbinates with no hypertrophy No lesions of oral cavity/oropharynx; dentition within normal limits No obviously palpable neck masses/lymphadenopathy/thyromegaly No respiratory distress or stridor  Seprately Identifiable Procedures:  None  Impression & Plans:  Alexandra Grant is a 63 y.o. female with the following   Left ear fullness-  63 year old female seen in our office for evaluation of left ear fullness.  Symptoms are most consistent with eustachian tube dysfunction.  Question if she did have otitis media.  In any evident today she is doing well, her hearing is symmetric on audiological evaluation, she has type a tympanometry.  Physical exam reassuring today.  Given she is doing much better, no indication for ongoing management.  If she continues to have symptoms for another 3 months I like to see her back in the office, she may follow-up sooner as needed.  Patient verbalized understanding and agreement to today's plan had no further questions  or concerns.   - f/u PRN   Thank you for allowing me the opportunity to care for your patient. Please do not hesitate to contact me should you have any other questions.  Sincerely, Chyrl Cohen PA-C [facility] Phone: [phone] Fax: [phone]  03/20/2024, 12:58 PM

## 2024-03-26 ENCOUNTER — Other Ambulatory Visit: Payer: Self-pay | Admitting: Family Medicine

## 2024-03-26 DIAGNOSIS — M2550 Pain in unspecified joint: Secondary | ICD-10-CM

## 2024-03-31 ENCOUNTER — Encounter: Payer: Self-pay | Admitting: Audiology

## 2024-06-09 ENCOUNTER — Other Ambulatory Visit: Payer: Self-pay | Admitting: Physician Assistant

## 2024-06-09 DIAGNOSIS — Z79899 Other long term (current) drug therapy: Secondary | ICD-10-CM

## 2024-06-09 DIAGNOSIS — M0579 Rheumatoid arthritis with rheumatoid factor of multiple sites without organ or systems involvement: Secondary | ICD-10-CM

## 2024-06-09 NOTE — Telephone Encounter (Signed)
 Last Fill: 03/09/2024  Eye exam: 12/05/2023   Labs: 03/04/2024 CBC and CMP WNL   Next Visit: 07/27/2024  Last Visit: 03/04/2024  IK:Myzlfjunpi arthritis with rheumatoid factor of multiple sites without organ or systems involvement   Current Dose per office note on 03/04/2024:  Plaquenil  200 mg 1 tablet by mouth twice daily Monday to Friday   Okay to refill Plaquenil ?

## 2024-06-15 ENCOUNTER — Encounter: Payer: Self-pay | Admitting: Rheumatology

## 2024-06-15 ENCOUNTER — Other Ambulatory Visit: Payer: Self-pay | Admitting: Medical Genetics

## 2024-06-15 NOTE — Telephone Encounter (Signed)
 She can be scheduled for sooner office visit for evaluation and management if she would like to.  We can update x-rays of the shoulder as well as perform a cortisone injection if needed. I do not recommend exceeding the use of meloxicam  15 mg once daily.

## 2024-06-17 NOTE — Progress Notes (Unsigned)
 Office Visit Note  Patient: Alexandra Grant             Date of Birth: [birth year]           MRN: [id number]             PCP: Watt Harlene BROCKS, MD Referring: Watt Harlene BROCKS, MD Visit Date: 06/18/2024 Occupation: Data Unavailable  Subjective:  Right arm pain   History of Present Illness: Alexandra Grant is a 63 y.o. female with seropositive rheumatoid arthritis.  She states for the last 1 month she has been having pain and discomfort in her right arm which she describes over bicipital tendon.  Patient states that she is very active with yard work.  She has been using a weight greater and leaf blower but it is light weight.  She had recent episode of some discomfort in her hands which resolved after taking meloxicam .  Nebulizer joints are painful.  She denies any morning stiffness.    Activities of Daily Living:  Patient reports morning stiffness for 0 minutes.   Patient Reports nocturnal pain.  Difficulty dressing/grooming: Reports Difficulty climbing stairs: Denies Difficulty getting out of chair: Denies Difficulty using hands for taps, buttons, cutlery, and/or writing: Denies  Review of Systems  Constitutional:  Positive for fatigue.  HENT:  Negative for mouth sores and mouth dryness.   Eyes:  Negative for dryness.  Respiratory:  Negative for shortness of breath.   Cardiovascular:  Negative for chest pain and palpitations.  Gastrointestinal:  Negative for blood in stool, constipation and diarrhea.  Endocrine: Negative for increased urination.  Genitourinary:  Negative for involuntary urination.  Musculoskeletal:  Positive for joint pain, joint pain, joint swelling, myalgias, muscle tenderness and myalgias. Negative for gait problem, muscle weakness and morning stiffness.  Skin:  Negative for color change, rash, hair loss and sensitivity to sunlight.  Allergic/Immunologic: Negative for susceptible to infections.  Neurological:  Negative for dizziness and headaches.   Hematological:  Negative for swollen glands.  Psychiatric/Behavioral:  Positive for sleep disturbance. Negative for depressed mood. The patient is not nervous/anxious.     PMFS History:  Patient Active Problem List   Diagnosis Date Noted   Rheumatoid arthritis with rheumatoid factor of multiple sites without organ or systems involvement (HCC) 10/08/2023   Colon cancer screening 09/21/2022   Gastroesophageal reflux disease 09/21/2022   Mixed hyperlipidemia 08/23/2021   Elevated blood pressure reading 05/22/2021   Pruritus of vulva 04/21/2021   Insomnia 09/15/2020   Family history of osteoporosis 07/30/2019   Increased body mass index 07/30/2019   Anxiety 07/13/2019   Vitamin D  deficiency 07/13/2019   Paroxysmal supraventricular tachycardia 09/11/2017   Paroxysmal atrial fibrillation (HCC) 09/11/2017   Asthma in adult, moderate persistent, uncomplicated 06/13/2017   Osteoporosis without current pathological fracture 12/24/2016   DUB (dysfunctional uterine bleeding) 04/27/2013    Past Medical History:  Diagnosis Date   Asthma    adult onset, confirmed with pre- and post-bronchodialator PFTs 06/13/2017 at PCP.   Menorrhagia 2014   Osteoporosis    Paroxysmal atrial fibrillation (HCC)    Paroxysmal SVT (supraventricular tachycardia)     Family History  Problem Relation Age of Onset   Hypertension Mother    Heart disease Mother        stent placement   Hyperlipidemia Mother    Asthma Father    Other Father        motor vehicle accident- age 16   Healthy Brother    Diabetes  Maternal Aunt    Heart disease Maternal Grandmother    Healthy Daughter    Healthy Daughter    Past Surgical History:  Procedure Laterality Date   SVT ABLATION N/A 07/04/2020   Procedure: SVT ABLATION;  Surgeon: Waddell Danelle ORN, MD;  Location: [facility];  Service: Cardiovascular;  Laterality: N/A;   Social History   Tobacco Use   Smoking status: Former    Types: Cigarettes    Passive  exposure: Past   Smokeless tobacco: Never   Tobacco comments:    QUIT 2015  Vaping Use   Vaping status: Never Used  Substance Use Topics   Alcohol use: No   Drug use: Not Currently   Social History   Social History Narrative   Married since 1985. Has 2 daughter and 2 granddaughters. Husband is retired radiation protection practitioner.   Grew up in [place]. Her dad died before she was born. Her stepdad adopted her, had a great childhood. No h/o abuse. Dad was a used quarry manager. Mom was an engineer, structural.       Caffeine-none now.   Legal- none   Religious-Christian     Immunization History  Administered Date(s) Administered   Influenza Inj Mdck Quad With Preservative 05/14/2019   Influenza Split 06/27/2020   Influenza,inj,Quad PF,6+ Mos 04/23/2014, 04/10/2016, 05/15/2021   Influenza-Unspecified 04/23/2014, 04/10/2016, 04/13/2021   Moderna Sars-Covid-2 Vaccination 11/09/2019, 12/07/2019   Pfizer(Comirnaty)Fall Seasonal Vaccine 12 years and older 1961/06/15   Tdap 10/12/2015   Zoster Recombinant(Shingrix) 01/25/2020, 05/15/2021     Objective: Vital Signs: BP 105/72   Pulse 87   Temp 98.4 F (36.9 C)   Resp 14   Ht 5' 6 (1.676 m)   Wt 163 lb 3.2 oz (74 kg)   LMP 07/22/2013   BMI 26.34 kg/m    Physical Exam Vitals and nursing note reviewed.  Constitutional:      Appearance: She is well-developed.  HENT:     Head: Normocephalic and atraumatic.  Eyes:     Conjunctiva/sclera: Conjunctivae normal.  Cardiovascular:     Rate and Rhythm: Normal rate and regular rhythm.     Heart sounds: Normal heart sounds.  Pulmonary:     Effort: Pulmonary effort is normal.     Breath sounds: Normal breath sounds.  Abdominal:     General: Bowel sounds are normal.     Palpations: Abdomen is soft.  Musculoskeletal:     Cervical back: Normal range of motion.  Lymphadenopathy:     Cervical: No cervical adenopathy.  Skin:    General: Skin is warm and dry.     Capillary Refill: Capillary refill takes less  than 2 seconds.  Neurological:     Mental Status: She is alert and oriented to person, place, and time.  Psychiatric:        Behavior: Behavior normal.      Musculoskeletal Exam: She had good range of motion of cervical, thoracic and lumbar spine.  Right shoulder joint abduction and forward flexion was limited to 90 degrees.  She had limited internal rotation.  She had tenderness over the subacromial region and also the right bicipital tendon.  Left shoulder joint was in full range of motion.  Elbow joints, wrist joints, MCPs PIPs and DIPs were in good range of motion with no synovitis.  Hip joints and knee joints in good range of motion without any warmth swelling or effusion.  There was no tenderness over ankles or MTPs.  CDAI Exam: CDAI Score: --  Patient Global: --; Provider Global: -- Swollen: --; Tender: -- Joint Exam 06/18/2024   No joint exam has been documented for this visit   There is currently no information documented on the homunculus. Go to the Rheumatology activity and complete the homunculus joint exam.  Investigation: No additional findings.  Imaging: No results found.  Recent Labs: Lab Results  Component Value Date   WBC 5.4 03/04/2024   HGB 14.0 03/04/2024   PLT 269 03/04/2024   NA 141 03/04/2024   K 4.0 03/04/2024   CL 106 03/04/2024   CO2 28 03/04/2024   GLUCOSE 96 03/04/2024   BUN 13 03/04/2024   CREATININE 0.78 03/04/2024   BILITOT 0.6 03/04/2024   ALKPHOS 80 11/21/2022   AST 17 03/04/2024   ALT 15 03/04/2024   PROT 6.4 03/04/2024   ALBUMIN 4.6 11/21/2022   CALCIUM 9.7 03/04/2024   GFRAA 86 07/01/2020   QFTBGOLDPLUS NEGATIVE 06/11/2023    Speciality Comments: Plaquenil  June 11, 2023  PLQ Eye Exam 12/05/2023 WNL @ [facility] follow up 1 year  Procedures:  No procedures performed Allergies: Patient has no known allergies.   Assessment / Plan:     Visit Diagnoses: Rheumatoid arthritis with rheumatoid factor of multiple sites without  organ or systems involvement (HCC) - + RF, + anti-CCP, - ANA: She has not had a flare of rheumatoid arthritis.  She continues to be on Plaquenil  200 mg twice daily Monday to Friday without any side effects.  No synovitis was noted on the examination today.  High risk medication use - Plaquenil  200 mg 1 tablet by mouth twice daily Monday to Friday. PLQ Eye Exam 12/05/2023 -March 04, 2024 CBC and CMP were normal.  Will check labs today.  Plan: CBC with Differential/Platelet, Comprehensive metabolic panel with GFR  Chronic pain of both shoulders -she has been having increased pain and discomfort in her right shoulder joint.  Patient states she has been blowing leaves and also cutting weeds.  She had tenderness over the right subacromial region and also right bicipital region.  I offered cortisone injection but she declined.  I also offered physical therapy which she declined.  I offered referral to orthopedics but she would like to hold off.  Patient requested prednisone  taper.  Side effects of prednisone  use were discussed.  After informed consent was obtained and side effects were discussed prescription for prednisone  was sent.  X-rays of both shoulders were unremarkable on 06/11/2023. - Plan: predniSONE  (DELTASONE ) 5 MG tablet 4 tablets p.o. daily for 4 days and then taper by 5 mg every 4 days.  Primary osteoarthritis of both hands -she had no synovitis on the examination.  X-rays of both hands were consistent with osteoarthritis.  No erosive changes.  Primary osteoarthritis of both feet -she had no tenderness on the examination of her ankles or MTPs.  X-rays of both feet were consistent with osteoarthritis.  No recent changes were noted.  Sicca syndrome - Dry mouth.  SSA negative, SSB negative. Improved since starting plaquenil .  Age-related osteoporosis without current pathological fracture-10/25/2022 DEXA scan left femoral neck T-score -2.6, BMD 0.670.  I again discussed the need to start on  bisphosphonates.  Patient wants to hold off.  I gave her information on alendronate to review.  She states that she stopped taking vitamin D .  Need for taking vitamin D  on a regular basis and calcium was discussed.  Need for regular exercise was discussed.  Vitamin D  deficiency -I will check vitamin D   level today.  Other medical problems are listed as follows: Plan: VITAMIN D  25 Hydroxy (Vit-D Deficiency, Fractures)  Paroxysmal atrial fibrillation (HCC)  Paroxysmal supraventricular tachycardia  Primary insomnia  Mixed hyperlipidemia  Gastroesophageal reflux disease without esophagitis  Anxiety  Former smoker  Family history of osteoporosis  Orders: Orders Placed This Encounter  Procedures   CBC with Differential/Platelet   Comprehensive metabolic panel with GFR   VITAMIN D  25 Hydroxy (Vit-D Deficiency, Fractures)   Meds ordered this encounter  Medications   predniSONE  (DELTASONE ) 5 MG tablet    Sig: Take 4 tabs po x 4 days, 3  tabs po x 4 days, 2  tabs po x 4 days, 1  tab po x 4 days    Dispense:  40 tablet    Refill:  0     Follow-Up Instructions: Return in about 3 months (around 09/18/2024) for Rheumatoid arthritis.   Maya Nash, MD  Note - This record has been created using Animal nutritionist.  Chart creation errors have been sought, but may not always  have been located. Such creation errors do not reflect on  the standard of medical care.

## 2024-06-18 ENCOUNTER — Ambulatory Visit: Attending: Physician Assistant | Admitting: Rheumatology

## 2024-06-18 ENCOUNTER — Encounter: Payer: Self-pay | Admitting: Rheumatology

## 2024-06-18 VITALS — BP 105/72 | HR 87 | Temp 98.4°F | Resp 14 | Ht 66.0 in | Wt 163.2 lb

## 2024-06-18 DIAGNOSIS — M19042 Primary osteoarthritis, left hand: Secondary | ICD-10-CM

## 2024-06-18 DIAGNOSIS — M19071 Primary osteoarthritis, right ankle and foot: Secondary | ICD-10-CM

## 2024-06-18 DIAGNOSIS — M19041 Primary osteoarthritis, right hand: Secondary | ICD-10-CM | POA: Diagnosis not present

## 2024-06-18 DIAGNOSIS — Z87891 Personal history of nicotine dependence: Secondary | ICD-10-CM

## 2024-06-18 DIAGNOSIS — M25511 Pain in right shoulder: Secondary | ICD-10-CM

## 2024-06-18 DIAGNOSIS — M25512 Pain in left shoulder: Secondary | ICD-10-CM

## 2024-06-18 DIAGNOSIS — M19072 Primary osteoarthritis, left ankle and foot: Secondary | ICD-10-CM

## 2024-06-18 DIAGNOSIS — Z79899 Other long term (current) drug therapy: Secondary | ICD-10-CM

## 2024-06-18 DIAGNOSIS — F5101 Primary insomnia: Secondary | ICD-10-CM

## 2024-06-18 DIAGNOSIS — M3500 Sicca syndrome, unspecified: Secondary | ICD-10-CM

## 2024-06-18 DIAGNOSIS — K219 Gastro-esophageal reflux disease without esophagitis: Secondary | ICD-10-CM

## 2024-06-18 DIAGNOSIS — G8929 Other chronic pain: Secondary | ICD-10-CM

## 2024-06-18 DIAGNOSIS — F419 Anxiety disorder, unspecified: Secondary | ICD-10-CM

## 2024-06-18 DIAGNOSIS — Z8262 Family history of osteoporosis: Secondary | ICD-10-CM

## 2024-06-18 DIAGNOSIS — E559 Vitamin D deficiency, unspecified: Secondary | ICD-10-CM

## 2024-06-18 DIAGNOSIS — M0579 Rheumatoid arthritis with rheumatoid factor of multiple sites without organ or systems involvement: Secondary | ICD-10-CM | POA: Diagnosis not present

## 2024-06-18 DIAGNOSIS — M810 Age-related osteoporosis without current pathological fracture: Secondary | ICD-10-CM

## 2024-06-18 DIAGNOSIS — I471 Supraventricular tachycardia, unspecified: Secondary | ICD-10-CM

## 2024-06-18 DIAGNOSIS — I480 Paroxysmal atrial fibrillation: Secondary | ICD-10-CM

## 2024-06-18 DIAGNOSIS — E782 Mixed hyperlipidemia: Secondary | ICD-10-CM

## 2024-06-18 MED ORDER — PREDNISONE 5 MG PO TABS: ORAL_TABLET | ORAL | 0 refills | Status: AC

## 2024-06-18 NOTE — Patient Instructions (Addendum)
 Shoulder Exercises Ask your health care provider which exercises are safe for you. Do exercises exactly as told by your health care provider and adjust them as directed. It is normal to feel mild stretching, pulling, tightness, or discomfort as you do these exercises. Stop right away if you feel sudden pain or your pain gets worse. Do not begin these exercises until told by your health care provider. Stretching exercises External rotation and abduction This exercise is sometimes called corner stretch. The exercise rotates your arm outward (external rotation) and moves your arm out from your body (abduction). Stand in a doorway with one of your feet slightly in front of the other. This is called a staggered stance. If you cannot reach your forearms to the door frame, stand facing a corner of a room. Choose one of the following positions as told by your health care provider: Place your hands and forearms on the door frame above your head. Place your hands and forearms on the door frame at the height of your head. Place your hands on the door frame at the height of your elbows. Slowly move your weight onto your front foot until you feel a stretch across your chest and in the front of your shoulders. Keep your head and chest upright and keep your abdominal muscles tight. Hold for __________ seconds. To release the stretch, shift your weight to your back foot. Repeat __________ times. Complete this exercise __________ times a day. Extension, standing  Stand and hold a broomstick, a cane, or a similar object behind your back. Your hands should be a little wider than shoulder-width apart. Your palms should face away from your back. Keeping your elbows straight and your shoulder muscles relaxed, move the stick away from your body until you feel a stretch in your shoulders (extension). Avoid shrugging your shoulders while you move the stick. Keep your shoulder blades tucked down toward the middle of your  back. Hold for __________ seconds. Slowly return to the starting position. Repeat __________ times. Complete this exercise __________ times a day. Range-of-motion exercises Pendulum  Stand near a wall or a surface that you can hold onto for balance. Bend at the waist and let your left / right arm hang straight down. Use your other arm to support you. Keep your back straight and do not lock your knees. Relax your left / right arm and shoulder muscles, and move your hips and your trunk so your left / right arm swings freely. Your arm should swing because of the motion of your body, not because you are using your arm or shoulder muscles. Keep moving your hips and trunk so your arm swings in the following directions, as told by your health care provider: Side to side. Forward and backward. In clockwise and counterclockwise circles. Continue each motion for __________ seconds, or for as long as told by your health care provider. Slowly return to the starting position. Repeat __________ times. Complete this exercise __________ times a day. Shoulder flexion, standing  Stand and hold a broomstick, a cane, or a similar object. Place your hands a little more than shoulder-width apart on the object. Your left / right hand should be palm-up, and your other hand should be palm-down. Keep your elbow straight and your shoulder muscles relaxed. Push the stick up with your healthy arm to raise your left / right arm in front of your body, and then over your head until you feel a stretch in your shoulder (flexion). Avoid shrugging your shoulder  while you raise your arm. Keep your shoulder blade tucked down toward the middle of your back. Hold for __________ seconds. Slowly return to the starting position. Repeat __________ times. Complete this exercise __________ times a day. Shoulder abduction, standing  Stand and hold a broomstick, a cane, or a similar object. Place your hands a little more than  shoulder-width apart on the object. Your left / right hand should be palm-up, and your other hand should be palm-down. Keep your elbow straight and your shoulder muscles relaxed. Push the object across your body toward your left / right side. Raise your left / right arm to the side of your body (abduction) until you feel a stretch in your shoulder. Do not raise your arm above shoulder height unless your health care provider tells you to do that. If directed, raise your arm over your head. Avoid shrugging your shoulder while you raise your arm. Keep your shoulder blade tucked down toward the middle of your back. Hold for __________ seconds. Slowly return to the starting position. Repeat __________ times. Complete this exercise __________ times a day. Internal rotation  Place your left / right hand behind your back, palm-up. Use your other hand to dangle an exercise band, a broomstick, or a similar object over your shoulder. Grasp the band with your left / right hand so you are holding on to both ends. Gently pull up on the band until you feel a stretch in the front of your left / right shoulder. The movement of your arm toward the center of your body is called internal rotation. Avoid shrugging your shoulder while you raise your arm. Keep your shoulder blade tucked down toward the middle of your back. Hold for __________ seconds. Release the stretch by letting go of the band and lowering your hands. Repeat __________ times. Complete this exercise __________ times a day. Strengthening exercises External rotation  Sit in a stable chair without armrests. Secure an exercise band to a stable object at elbow height on your left / right side. Place a soft object, such as a folded towel or a small pillow, between your left / right upper arm and your body to move your elbow about 4 inches (10 cm) away from your side. Hold the end of the exercise band so it is tight and there is no slack. Keeping your  elbow pressed against the soft object, slowly move your forearm out, away from your abdomen (external rotation). Keep your body steady so only your forearm moves. Hold for __________ seconds. Slowly return to the starting position. Repeat __________ times. Complete this exercise __________ times a day. Shoulder abduction  Sit in a stable chair without armrests, or stand up. Hold a __________ lb / kg weight in your left / right hand, or hold an exercise band with both hands. Start with your arms straight down and your left / right palm facing in, toward your body. Slowly lift your left / right hand out to your side (abduction). Do not lift your hand above shoulder height unless your health care provider tells you that this is safe. Keep your arms straight. Avoid shrugging your shoulder while you do this movement. Keep your shoulder blade tucked down toward the middle of your back. Hold for __________ seconds. Slowly lower your arm, and return to the starting position. Repeat __________ times. Complete this exercise __________ times a day. Shoulder extension  Sit in a stable chair without armrests, or stand up. Secure an exercise band to a  stable object in front of you so it is at shoulder height. Hold one end of the exercise band in each hand. Straighten your elbows and lift your hands up to shoulder height. Squeeze your shoulder blades together as you pull your hands down to the sides of your thighs (extension). Stop when your hands are straight down by your sides. Do not let your hands go behind your body. Hold for __________ seconds. Slowly return to the starting position. Repeat __________ times. Complete this exercise __________ times a day. Shoulder row  Sit in a stable chair without armrests, or stand up. Secure an exercise band to a stable object in front of you so it is at chest height. Hold one end of the exercise band in each hand. Position your palms so that your thumbs are  facing the ceiling (neutral position). Bend each of your elbows to a 90-degree angle (right angle) and keep your upper arms at your sides. Step back or move the chair back until the band is tight and there is no slack. Slowly pull your elbows back behind you. Hold for __________ seconds. Slowly return to the starting position. Repeat __________ times. Complete this exercise __________ times a day. Shoulder press-ups  Sit in a stable chair that has armrests. Sit upright, with your feet flat on the floor. Put your hands on the armrests so your elbows are bent and your fingers are pointing forward. Your hands should be about even with the sides of your body. Push down on the armrests and use your arms to lift yourself off the chair. Straighten your elbows and lift yourself up as much as you comfortably can. Move your shoulder blades down, and avoid letting your shoulders move up toward your ears. Keep your feet on the ground. As you get stronger, your feet should support less of your body weight as you lift yourself up. Hold for __________ seconds. Slowly lower yourself back into the chair. Repeat __________ times. Complete this exercise __________ times a day. Wall push-ups  Stand so you are facing a stable wall. Your feet should be about one arm-length away from the wall. Lean forward and place your palms on the wall at shoulder height. Keep your feet flat on the floor as you bend your elbows and lean forward toward the wall. Hold for __________ seconds. Straighten your elbows to push yourself back to the starting position. Repeat __________ times. Complete this exercise __________ times a day. This information is not intended to replace advice given to you by your health care provider. Make sure you discuss any questions you have with your health care provider. Document Revised: 09/19/2021 Document Reviewed: 09/19/2021 Elsevier Patient Education  2024 [employer]   Prednisone   Tablets What is this medication? PREDNISONE  (PRED ni sone) treats many conditions such as asthma, allergic reactions, arthritis, inflammatory bowel diseases, adrenal, and blood or bone marrow disorders. It works by decreasing inflammation, slowing down an overactive immune system, or replacing cortisol normally made in the body. Cortisol is a hormone that plays an important role in how the body responds to stress, illness, and injury. It belongs to a group of medications called steroids. This medicine may be used for other purposes; ask your health care provider or pharmacist if you have questions. COMMON BRAND NAME(S): Deltasone , Predone, Sterapred, Sterapred DS What should I tell my care team before I take this medication? They need to know if you have any of these conditions: Cushing's syndrome Diabetes Glaucoma Heart disease High  blood pressure Infection (especially a virus infection such as chickenpox, cold sores, or herpes) Kidney disease Liver disease Mental illness Myasthenia gravis Osteoporosis Seizures Stomach or intestine problems Thyroid  disease An unusual or allergic reaction to lactose, prednisone , other medications, foods, dyes, or preservatives Pregnant or trying to get pregnant Breast-feeding How should I use this medication? Take this medication by mouth with a glass of water. Follow the directions on the prescription label. Take this medication with food. If you are taking this medication once a day, take it in the morning. Do not take more medication than you are told to take. Do not suddenly stop taking your medication because you may develop a severe reaction. Your care team will tell you how much medication to take. If your care team wants you to stop the medication, the dose may be slowly lowered over time to avoid any side effects. Talk to your care team about the use of this medication in children. Special care may be needed. Overdosage: If you think you have  taken too much of this medicine contact a poison control center or emergency room at once. NOTE: This medicine is only for you. Do not share this medicine with others. What if I miss a dose? If you miss a dose, take it as soon as you can. If it is almost time for your next dose, talk to your care team. You may need to miss a dose or take an extra dose. Do not take double or extra doses without advice. What may interact with this medication? Do not take this medication with any of the following: Metyrapone Mifepristone This medication may also interact with the following: Aminoglutethimide Amphotericin B Aspirin  and aspirin -like medications Barbiturates Certain medications for diabetes, like glipizide or glyburide Cholestyramine Cholinesterase inhibitors Cyclosporine Digoxin Diuretics Ephedrine Female hormones, like estrogens and birth control pills Isoniazid  Ketoconazole NSAIDS, medications for pain and inflammation, like ibuprofen or naproxen Phenytoin Rifampin Toxoids Vaccines Warfarin This list may not describe all possible interactions. Give your health care provider a list of all the medicines, herbs, non-prescription drugs, or dietary supplements you use. Also tell them if you smoke, drink alcohol, or use illegal drugs. Some items may interact with your medicine. What should I watch for while using this medication? Visit your care team for regular checks on your progress. If you are taking this medication over a prolonged period, carry an identification card with your name and address, the type and dose of your medication, and your care team's name and address. This medication may increase your risk of getting an infection. Tell your care team if you are around anyone with measles or chickenpox, or if you develop sores or blisters that do not heal properly. If you are going to have surgery, tell your care team that you have taken this medication within the last twelve  months. Ask your care team about your diet. You may need to lower the amount of salt you eat. This medication may increase blood sugar. Ask your care team if changes in diet or medications are needed if you have diabetes. What side effects may I notice from receiving this medication? Side effects that you should report to your care team as soon as possible: Allergic reactions--skin rash, itching, hives, swelling of the face, lips, tongue, or throat Cushing syndrome--increased fat around the midsection, upper back, neck, or face, pink or purple stretch marks on the skin, thinning, fragile skin that easily bruises, unexpected hair growth High blood sugar (hyperglycemia)--increased thirst  or amount of urine, unusual weakness or fatigue, blurry vision Increase in blood pressure Infection--fever, chills, cough, sore throat, wounds that don't heal, pain or trouble when passing urine, general feeling of discomfort or being unwell Low adrenal gland function--nausea, vomiting, loss of appetite, unusual weakness or fatigue, dizziness Mood and behavior changes--anxiety, nervousness, confusion, hallucinations, irritability, hostility, thoughts of suicide or self-harm, worsening mood, feelings of depression Stomach bleeding--bloody or black, tar-like stools, vomiting blood or brown material that looks like coffee grounds Swelling of the ankles, hands, or feet Side effects that usually do not require medical attention (report to your care team if they continue or are bothersome): Acne General discomfort and fatigue Headache Increase in appetite Nausea Trouble sleeping Weight gain This list may not describe all possible side effects. Call your doctor for medical advice about side effects. You may report side effects to FDA at 1-800-FDA-1088. Where should I keep my medication? Keep out of the reach of children. Store at room temperature between 15 and 30 degrees C (59 and 86 degrees F). Protect from light.  Keep container tightly closed. Throw away any unused medication after the expiration date. NOTE: This sheet is a summary. It may not cover all possible information. If you have questions about this medicine, talk to your doctor, pharmacist, or health care provider.  2024 Elsevier/Gold Standard (2020-10-28 00:00:00)  Alendronate Tablets What is this medication? ALENDRONATE (a LEN droe nate) prevents and treats osteoporosis. It may also be used to treat Paget disease of the bone. It works by interior and spatial designer stronger and less likely to break (fracture). It belongs to a group of medications called bisphosphonates. This medicine may be used for other purposes; ask your health care provider or pharmacist if you have questions. COMMON BRAND NAME(S): Fosamax What should I tell my care team before I take this medication? They need to know if you have any of these conditions: Bleeding disorder Cancer Dental disease Difficulty swallowing Infection (fever, chills, cough, sore throat, pain or trouble passing urine) Kidney disease Low levels of calcium or other minerals in the blood Low red blood cell counts Receiving steroids like dexamethasone or prednisone  Stomach or intestine problems Trouble sitting or standing for 30 minutes An unusual or allergic reaction to alendronate, other medications, foods, dyes or preservatives Pregnant or trying to get pregnant Breast-feeding How should I use this medication? Take this medication by mouth with a full glass of water. Take it as directed on the prescription label at the same time every day. Take the dose right after waking up. Do not eat or drink anything before taking it. Do not take it with any other drink except water. Do not chew or crush the tablet. After taking it, do not eat breakfast, drink, or take any other medications or vitamins for at least 30 minutes. Sit or stand up for at least 30 minutes after you take it. Do not lie down. Keep taking it  unless your care team tells you to stop. A special MedGuide will be given to you by the pharmacist with each prescription and refill. Be sure to read this information carefully each time. Talk to your care team about the use of this medication in children. Special care may be needed. Overdosage: If you think you have taken too much of this medicine contact a poison control center or emergency room at once. NOTE: This medicine is only for you. Do not share this medicine with others. What if I miss a dose? If you  take your medication once a day, skip it. Take your next dose at the scheduled time the next morning. Do not take two doses on the same day. If you take your medication once a week, take the missed dose on the morning after you remember. Do not take two doses on the same day. What may interact with this medication? Aluminum hydroxide Antacids Aspirin  Calcium supplements Medications for inflammation like ibuprofen, naproxen, and others Iron supplements Magnesium supplements Vitamins with minerals This list may not describe all possible interactions. Give your health care provider a list of all the medicines, herbs, non-prescription drugs, or dietary supplements you use. Also tell them if you smoke, drink alcohol, or use illegal drugs. Some items may interact with your medicine. What should I watch for while using this medication? Visit your care team for regular checks on your progress. It may be some time before you see the benefit from this medication. Some people who take this medication have severe bone, joint, or muscle pain. This medication may also increase your risk for jaw problems or a broken thigh bone. Tell your care team right away if you have severe pain in your jaw, bones, joints, or muscles. Tell you care team if you have any pain that does not go away or that gets worse. Tell your dentist and dental surgeon that you are taking this medication. You should not have major  dental surgery while on this medication. See your dentist to have a dental exam and fix any dental problems before starting this medication. Take good care of your teeth while on this medication. Make sure you see your dentist for regular follow-up appointments. You should make sure you get enough calcium and vitamin D  while you are taking this medication. Discuss the foods you eat and the vitamins you take with your care team. You may need blood work done while you are taking this medication. What side effects may I notice from receiving this medication? Side effects that you should report to your care team as soon as possible: Allergic reactions--skin rash, itching, hives, swelling of the face, lips, tongue, or throat Low calcium level--muscle pain or cramps, confusion, tingling, or numbness in the hands or feet Osteonecrosis of the jaw--pain, swelling, or redness in the mouth, numbness of the jaw, poor healing after dental work, unusual discharge from the mouth, visible bones in the mouth Pain or trouble swallowing Severe bone, joint, or muscle pain Stomach bleeding--bloody or black, tar-like stools, vomiting blood or brown material that looks like coffee grounds Side effects that usually do not require medical attention (report to your care team if they continue or are bothersome): Constipation Diarrhea Nausea Stomach pain This list may not describe all possible side effects. Call your doctor for medical advice about side effects. You may report side effects to FDA at 1-800-FDA-1088. Where should I keep my medication? Keep out of the reach of children and pets. Store at room temperature between 15 and 30 degrees C (59 and 86 degrees F). Throw away any unused medication after the expiration date. NOTE: This sheet is a summary. It may not cover all possible information. If you have questions about this medicine, talk to your doctor, pharmacist, or health care provider.  2024 Elsevier/Gold  Standard (2020-08-11 00:00:00)

## 2024-06-19 ENCOUNTER — Ambulatory Visit: Payer: Self-pay | Admitting: Rheumatology

## 2024-06-19 LAB — CBC WITH DIFFERENTIAL/PLATELET
Absolute Lymphocytes: 1702 {cells}/uL (ref 850–3900)
Absolute Monocytes: 704 {cells}/uL (ref 200–950)
Basophils Absolute: 60 {cells}/uL (ref 0–200)
Basophils Relative: 0.9 %
Eosinophils Absolute: 134 {cells}/uL (ref 15–500)
Eosinophils Relative: 2 %
HCT: 42.3 % (ref 35.0–45.0)
Hemoglobin: 14.5 g/dL (ref 11.7–15.5)
MCH: 31.9 pg (ref 27.0–33.0)
MCHC: 34.3 g/dL (ref 32.0–36.0)
MCV: 93.2 fL (ref 80.0–100.0)
MPV: 10.1 fL (ref 7.5–12.5)
Monocytes Relative: 10.5 %
Neutro Abs: 4100 {cells}/uL (ref 1500–7800)
Neutrophils Relative %: 61.2 %
Platelets: 292 Thousand/uL (ref 140–400)
RBC: 4.54 Million/uL (ref 3.80–5.10)
RDW: 11.8 % (ref 11.0–15.0)
Total Lymphocyte: 25.4 %
WBC: 6.7 Thousand/uL (ref 3.8–10.8)

## 2024-06-19 LAB — COMPREHENSIVE METABOLIC PANEL WITH GFR
AG Ratio: 2 (calc) (ref 1.0–2.5)
ALT: 12 U/L (ref 6–29)
AST: 15 U/L (ref 10–35)
Albumin: 4.3 g/dL (ref 3.6–5.1)
Alkaline phosphatase (APISO): 67 U/L (ref 37–153)
BUN: 21 mg/dL (ref 7–25)
CO2: 27 mmol/L (ref 20–32)
Calcium: 9.3 mg/dL (ref 8.6–10.4)
Chloride: 105 mmol/L (ref 98–110)
Creat: 0.92 mg/dL (ref 0.50–1.05)
Globulin: 2.2 g/dL (ref 1.9–3.7)
Glucose, Bld: 96 mg/dL (ref 65–99)
Potassium: 4 mmol/L (ref 3.5–5.3)
Sodium: 141 mmol/L (ref 135–146)
Total Bilirubin: 0.7 mg/dL (ref 0.2–1.2)
Total Protein: 6.5 g/dL (ref 6.1–8.1)
eGFR: 70 mL/min/1.73m2 (ref >=60)

## 2024-06-19 LAB — VITAMIN D 25 HYDROXY (VIT D DEFICIENCY, FRACTURES): Vit D, 25-Hydroxy: 32 ng/mL (ref 30–100)

## 2024-06-19 NOTE — Progress Notes (Signed)
 CBC and CMP are normal.  Vitamin D  is low normal.  Patient should continue to take vitamin D .

## 2024-06-21 ENCOUNTER — Other Ambulatory Visit: Payer: Self-pay | Admitting: Internal Medicine

## 2024-06-24 LAB — HM MAMMOGRAPHY

## 2024-07-08 ENCOUNTER — Other Ambulatory Visit: Payer: Self-pay

## 2024-07-27 ENCOUNTER — Ambulatory Visit: Admitting: Physician Assistant

## 2024-08-30 ENCOUNTER — Other Ambulatory Visit: Payer: Self-pay | Admitting: Physician Assistant

## 2024-08-30 DIAGNOSIS — M0579 Rheumatoid arthritis with rheumatoid factor of multiple sites without organ or systems involvement: Secondary | ICD-10-CM

## 2024-08-30 DIAGNOSIS — Z79899 Other long term (current) drug therapy: Secondary | ICD-10-CM

## 2024-08-31 NOTE — Telephone Encounter (Signed)
 Last Fill: 06/07/2027  Eye exam: 12/05/2023 WNL    Labs: 06/18/2024 CBC and CMP are normal.   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   Okay to refill Plaquenil ?

## 2024-10-22 ENCOUNTER — Ambulatory Visit: Admitting: Rheumatology

## 2025-01-14 ENCOUNTER — Ambulatory Visit: Payer: Self-pay | Admitting: Obstetrics and Gynecology
